# Patient Record
Sex: Female | Born: 1935 | Race: White | Hispanic: No | Smoking: Never smoker
Health system: Southern US, Community
[De-identification: ages and names within clinical notes are randomized; demographics above are authoritative.]

## PROBLEM LIST (undated history)

## (undated) DIAGNOSIS — H409 Unspecified glaucoma: Secondary | ICD-10-CM

## (undated) DIAGNOSIS — E119 Type 2 diabetes mellitus without complications: Secondary | ICD-10-CM

## (undated) DIAGNOSIS — E785 Hyperlipidemia, unspecified: Secondary | ICD-10-CM

## (undated) DIAGNOSIS — I509 Heart failure, unspecified: Secondary | ICD-10-CM

## (undated) DIAGNOSIS — I219 Acute myocardial infarction, unspecified: Secondary | ICD-10-CM

## (undated) DIAGNOSIS — M1990 Unspecified osteoarthritis, unspecified site: Secondary | ICD-10-CM

## (undated) DIAGNOSIS — C801 Malignant (primary) neoplasm, unspecified: Secondary | ICD-10-CM

## (undated) DIAGNOSIS — N189 Chronic kidney disease, unspecified: Secondary | ICD-10-CM

## (undated) DIAGNOSIS — J189 Pneumonia, unspecified organism: Secondary | ICD-10-CM

## (undated) DIAGNOSIS — I10 Essential (primary) hypertension: Secondary | ICD-10-CM

## (undated) DIAGNOSIS — R51 Headache: Secondary | ICD-10-CM

## (undated) DIAGNOSIS — R0602 Shortness of breath: Secondary | ICD-10-CM

## (undated) DIAGNOSIS — Z1231 Encounter for screening mammogram for malignant neoplasm of breast: Secondary | ICD-10-CM

## (undated) DIAGNOSIS — Z1239 Encounter for other screening for malignant neoplasm of breast: Secondary | ICD-10-CM

## (undated) DIAGNOSIS — Z780 Asymptomatic menopausal state: Secondary | ICD-10-CM

## (undated) DIAGNOSIS — D0510 Intraductal carcinoma in situ of unspecified breast: Secondary | ICD-10-CM

## (undated) DIAGNOSIS — C50919 Malignant neoplasm of unspecified site of unspecified female breast: Secondary | ICD-10-CM

## (undated) DIAGNOSIS — M79604 Pain in right leg: Secondary | ICD-10-CM

## (undated) DIAGNOSIS — N289 Disorder of kidney and ureter, unspecified: Secondary | ICD-10-CM

## (undated) DIAGNOSIS — R100 Acute abdomen: Secondary | ICD-10-CM

## (undated) DIAGNOSIS — M25362 Other instability, left knee: Secondary | ICD-10-CM

## (undated) DIAGNOSIS — R600 Localized edema: Secondary | ICD-10-CM

## (undated) HISTORY — PX: JOINT REPLACEMENT: SHX530

## (undated) HISTORY — PX: CORONARY ARTERY BYPASS GRAFT: SHX141

## (undated) HISTORY — PX: TOTAL HIP ARTHROPLASTY: SHX124

## (undated) HISTORY — PX: CHOLECYSTECTOMY: SHX55

## (undated) HISTORY — PX: OTHER SURGICAL HISTORY: SHX169

## (undated) HISTORY — PX: ABDOMINAL HYSTERECTOMY: SHX81

---

## 1994-09-23 DIAGNOSIS — I219 Acute myocardial infarction, unspecified: Secondary | ICD-10-CM

## 1994-09-23 HISTORY — DX: Acute myocardial infarction, unspecified: I21.9

## 2009-06-20 LAB — BASIC METABOLIC PANEL
BUN: 30 mg/dL — ABNORMAL HIGH (ref 6–23)
CO2: 33 mMol/L — ABNORMAL HIGH (ref 20–32)
Calcium: 9.7 mg/dL (ref 8.3–10.6)
Chloride: 103 mMol/L (ref 99–109)
Creatinine: 2 mg/dL — ABNORMAL HIGH (ref 0.6–1.1)
GFR African American: 29 mL/min/{1.73_m2}
GFR Non-African American: 24 mL/min/{1.73_m2}
Glucose: 223 mg/dL — ABNORMAL HIGH (ref 74–106)
Potassium: 4.3 mmol/L (ref 3.5–5.1)
Sodium: 141 mmol/L (ref 136–145)

## 2009-07-03 LAB — SEDIMENTATION RATE, MANUAL: Sed Rate, Automated: 11 mm/h (ref 0–30)

## 2009-07-05 LAB — CYCLIC CITRUL PEPTIDE ANTIBODY, IGG: CC Peptide,IgG Ab: 1

## 2009-07-05 LAB — RHEUMATOID FACTOR: Rheumatoid Factor: NEGATIVE

## 2009-09-25 LAB — BASIC METABOLIC PANEL
BUN: 29 mg/dL — ABNORMAL HIGH (ref 6–23)
CO2: 32 mMol/L (ref 20–32)
Calcium: 9.3 mg/dL (ref 8.3–10.6)
Chloride: 104 mMol/L (ref 99–109)
Creatinine: 1.4 mg/dL — ABNORMAL HIGH (ref 0.6–1.1)
GFR African American: 44 mL/min/{1.73_m2}
GFR Non-African American: 36 mL/min/{1.73_m2}
Glucose: 167 mg/dL — ABNORMAL HIGH (ref 74–106)
Potassium: 4 mmol/L (ref 3.5–5.1)
Sodium: 142 mmol/L (ref 136–145)

## 2009-10-23 ENCOUNTER — Inpatient Hospital Stay
Admit: 2009-10-23 | Disposition: A | Discharge status: Home / Self Care | Source: Other Acute Inpatient Hospital | Attending: Internal Medicine | Admitting: Internal Medicine

## 2009-10-23 LAB — CBC WITH AUTO DIFFERENTIAL
Basophils %: 3.5 % — ABNORMAL HIGH (ref 0–1)
Basophils Absolute: 0.2 10*3/uL — ABNORMAL HIGH (ref 0.0–0.1)
Eosinophils %: 2.8 % (ref 0–7)
Eosinophils Absolute: 0.2 10*3/uL (ref 0.0–0.6)
Hematocrit: 36 % — ABNORMAL LOW (ref 37.0–47.0)
Hemoglobin: 11.9 g/dL — ABNORMAL LOW (ref 12.0–16.0)
Lymphocytes %: 16.5 % (ref 16–42)
Lymphocytes Absolute: 1 10*3/uL (ref 0.6–4.3)
MCH: 31.9 pg — ABNORMAL HIGH (ref 27–31)
MCHC: 32.9 % (ref 32.0–36.0)
MCV: 97 FL (ref 80–100)
MPV: 9.9 FL (ref 7.5–11.1)
Monocytes %: 8 % (ref 4–12)
Monocytes Absolute: 0.5 10*3/uL (ref 0.2–1.1)
Neutrophils Absolute: 4.4 10*3/uL (ref 1.5–7.0)
Platelets: 155 10*3/uL (ref 150–400)
RBC: 3.71 10*6/uL — ABNORMAL LOW (ref 4.20–5.40)
RDW: 13.1 % (ref 11.7–14.9)
Segs Relative: 69.2 % (ref 44–76)
WBC: 6.3 10*3/uL (ref 4.8–10.8)

## 2009-10-23 LAB — COMPREHENSIVE METABOLIC PANEL
ALT: 14 U/L (ref 7–35)
AST: 50 U/L — ABNORMAL HIGH (ref 8–33)
Albumin,Serum: 4.2 g/dL (ref 3.4–4.8)
Alkaline Phosphatase: 50 U/L (ref 25–100)
BUN: 33 mg/dL — ABNORMAL HIGH (ref 6–23)
CO2: 29 mMol/L (ref 20–32)
Calcium: 9 mg/dL (ref 8.3–10.6)
Chloride: 103 mMol/L (ref 99–109)
Creatinine: 1.5 mg/dL — ABNORMAL HIGH (ref 0.6–1.1)
GFR African American: 41 mL/min/{1.73_m2}
GFR Non-African American: 34 mL/min/{1.73_m2}
Glucose, GTT - Fasting: 113 mg/dL — ABNORMAL HIGH (ref 50–99)
Potassium: 5 mmol/L (ref 3.5–5.1)
Sodium: 140 mmol/L (ref 136–145)
Total Bilirubin: 0.5 mg/dL (ref 0.3–1.2)
Total Protein: 6.5 g/dL (ref 6.4–8.3)

## 2009-10-23 LAB — TROPONIN
Troponin I: 0.009 ng/mL (ref 0.000–0.040)
Troponin I: 0.006 ng/mL (ref 0.000–0.040)

## 2009-10-24 LAB — POCT GLUCOSE
POC Glucose: 214 mg/dL — ABNORMAL HIGH (ref 70–105)
POC Glucose: 416 mg/dL — ABNORMAL HIGH (ref 70–105)
POC Glucose: 271 mg/dL — ABNORMAL HIGH (ref 70–105)
POC Glucose: 408 mg/dL — ABNORMAL HIGH (ref 70–105)

## 2009-10-24 LAB — CBC
Hematocrit: 32.4 % — ABNORMAL LOW (ref 37.0–47.0)
Hemoglobin: 10.8 g/dL — ABNORMAL LOW (ref 12.0–16.0)
MCH: 32.3 pg — ABNORMAL HIGH (ref 27–31)
MCHC: 33.2 % (ref 32.0–36.0)
MCV: 97.2 FL (ref 80–100)
MPV: 10.5 FL (ref 7.5–11.1)
Platelets: 125 10*3/uL — ABNORMAL LOW (ref 150–400)
RBC: 3.33 10*6/uL — ABNORMAL LOW (ref 4.20–5.40)
RDW: 14.4 % (ref 11.7–14.9)
WBC: 4.8 10*3/uL (ref 4.8–10.8)

## 2009-10-24 LAB — BASIC METABOLIC PANEL
BUN: 33 mg/dL — ABNORMAL HIGH (ref 6–23)
CO2: 29 mMol/L (ref 20–32)
Calcium: 9.2 mg/dL (ref 8.3–10.6)
Chloride: 106 mMol/L (ref 99–109)
Creatinine: 1.8 mg/dL — ABNORMAL HIGH (ref 0.6–1.1)
GFR African American: 33 mL/min/{1.73_m2}
GFR Non-African American: 27 mL/min/{1.73_m2}
Glucose: 140 mg/dL — ABNORMAL HIGH (ref 74–106)
Potassium: 4.2 mmol/L (ref 3.5–5.1)
Sodium: 140 mmol/L (ref 136–145)

## 2009-10-24 LAB — TROPONIN: Troponin I: 0.009 ng/mL (ref 0.000–0.040)

## 2009-10-24 LAB — PROTIME/INR & PTT
INR: 1.08 INDEX
Prothrombin Time: 11.8 s (ref 10–14.3)
aPTT: 29.4 s (ref 24–40)

## 2009-10-25 LAB — CBC
Hematocrit: 28.8 % — ABNORMAL LOW (ref 37.0–47.0)
Hemoglobin: 9.8 g/dL — ABNORMAL LOW (ref 12.0–16.0)
MCH: 32.9 pg — ABNORMAL HIGH (ref 27–31)
MCHC: 34.2 % (ref 32.0–36.0)
MCV: 96.3 FL (ref 80–100)
MPV: 10.8 FL (ref 7.5–11.1)
Platelets: 118 10*3/uL — ABNORMAL LOW (ref 150–400)
RBC: 2.99 10*6/uL — ABNORMAL LOW (ref 4.20–5.40)
RDW: 14.4 % (ref 11.7–14.9)
WBC: 12.2 10*3/uL — ABNORMAL HIGH (ref 4.8–10.8)

## 2009-10-25 LAB — POCT GLUCOSE
POC Glucose: 459 mg/dL — ABNORMAL HIGH (ref 70–105)
POC Glucose: 273 mg/dL — ABNORMAL HIGH (ref 70–105)
POC Glucose: 127 mg/dL — ABNORMAL HIGH (ref 70–105)
POC Glucose: 313 mg/dL — ABNORMAL HIGH (ref 70–105)
POC Glucose: 178 mg/dL — ABNORMAL HIGH (ref 70–105)

## 2009-10-25 LAB — BASIC METABOLIC PANEL
BUN: 37 mg/dL — ABNORMAL HIGH (ref 6–23)
CO2: 30 mMol/L (ref 20–32)
Calcium: 8.8 mg/dL (ref 8.3–10.6)
Chloride: 106 mMol/L (ref 99–109)
Creatinine: 1.9 mg/dL — ABNORMAL HIGH (ref 0.6–1.1)
GFR African American: 31 mL/min/{1.73_m2}
GFR Non-African American: 26 mL/min/{1.73_m2}
Glucose: 101 mg/dL (ref 74–106)
Potassium: 4.5 mmol/L (ref 3.5–5.1)
Sodium: 139 mmol/L (ref 136–145)

## 2009-10-26 LAB — POCT GLUCOSE
POC Glucose: 143 mg/dL — ABNORMAL HIGH (ref 70–105)
POC Glucose: 222 mg/dL — ABNORMAL HIGH (ref 70–105)
POC Glucose: 77 mg/dL (ref 70–105)
POC Glucose: 125 mg/dL — ABNORMAL HIGH (ref 70–105)
POC Glucose: 222 mg/dL — ABNORMAL HIGH (ref 70–105)

## 2009-10-26 LAB — BASIC METABOLIC PANEL
BUN: 35 mg/dL — ABNORMAL HIGH (ref 6–23)
CO2: 28 mMol/L (ref 20–32)
Calcium: 8.6 mg/dL (ref 8.3–10.6)
Chloride: 109 mMol/L (ref 99–109)
Creatinine: 1.7 mg/dL — ABNORMAL HIGH (ref 0.6–1.1)
GFR African American: 35 mL/min/{1.73_m2}
GFR Non-African American: 29 mL/min/{1.73_m2}
Glucose: 61 mg/dL — ABNORMAL LOW (ref 74–106)
Potassium: 3.6 mmol/L (ref 3.5–5.1)
Sodium: 141 mmol/L (ref 136–145)

## 2009-10-27 LAB — POCT GLUCOSE
POC Glucose: 84 mg/dL (ref 70–105)
POC Glucose: 165 mg/dL — ABNORMAL HIGH (ref 70–105)
POC Glucose: 73 mg/dL (ref 70–105)

## 2009-10-28 LAB — POCT GLUCOSE: POC Glucose: 80 mg/dL (ref 70–105)

## 2009-11-07 LAB — BASIC METABOLIC PANEL
BUN: 28 mg/dL — ABNORMAL HIGH (ref 6–23)
CO2: 31 mMol/L (ref 20–32)
Calcium: 9.1 mg/dL (ref 8.3–10.6)
Chloride: 101 mMol/L (ref 99–109)
Creatinine: 1.6 mg/dL — ABNORMAL HIGH (ref 0.6–1.1)
GFR African American: 38 mL/min/{1.73_m2}
GFR Non-African American: 31 mL/min/{1.73_m2}
Glucose: 254 mg/dL — ABNORMAL HIGH (ref 74–106)
Potassium: 4.3 mmol/L (ref 3.5–5.1)
Sodium: 141 mmol/L (ref 136–145)

## 2010-04-20 LAB — CBC WITH AUTO DIFFERENTIAL
Basophils %: 1.7 % — ABNORMAL HIGH (ref 0–1)
Basophils Absolute: 0.2 10*3/uL — ABNORMAL HIGH (ref 0.0–0.1)
Eosinophils %: 2 % (ref 0–7)
Eosinophils Absolute: 0.2 10*3/uL (ref 0.0–0.6)
Hematocrit: 35.2 % — ABNORMAL LOW (ref 37.0–47.0)
Hemoglobin: 12 GM/DL (ref 12.0–16.0)
Lymphocytes %: 8.7 % — ABNORMAL LOW (ref 16–42)
Lymphocytes Absolute: 0.8 10*3/uL (ref 0.6–4.3)
MCH: 32.5 PG — ABNORMAL HIGH (ref 27–31)
MCHC: 34.2 % (ref 32.0–36.0)
MCV: 95.2 FL (ref 80–100)
MPV: 9 FL (ref 7.5–11.1)
Monocytes %: 7.2 % (ref 4–12)
Monocytes Absolute: 0.6 10*3/uL (ref 0.2–1.1)
Platelets: 186 10*3/uL (ref 150–400)
RBC: 3.7 10*6/uL — ABNORMAL LOW (ref 4.20–5.40)
RDW: 12.1 % (ref 11.7–14.9)
Segs Absolute: 7.1 10*3/uL — ABNORMAL HIGH (ref 1.5–7.0)
Segs Relative: 80.4 % — ABNORMAL HIGH (ref 44–76)
WBC: 8.9 10*3/uL (ref 4.8–10.8)

## 2010-04-20 LAB — COMPREHENSIVE METABOLIC PANEL
ALT: 14 U/L (ref 7–35)
AST: 24 U/L (ref 8–33)
Albumin,Serum: 4.2 G/DL (ref 3.4–4.8)
Alkaline Phosphatase: 49 U/L (ref 25–100)
BUN: 30 mg/dL — ABNORMAL HIGH (ref 6–23)
Bilirubin, Total: 0.6 mg/dL (ref 0.3–1.2)
CO2: 27 mMol/L (ref 20–32)
Calcium: 9 mg/dL (ref 8.3–10.6)
Chloride: 101 mMol/L (ref 99–109)
Creatinine: 1.7 mg/dL — ABNORMAL HIGH (ref 0.6–1.1)
GFR African American: 35 mL/min/{1.73_m2}
GFR Non-African American: 29 mL/min/{1.73_m2}
Glucose, GTT - Fasting: 174 MG/DL — ABNORMAL HIGH (ref 50–99)
Potassium: 4.1 MMOL/L (ref 3.5–5.1)
Sodium: 137 mmol/L (ref 136–145)
Total Protein: 6.5 g/dL (ref 6.4–8.3)

## 2010-04-20 LAB — URINALYSIS
Bilirubin Urine: NEGATIVE MG/DL
Blood, Urine: NEGATIVE
Cast Type: NONE SEEN /LPF
Crystal Type: NONE SEEN /HPF
Epithelial Cells, UA: 3 /HPF
Glucose, Urine: NEGATIVE MG/DL
Ketones, Urine: NEGATIVE MG/DL
Leukocyte Esterase, Urine: NEGATIVE
Nitrite Urine, Quantitative: NEGATIVE
Protein, UA: NEGATIVE MG/DL
RBC, UA: NONE SEEN /HPF (ref 0–6)
Specific Gravity, UA: 1.025 (ref 1.001–1.035)
Urobilinogen, Urine: 0.2 EU/DL (ref 0.2–1.0)
Volume, (UVOL): 12 ML (ref 10–12)
WBC, UA: 2 /HPF (ref 0–5)
pH, Urine: 5.5 (ref 5.0–8.0)

## 2010-04-20 LAB — TROPONIN: Troponin I: 0.016 NG/ML (ref 0.000–0.040)

## 2010-04-20 LAB — CREATINE KINASE: Total CK: 45 U/L (ref 33–211)

## 2010-08-24 ENCOUNTER — Ambulatory Visit: Payer: Self-pay | Admitting: Family Medicine

## 2010-08-24 DIAGNOSIS — H409 Unspecified glaucoma: Secondary | ICD-10-CM | POA: Insufficient documentation

## 2010-08-24 DIAGNOSIS — I2510 Atherosclerotic heart disease of native coronary artery without angina pectoris: Secondary | ICD-10-CM | POA: Insufficient documentation

## 2010-08-24 DIAGNOSIS — I498 Other specified cardiac arrhythmias: Secondary | ICD-10-CM | POA: Insufficient documentation

## 2010-08-24 DIAGNOSIS — I10 Essential (primary) hypertension: Secondary | ICD-10-CM | POA: Insufficient documentation

## 2010-08-24 DIAGNOSIS — E785 Hyperlipidemia, unspecified: Secondary | ICD-10-CM | POA: Insufficient documentation

## 2010-08-24 DIAGNOSIS — E119 Type 2 diabetes mellitus without complications: Secondary | ICD-10-CM | POA: Insufficient documentation

## 2010-08-24 LAB — CONVERTED CEMR LAB: Blood Glucose, Fingerstick: 86

## 2010-08-25 ENCOUNTER — Encounter: Payer: Self-pay | Admitting: Family Medicine

## 2010-09-23 DIAGNOSIS — C801 Malignant (primary) neoplasm, unspecified: Secondary | ICD-10-CM

## 2010-09-23 HISTORY — DX: Malignant (primary) neoplasm, unspecified: C80.1

## 2010-10-23 NOTE — Letter (Signed)
Summary: recommendations from referral-dr. covert  recommendations from referral-dr. covert   Imported By: Rosine Beat 08/25/2010 13:43:15  _____________________________________________________________________  External Attachment:    Type:   Image     Comment:   External Document

## 2010-10-23 NOTE — Assessment & Plan Note (Signed)
Summary: POSSIBLE PINK EYE/JBB   Vital Signs:  Patient Profile:   77 Years Old Female CC:      Possible Pink Eye Height:     58 inches Weight:      124 pounds BMI:     26.01 O2 Sat:      97 % O2 treatment:    Room Air Temp:     97.0 degrees F oral Pulse rate:   52 / minute Pulse rhythm:   regular Resp:     20 per minute BP sitting:   159 / 60  (left arm)  Pt. in pain?   no  Vitals Entered By: Levonne Spiller EMT-P (August 24, 2010 1:33 PM)              Is Patient Diabetic? Yes  CBG Result 86  Does patient need assistance? Functional Status Self care Ambulation Normal     Serial Vital Signs/Assessments:  Time      Position  BP       Pulse  Resp  Temp     By 2:18pm              160/60   50     18             Clanford Johnson MD 2:28pm              170/56   52     18             Clanford Johnson MD 3:02pm              116/62   54     18             Clanford Johnson MD  Comments: We monitored the patient in the office continuously over the course of 2+ hours, we also monitored her blood glucose and pulse.  By: Standley Dakins MD    Current Allergies: ! * CELABREXHistory of Present Illness History from: patient Reason for visit: see chief complaint Chief Complaint: Possible Pink Eye History of Present Illness: Yesterday pt noticed that her left eye was red and it had some drainage.   the patient says that her daughter noticed initially that her eye was red. Mrs. the left eye that she has been experiencing these symptoms in. She reports that she has poor vision in the left eye from when she was a child. He reports that it was considered a lazy eye when she was a child and she was not able to get it corrected when she was a child. She reports that she thought that she was having a pink eye. She had a little bit of irritation in that eye but otherwise she reports that she felt no pain. She reports that she was diagnosed with glaucoma recently and has been taking eyedrops  regularly. She also has hypertension. She reports that her blood pressures have been regularly controlled but she did notice that for the past several days she has had higher systolic blood pressure readings. She reports that this morning her blood pressure was 151 systolic. She says that normally her systolic blood pressures are between 1:30 and 135. And she has diastolic pressure readings that are less than 70 on most occasions. She reports that she is taking several blood pressure medications as she reports that she does take them regularly. She did take her blood pressure medications this morning. She reports that she is visiting her daughter who lives  here in [facility] but she is from [place] and that's where her doctors are.the patient says that she has a cardiologist in [place]. She reports that in September of this year she had a full cardiac workup in [place]. She reports that she had a catheterization that was done. She says that her heart was okay. She reports that she has had intermittent chest pain but nothing recently. She has been in [place] since October of this year. She reports that she has not had any problem since then. The patient reports that she has not had any allergies or runny nose or coughing recently. She says that she is taking her meds for glaucoma. no eye pain.  REVIEW OF SYSTEMS Constitutional Symptoms       Complains of fatigue.     Denies fever, chills, night sweats, weight loss, and weight gain.  Eyes       Complains of eye drainage and glasses.      Denies change in vision, eye pain, contact lenses, and eye surgery. Ear/Nose/Throat/Mouth       Complains of hearing loss/aids, dizziness, and sinus problems.      Denies change in hearing, ear pain, ear discharge, frequent runny nose, frequent nose bleeds, sore throat, hoarseness, and tooth pain or bleeding.  Respiratory       Denies dry cough, productive cough, wheezing, shortness of breath, asthma,  bronchitis, and emphysema/COPD.  Cardiovascular       Complains of tires easily with exhertion.      Denies murmurs and chest pain.    Gastrointestinal       Complains of constipation.      Denies stomach pain, nausea/vomiting, diarrhea, blood in bowel movements, and indigestion. Genitourniary       Denies painful urination, kidney stones, and loss of urinary control. Neurological       Denies paralysis, seizures, and fainting/blackouts. Musculoskeletal       Complains of joint stiffness.      Denies muscle pain, joint pain, decreased range of motion, redness, swelling, muscle weakness, and gout.  Skin       Complains of bruising.      Denies unusual mles/lumps or sores and hair/skin or nail changes.  Psych       Denies mood changes, temper/anger issues, anxiety/stress, speech problems, depression, and sleep problems. Blood-Lymph       Complains of easily bruises or bleeds.  Past History:  Family History: Last updated: 08/24/2010 the patient reports that she had one sister that died of cancer. She reports that her parents died of old age.  Social History: Last updated: 08/24/2010 this patient lives in [place] but is currently in [place] visiting her daughter and will be here for approximately 6 months. She reports that she uses no alcohol, tobacco or recreational drugs.  Past Medical History: hypertension Hyperlipidemia Coronary artery disease status post three-vessel CABG Hyperlipidemia Glaucoma Osteoarthritis Diabetes mellitus type 2  Past Surgical History: three-vessel coronary artery bypass, 3 cesarean sections, 3 hip replacements, 1 new replacement and hysterectomy.  Family History: the patient reports that she had one sister that died of cancer. She reports that her parents died of old age.  Social History: this patient lives in [place] but is currently in [place] visiting her daughter and will be here for approximately 6 months. She reports that she uses no  alcohol, tobacco or recreational drugs. Physical Exam General appearance: well developed, well nourished, no acute distress Head: normocephalic, atraumatic Eyes:  left scleral irritation and subconjunctival hemorrhage, right eye OK Pupils: equal, round, reactive to light Ears: normal, no lesions or deformities Nasal: mucosa pink, nonedematous, no septal deviation, turbinates normal Oral/Pharynx: tongue normal, posterior pharynx without erythema or exudate Neck: neck supple,  trachea midline, no masses Thyroid: no nodules, masses, tenderness, or enlargement Chest/Lungs: no rales, wheezes, or rhonchi bilateral, breath sounds equal without effort Heart: regular rate and  rhythm, no murmur Abdomen: soft, non-tender without obvious organomegaly Extremities: normal extremities Neurological: grossly intact and non-focal Skin: no obvious rashes or lesions MSE: oriented to time, place, and person Assessment New Problems: SUBCONJUNCTIVAL HEMORRHAGE, LEFT (ICD-372.72) GLAUCOMA STAGE UNSPECIFIED (ICD-365.70) HYPERLIPIDEMIA (ICD-272.4) DIABETES MELLITUS, TYPE II, CONTROLLED (ICD-250.00) BRADYCARDIA (ICD-427.89) CORONARY ATHEROSCLEROSIS NATIVE CORONARY ARTERY (ICD-414.01) HYPERTENSION, ESSENTIAL, UNCONTROLLED (ICD-401.9)   Patient Education: Patient and/or caregiver instructed in the following: rest. The risks, benefits and possible side effects were clearly explained and discussed with the patient.  The patient verbalized clear understanding.  The patient was given instructions to return if symptoms don't improve, worsen or new changes develop.  If it is not during clinic hours and the patient cannot get back to this clinic then the patient was told to seek medical care at an available urgent care or emergency department.  The patient verbalized understanding.   Demonstrates willingness to comply.  Plan Planning Comments:   Recommended frequent blood pressure monitoring.  Also, recommended that  the patient go directly to the eye care specialist to be sure the glaucoma is controlled.  In addition, recommended patient avoid sodium and eat a meal.  I recommended that the patient increase her hydralazine 10 mg tabs to 3 tabs by mouth two times a day and monitor BP closely and follow up with her PCP and cardiologist to let them know about the rise in her blood pressure.  Also, I asked her to check her pulse and if her pulse drops below 50 I told her that she needed to seek medical care urgently because that could cause her to pass out.  The patient verbalized clear understanding.   Follow Up: Follow up in 2-3 days if no improvement, Follow up on an as needed basis, Follow up with Primary Physician Follow Up: see instructions as noted  The patient and/or caregiver has been counseled thoroughly with regard to medications prescribed including dosage, schedule, interactions, rationale for use, and possible side effects and they verbalize understanding.  Diagnoses and expected course of recovery discussed and will return if not improved as expected or if the condition worsens. Patient and/or caregiver verbalized understanding.   Patient Instructions: 1)  Check your blood sugars regularly. If your readings are usually above 220 or below 70 you should contact our office. 2)  See your eye doctor yearly to check for diabetic eye damage. 3)  Check your Blood Pressure regularly. If it is above:140/90 you should make an appointment. 4)  Increase your hydralazine to 3 (10mg ) tabs by mouth two times a day instead of 2 tabs by mouth two times a day. 5)  Please come have your blood pressure rechecked in 3 days. 6)  If your BP goes up again over 140/90, please go to the ER.  7)  The patient was informed that there is no on-call provider or services available at this clinic during off-hours (when the clinic is closed).  If the patient developed a problem or concern that required immediate attention, the patient was  advised to go the the nearest available urgent care or  emergency department for medical care.  The patient verbalized understanding.    8)  It was clearly explained to the patient that this [facility] is not intended to be a primary care clinic if you have your own provider.  The patient is always better served by the continuity of care and the provider/patient relationships developed with their dedicated primary care provider.  The patient was told to be sure to follow up as soon as possible with their primary care provider to discuss treatments received and to receive further examination and testing.  The patient verbalized understanding. The will f/u with PCP ASAP.    The patient was monitored in the office for 2 hours and had serial blood pressure assessments and had clonidine 0.1 mg given in 2 separate doses and also, I contacted the optometrist in the store and asked if he would also see the patient and evaluate her subconjunctival hemorrage.  He said that he would do see her as a work-in and to have her to come down to the office to be seen.  I want for the patient's ocular pressures to be tested and to be sure that her glaucoma is controlled. I explained to that patient that she should call 911 if she developed chest pain or SOB or if her vision suddenly worsens or if she were to have neurological signs of a possible stroke.  She verbalized understanding.  I gave her instructions to increase her hydralazine medication to 3 tabs by mouth two times a day and to continue to monitor her BP at home and if it is not coming down she needs to seek medical care immediately. The patient verbalized clear understanding.     Medication Administration  Medication # 1:    Medication: Clonidine 0.1mg  tab    Diagnosis: HYPERTENSION, ESSENTIAL, UNCONTROLLED (ICD-401.9)    Dose: 1 tablet    Route: po    Exp Date: 09/23/2011    Lot #: [id number]    Mfr: Mylan Rx    Patient tolerated medication without  complications    Given by: Levonne Spiller EMT-P (August 24, 2010 2:13 PM)  Medication # 2:    Medication: Clonidine 0.1mg  tab    Diagnosis: HYPERTENSION, ESSENTIAL, UNCONTROLLED (ICD-401.9)    Dose: 1 tablet    Route: po    Exp Date: 09/23/2011    Lot #: [id number]    Mfr: Mylan Rx    Patient tolerated medication without complications

## 2010-11-26 ENCOUNTER — Ambulatory Visit: Payer: Medicare HMO | Admitting: Family Medicine

## 2011-03-21 LAB — BASIC METABOLIC PANEL
BUN: 38 mg/dL — ABNORMAL HIGH (ref 6–23)
CO2: 29 mMol/L (ref 20–32)
Calcium: 8.9 mg/dL (ref 8.3–10.6)
Chloride: 106 mMol/L (ref 99–109)
Creatinine: 1.5 mg/dL — ABNORMAL HIGH (ref 0.6–1.1)
GFR African American: 41 mL/min/{1.73_m2}
GFR Non-African American: 33 mL/min/{1.73_m2}
Glucose: 176 MG/DL — ABNORMAL HIGH (ref 70–140)
Potassium: 4 MMOL/L (ref 3.5–5.1)
Sodium: 140 mmol/L (ref 136–145)

## 2011-03-21 LAB — PHOSPHORUS: Phosphorus: 3.8 MG/DL (ref 2.7–4.5)

## 2011-04-30 MED ADMIN — regadenoson (LEXISCAN) injection SOLN 0.4 mg: INTRAVENOUS | @ 14:00:00 | NDC 00469650189

## 2011-04-30 MED ADMIN — technetium sestamibi (CARDIOLITE) injection 10 milli Curie: INTRAVENOUS | @ 12:00:00

## 2011-04-30 MED ADMIN — aminophylline injection 75 mg: INTRAVENOUS | @ 14:00:00 | NDC 00409592201

## 2011-04-30 MED FILL — LEXISCAN 0.4 MG/5ML IV SOLN: 0.4 MG/5ML | INTRAVENOUS | Qty: 5

## 2011-04-30 MED FILL — AMINOPHYLLINE 25 MG/ML IV SOLN: 25 MG/ML | INTRAVENOUS | Qty: 20

## 2011-04-30 MED FILL — NORMAL SALINE FLUSH 0.9 % IJ SOLN: 0.9 % | INTRAMUSCULAR | Qty: 10

## 2011-04-30 MED FILL — TECHNETIUM TC 99M SESTAMIBI - CARDIOLITE: Qty: 10

## 2011-05-13 ENCOUNTER — Encounter

## 2011-05-15 LAB — CBC WITH AUTO DIFFERENTIAL
Basophils %: 0.2 % (ref 0–1)
Basophils Absolute: 0 10*3/uL (ref 0.0–0.1)
Eosinophils %: 1.8 % (ref 0–7)
Eosinophils Absolute: 0.1 10*3/uL (ref 0.0–0.6)
Hematocrit: 38.2 % (ref 37.0–47.0)
Hemoglobin: 13.2 GM/DL (ref 12.0–16.0)
Lymphocytes %: 18.9 % (ref 16–42)
Lymphocytes Absolute: 1.1 10*3/uL (ref 0.6–4.3)
MCH: 32.9 PG — ABNORMAL HIGH (ref 27–31)
MCHC: 34.5 % (ref 32.0–36.0)
MCV: 95.2 FL (ref 80–100)
MPV: 9.5 FL (ref 7.5–11.1)
Monocytes %: 11.5 % (ref 4–12)
Monocytes Absolute: 0.7 10*3/uL (ref 0.2–1.1)
Platelets: 172 10*3/uL (ref 150–400)
RBC: 4.01 10*6/uL — ABNORMAL LOW (ref 4.20–5.40)
RDW: 13.3 % (ref 11.7–14.9)
Segs Absolute: 3.9 10*3/uL (ref 1.5–7.0)
Segs Relative: 67.6 % (ref 44–76)
WBC: 5.8 10*3/uL (ref 4.8–10.8)

## 2011-05-15 LAB — BASIC METABOLIC PANEL
BUN: 30 mg/dL — ABNORMAL HIGH (ref 6–23)
CO2: 31 mMol/L (ref 20–32)
Calcium: 9.4 mg/dL (ref 8.3–10.6)
Chloride: 102 mMol/L (ref 99–109)
Creatinine: 1.5 mg/dL — ABNORMAL HIGH (ref 0.6–1.1)
GFR African American: 41 mL/min/{1.73_m2}
GFR Non-African American: 33 mL/min/{1.73_m2}
Glucose: 95 MG/DL (ref 70–140)
Potassium: 4 MMOL/L (ref 3.5–5.1)
Sodium: 139 mmol/L (ref 136–145)

## 2011-05-15 NOTE — Patient Instructions (Signed)
See scanned instruction form , instructed to take Hydralazine 10 mg, Coreg 6.25 mg with just a sip of water only the morning of surgery per anesthesia

## 2011-05-20 ENCOUNTER — Inpatient Hospital Stay: Attending: Surgery

## 2011-05-20 LAB — POCT GLUCOSE
POC Glucose: 136 MG/DL — ABNORMAL HIGH (ref 70–99)
POC Glucose: 127 MG/DL — ABNORMAL HIGH (ref 70–99)

## 2011-05-20 MED ORDER — LIDOCAINE HCL (PF) 1 % IJ SOLN
1 | Freq: Once | INTRAMUSCULAR | Status: DC | PRN
Start: 2011-05-20 — End: 2011-05-20

## 2011-05-20 MED ORDER — DIPHENHYDRAMINE HCL 50 MG/ML IJ SOLN
50 | Freq: Once | INTRAMUSCULAR | Status: DC | PRN
Start: 2011-05-20 — End: 2011-05-20

## 2011-05-20 MED ORDER — LABETALOL HCL 5 MG/ML IV SOLN
5 | INTRAVENOUS | Status: DC | PRN
Start: 2011-05-20 — End: 2011-05-20

## 2011-05-20 MED ORDER — ONDANSETRON HCL 4 MG/2ML IJ SOLN
4 | Freq: Once | INTRAMUSCULAR | Status: AC | PRN
Start: 2011-05-20 — End: 2011-05-20

## 2011-05-20 MED ORDER — HYDROCODONE-ACETAMINOPHEN 5-500 MG PO TABS
5-500 MG | ORAL_TABLET | ORAL | Status: AC
Start: 2011-05-20 — End: 2011-05-27

## 2011-05-20 MED ADMIN — morphine injection 2 mg: 2 mg | INTRAVENOUS | @ 15:00:00 | NDC 00641607001

## 2011-05-20 MED ADMIN — ondansetron (ZOFRAN) injection 4 mg: INTRAVENOUS | @ 16:00:00 | NDC 00781301072

## 2011-05-20 MED ADMIN — promethazine (PHENERGAN) injection 6.25 mg: INTRAVENOUS | @ 17:00:00 | NDC 00641092821

## 2011-05-20 MED ADMIN — lactated ringers infusion: INTRAVENOUS | @ 14:00:00 | NDC 00338011704

## 2011-05-20 MED ADMIN — ceFAZolin 1 gram (ANCEF) IVPB: INTRAVENOUS | @ 14:00:00 | NDC 00338350341

## 2011-05-20 MED FILL — MORPHINE SULFATE 10 MG/ML IJ SOLN: 10 mg/mL | INTRAMUSCULAR | Qty: 1

## 2011-05-20 MED FILL — CEFAZOLIN SODIUM 1 G IJ SOLR: 1 g | INTRAMUSCULAR | Qty: 1000

## 2011-05-20 MED FILL — LIDOCAINE HCL 1 % IJ SOLN: 1 % | INTRAMUSCULAR | Qty: 20

## 2011-05-20 MED FILL — LACTATED RINGERS IV SOLN: INTRAVENOUS | Qty: 1000

## 2011-05-20 MED FILL — ONDANSETRON HCL 4 MG/2ML IJ SOLN: 4 MG/2ML | INTRAMUSCULAR | Qty: 2

## 2011-05-20 MED FILL — PROMETHAZINE HCL 25 MG/ML IJ SOLN: 25 MG/ML | INTRAMUSCULAR | Qty: 1

## 2011-05-20 NOTE — Progress Notes (Signed)
Sleeping.

## 2011-05-20 NOTE — Anesthesia Post-Procedure Evaluation (Signed)
Anesthesia type: General     Patient location: PACU     Post-op pain: Adequate analgesia     Post-op assessment: no apparent anesthetic complications, tolerated procedure well and no evidence of recall       Post-op vital signs: stable     Level of consciousness: awake, alert and oriented     Complications: none

## 2011-05-20 NOTE — Plan of Care (Signed)
Diagnosis- Knowledge deficit related to procedure as evidenced by patient asking questions.  Knowledge deficit related to pre-procedure routine.  Expected Outcome - Verbalizes understanding of procedure and necessary preparations.  Nursing Actions - Pre-operative teaching, including NPO and printed pre-procedure instruction sheet.  Verbalizes understanding of pre-procedure teaching.    Nursing Diagnosis - Potential for anxiety related to lack of knowledge of procedure.  Expected Outcome - Demonstrates decreased anxiety.  Nursing Actions - Give clear, concise explanations.  Keep other caregivers informed.  Convey caring , supportive attitude.    Nursing Diagnosis - Potential for injury.  Expected Outcome - Patient will remain injury free.  Nursing Actions - Complete patient assessment. Provide monitoring as indicated. Place nursing call light within reach.  Secure safety straps/siderails during procedure.  Provide printed post-procedure instructions to out-patients.    I have reviewed each nursing diagnosis, expected outcome and nursing actions.  All apply to this patient.  Each goal has been met on this date.

## 2011-05-20 NOTE — Progress Notes (Signed)
States discomfort the same right breast. Medicated. Ice chips given.

## 2011-05-20 NOTE — Progress Notes (Signed)
Accu check 127.

## 2011-05-20 NOTE — Progress Notes (Signed)
Received from or. Report obtained from Roslynn Amble CRNA. Alarms on. LR infusing.

## 2011-05-20 NOTE — H&P (Signed)
Vernie Murders, MD H&P    PATIENT NAME: Judy Velasquez   DATE OF BIRTH: 10/18/1928    ADMISSION DATE: 05/20/2011.   TODAY'S DATE: 05/20/2011    CHIEF COMPLAINT:  DCIS right breast      HISTORY OF PRESENT ILLNESS:  The patient is a 77 y.o. 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 She is here for a lumpectomy.    Past Medical History:        Diagnosis Date   . Osteoarthritis    . Hyperlipidemia    . Hypertension    . Anemia    . Type II or unspecified type diabetes mellitus without mention of complication, not stated as uncontrolled Dx 1980's   . Right Breast Cancer Dx 7-12   . Back problem      "Occ Back Hurts"   . CAD (coronary artery disease)      Sees Dr. Marcelino Freestone   . Allergic rhinitis    . Glaucoma      Bilateral Eyes   . Osteoporosis    . Heart attack 2006   . TB (tuberculosis)      "Probably had it as a child, my dad had it"   . Shortness of breath on exertion    . Blood transfusion    . Nausea & vomiting      Nausea/Vomiting Post Op In Past   . Anesthesia      Nausea/Vomiting Post Op In Past   . IBS (irritable bowel syndrome)    . Kidney problem      "Sees  Dr. Wallis Bamberg For Blood Pressure And Kidneys"   . Anxiety    . Staph infection 1980's     "They sent me to a tropical infection doctor"   . HOH (hard of hearing)      Bilateral Ears       Past Surgical History:        Procedure Date   . Cesarean section "Late 1950's or Early 1960's"     X 3   . Carpal tunnel release 2000's     Right   . Breast surgery 7-12     Right Breast Biopsy   . Joint replacement 1980's     Total Right Hip   . Joint replacement 2000's     Total Right Hip   . Joint replacement 1990's     Total Left Hip   . Joint replacement 1980's     Total Right Knee   . Other surgical history      Family Physician Is Dr. Berneice Heinrich In [place], [place]   . Hysterectomy, total abdominal 1960's   . Cardiac surgery 2006     CABG (3 Bypasses)   . Other surgical history 2000's     "Took section of my intestines out, it was a  exploratory operation, they found scar tissue from the c-sections"   . Colonoscopy 2000's   . Endoscopy, colon, diagnostic 1990's   . Tonsillectomy and adenoidectomy 1939   . Cholecystectomy, laparoscopic 1990's       Medications Prior to Admission:   Prescriptions prior to admission:Acetaminophen (TYLENOL PO), Take  by mouth as needed. Over The Counter   diltiazem (TIAZAC) 240 MG SR capsule, Take 240 mg by mouth every evening.    estradiol (ESTRACE) 0.5 MG tablet, Take 0.5 mg by mouth every morning.    Folic Acid-Vit B6-Vit B12 (FOLBEE PO), Take  by mouth nightly.  Polysaccharide Iron Complex (FERREX 150 PO), Take  by mouth nightly.    alendronate (FOSAMAX) 70 MG tablet, Take 70 mg by mouth every 7 days. Sunday Morning   Naphazoline-Pheniramine (NAPHCON-A OP), Apply  to eye as needed. Over The Counter   Polyethylene Glycol 3350 GRAN, by Does not apply route.    latanoprost (XALATAN) 0.005 % ophthalmic solution, Place 1 drop into both eyes nightly.  glipiZIDE (GLUCOTROL) 10 MG tablet, Take 10 mg by mouth every morning.  fexofenadine (ALLEGRA) 180 MG tablet, Take 180 mg by mouth every morning.  pravastatin (PRAVACHOL) 10 MG tablet, Take 10 mg by mouth nightly.  furosemide (LASIX) 40 MG tablet, Take 40 mg by mouth 2 times daily.  carvedilol (COREG) 6.25 MG tablet, Take 6.25 mg by mouth 2 times daily.  hydrALAZINE (APRESOLINE) 10 MG tablet, Take 10 mg by mouth 2 times daily. Two Tablets Twice Daily    Allergies:  Celebrex; Lisinopril; Neurontin; Norvasc; Other; Ciprofloxacin; and Lipitor    Social History:   TOBACCO:   reports that she has never smoked. She has never used smokeless tobacco.  ETOH:   reports that she does not drink alcohol.  DRUGS:   reports that she does not use illicit drugs.  MARITAL STATUS:    OCCUPATION:    Patient currently lives alone    Family History:       Problem Relation Age of Onset   . Heart Disease Mother    . Cancer Mother      "Cancer In Her Eye"   . Depression Mother    . Mental  Illness Mother    . Diabetes Father    . Heart Disease Father      "Heart Attack"   . Hearing Loss Father    . Cancer Sister      "Breast Cancer"   . Early Death Brother      "At [employer]   . Diabetes Sister    . Vision Loss Son      "Eye Problems"   . Other Son      "Alot of health problems due to motorcycle wreck"   . Arthritis Daughter    . Early Death Son      "Early 66's"       REVIEW OF SYSTEMS:    CONSTITUTIONAL:  negative  HEENT:  negative  CARDIOVASCULAR:  negative  GASTROINTESTINAL:  negative  GENITOURINARY:  negative  HEMATOLOGIC/LYMPHATIC:  negative  ENDOCRINE:  negative    PHYSICAL EXAM:    VITALS:  BP 139/57  Pulse 45  Temp(Src) 97.9 F (36.6 C) (Temporal)  Resp 16  Ht 4\' 9"  (1.448 m)  Wt 128 lb 2 oz (58.117 kg)  BMI 27.73 kg/m2  SpO2 98%  Breastfeeding? No  CONSTITUTIONAL:  awake, alert, no apparent distress and normal weight  ENT:  normocepalic, without obvious abnormality  NECK:  supple, symmetrical, trachea midline   LUNGS:  clear to auscultation  CARDIOVASCULAR:  regular rate and rhythm   BREASTS: No palpable mass in either breast. Has some areas of thickening throughout both breasts. No drainage from either nipple. Nipple-areolar complex normal.  ABDOMEN:  , normal bowel sounds, soft, non-distended, non-tender,   MUSCULOSKELETAL:  0+ pitting edema lower extremities  NEUROLOGIC:  Mental Status Exam:  Level of Alertness:   awake  Orientation:   person, place, time      DATA:  CBC:   Lab Results   Component Value Date    WBC 5.8 05/15/2011  RBC 4.01 05/15/2011    HGB 13.2 05/15/2011    HCT 38.2 05/15/2011    MCV 95.2 05/15/2011    MCH 32.9 05/15/2011    MCHC 34.5 05/15/2011    RDW 13.3 05/15/2011    PLT 172 05/15/2011    MPV 9.5 05/15/2011       ASSESSMENT AND PLAN:DCIS right breast. Will do lumpectomy with needle localization. Risks and benefits explained.    Lonia Mad BucklewMD

## 2011-05-20 NOTE — Op Note (Signed)
GENERAL SURGERY OPERATIVE REPORT  Mariella Saa, MD, FACS    Judy Velasquez  05/20/2011.       Indications: The patient was admitted to the hospital with a brief history of biopsy proven DCIS right breast. The patient now presents for lumpectomy with needle localization after discussing therapeutic alternatives.          Pre op [id number] right breast    Postop [id number]    Procedure:Lumpectomy right breast    Procedure Details:Pt was brought to the OR and placed supine on the OR table. After induction of general anesthesia, the right breast was prepped and draped in the usual fashion. An incision was made and taken down through the skin and subqu tissue. The area around the wire was excised and sent for mammographic confirmation.; A second area was removed to insure clear inferior margin. Bleeding was controlled with cautery and the skin was closed with 3-0 and 5-0 vicryl. Dermabond was applied.    Estimated Blood Loss:  Minimal           Drains: none           Total IV Fluids: 500 ml           Specimens: 2 right breast mass           Implants: none           Complications:  none           Disposition: PACU - hemodynamically stable.           Condition: stable    Postop the pt did well and is allowed to go home. She is to return to the office per her appointment. She can shower. She has no restrictions.    Lonia Mad Bucklew.

## 2011-05-20 NOTE — Progress Notes (Signed)
Cont to be nauseated  New order received  Pt medicated

## 2011-05-20 NOTE — Progress Notes (Signed)
Up to bathroom to void  States no nausea    Tolerated well   Up in chair  Discharge instructions given   Discharged home with daughter   Incision well approximated  No drng

## 2011-05-20 NOTE — Discharge Instructions (Signed)
Hydrocodone and Acetaminophen   En Espaol (Spanish Version)    Last modified: 12/05/2009    The following information is an educational aid only. It is not intended as medical advice for individual conditions or treatments. Talk to your doctor, nurse or pharmacist before following any medical regimen to see if it is safe and effective for you.    Pronunciation    (hye droe KOE done & a seet a MIN oh fen)    Pharmacologic Category    Analgesic Combination (Opioid)    U[place] Brand Names    hycet, Lorcet 10/650, Lorcet Plus, Lortab, Margesic H, Maxidone, Norco, Stagesic, Vicodin, Vicodin ES, Vicodin HP, Xodol 10/300, Xodol 5/300, Xodol 7.5/300, Zamicet, Zolvit, Zydone    Reasons not to take this medicine     If you have an allergy to hydrocodone, acetaminophen, or any other part of this medicine.       Tell healthcare provider if you are allergic to any medicine. Make sure to tell about the allergy and how it affected you. This includes telling about rash; hives; itching; shortness of breath; wheezing; cough; swelling of face, lips, tongue, or throat; or any other symptoms involved.       If you have any of the following conditions: Increased pressure in your brain, intestinal blockage, or severe lung disease.    What is this medicine used for?    This medicine is used to relieve pain.    How does it work?     Hydrocodone binds to brain receptors, relieving pain. It decreases the feeling of pain and a person's response to pain.       Acetaminophen blocks production and release of chemicals that cause pain.    How is it best taken?     Take this medicine with or without food. Take with food if it causes an upset stomach.       Drink plenty of noncaffeine-containing liquid unless told to drink less liquid by healthcare provider.       A liquid (elixir, solution) is available if you cannot swallow pills.       Those who have feeding tubes can also use the liquid. Flush the feeding tube before and after medicine  is given.       Do not take more than prescribed amount. Liver damage can occur.    What do I do if I miss a dose? (does not apply to patients in the hospital)     Take a missed dose as soon as possible.       If it is almost time for the next dose, skip the missed dose and return to your regular schedule.       Do not take a double dose or extra doses.       Many times this medicine is taken on an as needed basis.    What are the precautions when taking this medicine?     This medicine may be habit-forming with long-term use.       Avoid other sources of acetaminophen. An overdose may cause dangerous problems.       If you are 65 or older, use this medicine with caution. You could have more side effects.       If you have lung disease, talk with healthcare provider. You may be more sensitive to this medicine.       Check medicines with healthcare provider. This medicine may not mix well with other medicines.         Avoid medicines and natural products that slow your actions and reactions. These include sedatives, tranquilizers, mood stabilizers, antihistamines, and other pain medicine.       Avoid or limit alcohol intake (includes wine, beer, and liquor) to less than 3 drinks a day. Drinking too much alcohol may increase the risk of liver disease.       You may not be alert. Avoid driving, doing other tasks or activities until you see how this medicine affects you.       Be careful if you have G6PD deficiency. Anemia may occur.       Tell healthcare provider if you are pregnant or plan on getting pregnant.       Tell healthcare provider if you are breast-feeding.    What are some possible side effects of this medicine?     Feeling lightheaded, sleepy, having blurred vision, or a change in thinking clearly. Avoid driving, doing other tasks or activities that require you to be alert or have clear vision until you see how this medicine affects you.       Nausea or vomiting. Small frequent meals,  frequent mouth care, sucking hard, sugar-free candy, or chewing sugar-free gum may help.       Constipation. More liquids, regular exercise, or a fiber-containing diet may help. Talk with healthcare provider about a stool softener or laxative.       Liver damage can rarely occur.    What should I monitor?     Change in condition being treated. Is it better, worse, or about the same?       Keep a diary of pain control.       Bowel movements.    Reasons to call healthcare provider immediately     If you suspect an overdose, call your local poison control center or emergency department immediately.       Signs of a life-threatening reaction. These include wheezing; chest tightness; fever; itching; bad cough; blue skin color; fits; or swelling of face, lips, tongue, or throat.       Severe dizziness or passing out.       Difficulty breathing.       Significant change in thinking clearly and logically.       Poor pain control.       Severe nausea or vomiting.       Severe constipation.       Feeling extremely tired or weak.       Yellow skin or eyes.       Not able to eat.       Any rash.       No improvement in condition or feeling worse.    How should I store this medicine?     Store at room temperature.       Protect from light.       Protect capsules and tablets from moisture. Do not store in a bathroom or kitchen.    General statements     If you have a life-threatening allergy, wear allergy identification at all times.       Do not share your medicine with others and do not take anyone else's medicine.       Keep all medicine out of the reach of children and pets.       Most medicines can be thrown away in household trash after mixing with coffee grounds or kitty litter and sealing in a plastic bag.         In [place] return any unused drugs back to the pharmacy. Also, visit http://www.hc-sc.gc.ca/hl-vs/iyh-vsv/med/disposal-defaire-eng.php#th for more facts about the right way to get rid of  unused drugs.       Keep a list of all your medicines (prescription, natural products, supplements, vitamins, over-the-counter) with you. Give this list to healthcare provider (doctor, nurse, nurse practitioner, pharmacist, physician assistant).       Call your doctor for medical advice about side effects. You may report side effects to FDA at 1-800-FDA-1088 or in [place] to [facility] [place]'s [place] Vigilance Program at 1-866-234-2345.       Talk with healthcare provider before starting any new medicine, including over-the-counter, natural products, or vitamins.    Please be aware that this information is provided to supplement the care provided by your physician. It is neither intended nor implied to be a substitute for professional medical advice. CALL YOUR HEALTHCARE PROVIDER IMMEDIATELY IF YOU THINK YOU MAY HAVE A MEDICAL EMERGENCY. Always seek the advice of your physician or other qualified health provider prior to starting any new treatment or with any questions you may have regarding a medical condition.    Last modified: 12/05/2009

## 2011-05-20 NOTE — Progress Notes (Signed)
Medicated for c/o right breast pain.

## 2011-05-20 NOTE — Progress Notes (Signed)
Turned and repositioned in bed. Remedicated for c/o right breast discomfort. Reassurance given. Warm blankets on.

## 2011-05-20 NOTE — Progress Notes (Signed)
Report given at bedside to S. Hutzel RN.

## 2011-05-20 NOTE — Progress Notes (Signed)
Arrived no pain  Nauseated  Medicated after talking with Dr Domenick Bookbinder  Incision well approximated   No drng will observe

## 2011-05-20 NOTE — Anesthesia Pre-Procedure Evaluation (Signed)
Judy Velasquez     Anesthesia Evaluation     Patient summary reviewed and Nursing notes reviewed    History of anesthetic complications (PONV)   Airway   Mallampati: II  TM distance: >3 FB  Neck ROM: full  Dental - normal exam     Pulmonary    Cardiovascular   Exercise tolerance: good  (+) hypertension, past MI, CAD, DOE,   ROS comment: Stress test last month ok per pt   Beta Blocker:  Dose within 24 Hrs    Neuro/Psych    (+) psychiatric history  Comments: HOH  Anxiety    GI/Hepatic/Renal    (+) chronic renal disease,     Comments: IBS      Endo/Other    (+) type II diabetes well controlled, arthritis    Comments: Anemia  OA    Abdominal                   Allergies: Celebrex; Lisinopril; Neurontin; Norvasc; Other; Ciprofloxacin; and Lipitor    NPO Status: Time of last liquid consumption: 2300                       Time of last solid food consumption: 2100  Past Medical History   Diagnosis Date   . Osteoarthritis    . Hyperlipidemia    . Hypertension    . Anemia    . Type II or unspecified type diabetes mellitus without mention of complication, not stated as uncontrolled Dx 1980's   . Right Breast Cancer Dx 7-12   . Back problem      "Occ Back Hurts"   . CAD (coronary artery disease)      Sees Dr. Marcelino Freestone   . Allergic rhinitis    . Glaucoma      Bilateral Eyes   . Osteoporosis    . Heart attack 2006   . TB (tuberculosis)      "Probably had it as a child, my dad had it"   . Shortness of breath on exertion    . Blood transfusion    . Nausea & vomiting      Nausea/Vomiting Post Op In Past   . Anesthesia      Nausea/Vomiting Post Op In Past   . IBS (irritable bowel syndrome)    . Kidney problem      "Sees  Dr. Wallis Bamberg For Blood Pressure And Kidneys"   . Anxiety    . Staph infection 1980's     "They sent me to a tropical infection doctor"   . HOH (hard of hearing)      Bilateral Ears       Past Surgical History   Procedure Date   . Cesarean section "Late 1950's or Early 1960's"     X 3   . Carpal tunnel release 2000's      Right   . Breast surgery 7-12     Right Breast Biopsy   . Joint replacement 1980's     Total Right Hip   . Joint replacement 2000's     Total Right Hip   . Joint replacement 1990's     Total Left Hip   . Joint replacement 1980's     Total Right Knee   . Other surgical history      Family Physician Is Dr. Berneice Heinrich In [place], [place]   . Hysterectomy, total abdominal 1960's   . Cardiac surgery 2006  CABG (3 Bypasses)   . Other surgical history 2000's     "Took section of my intestines out, it was a exploratory operation, they found scar tissue from the c-sections"   . Colonoscopy 2000's   . Endoscopy, colon, diagnostic 1990's   . Tonsillectomy and adenoidectomy 1939   . Cholecystectomy, laparoscopic 1990's        History     Social History   . Marital Status: Widowed     Spouse Name: N/A     Number of Children: N/A   . Years of Education: N/A     Occupational History   . Not on file.     Social History Main Topics   . Smoking status: Never Smoker    . Smokeless tobacco: Never Used   . Alcohol Use: No   . Drug Use: No   . Sexually Active: No     Other Topics Concern   . Not on file     Social History Narrative    Blood Transfusion is acceptable in emergency        Present on Admission:   **None**     Allergies   Allergen Reactions   . Celebrex (Celecoxib) Shortness Of Breath and Swelling     "Swelling Of The Esophagus"   . Lisinopril Other (See Comments)     Dry Mouth   . Neurontin (Gabapentin) Other (See Comments)     Dizziness   . Norvasc (Amlodipine Besylate) Other (See Comments)     Edema Of Legs   . Other      "Allergic To Certain Seafoods Causing Headaches, Difficulty Breathing"   . Ciprofloxacin Rash   . Lipitor Other (See Comments)     Ache       Prescriptions prior to admission:Acetaminophen (TYLENOL PO), Take  by mouth as needed. Over The Counter   diltiazem (TIAZAC) 240 MG SR capsule, Take 240 mg by mouth every evening.    estradiol (ESTRACE) 0.5 MG tablet, Take 0.5 mg by mouth every morning.    Folic  Acid-Vit B6-Vit B12 (FOLBEE PO), Take  by mouth nightly.    Polysaccharide Iron Complex (FERREX 150 PO), Take  by mouth nightly.    alendronate (FOSAMAX) 70 MG tablet, Take 70 mg by mouth every 7 days. Sunday Morning   Naphazoline-Pheniramine (NAPHCON-A OP), Apply  to eye as needed. Over The Counter   Polyethylene Glycol 3350 GRAN, by Does not apply route.    latanoprost (XALATAN) 0.005 % ophthalmic solution, Place 1 drop into both eyes nightly.  glipiZIDE (GLUCOTROL) 10 MG tablet, Take 10 mg by mouth every morning.  fexofenadine (ALLEGRA) 180 MG tablet, Take 180 mg by mouth every morning.  pravastatin (PRAVACHOL) 10 MG tablet, Take 10 mg by mouth nightly.  furosemide (LASIX) 40 MG tablet, Take 40 mg by mouth 2 times daily.  carvedilol (COREG) 6.25 MG tablet, Take 6.25 mg by mouth 2 times daily.  hydrALAZINE (APRESOLINE) 10 MG tablet, Take 10 mg by mouth 2 times daily. Two Tablets Twice Daily    Hospital Medications:    lactated ringers infusion Continuous   lidocaine 1 % injection 1 mL Once PRN       Recent Vitals:  Filed Vitals:    05/20/11 0913   BP: 139/57   Pulse: 45   Temp: 97.9 F (36.6 C)   Resp: 16     Wt Readings from Last 3 Encounters:   05/20/11 128 lb 2 oz (58.117 kg)   05/15/11 128  lb 2 oz (58.117 kg)      Ht Readings from Last 3 Encounters:   05/20/11 4\' 9"  (1.448 m)   05/15/11 4\' 9"  (1.448 m)     Body mass index is 27.73 kg/(m^2).    LABS:    CBC  Lab Results   Component Value Date/Time    WBC 5.8 05/15/2011  3:20 PM    HGB 13.2 05/15/2011  3:20 PM    HCT 38.2 05/15/2011  3:20 PM    PLT 172 05/15/2011  3:20 PM     RENAL  Lab Results   Component Value Date/Time    NA 139 05/15/2011  3:20 PM    K 4.0 05/15/2011  3:20 PM    CL 102 05/15/2011  3:20 PM    CO2 31 05/15/2011  3:20 PM    BUN 30* 05/15/2011  3:20 PM    CREATININE 1.5* 05/15/2011  3:20 PM    GLUCOSE 95 05/15/2011  3:20 PM     COAGS  Lab Results   Component Value Date/Time    PROTIME 11.8 10/23/2009  6:40 PM    INR 1.08 10/23/2009  6:40 PM    APTT 29.4  10/23/2009  6:40 PM             Anesthesia Plan    ASA 3     general   (Discussed the possibility of sore throat, eye irritations, pain, nausea and vomiting, and shivering. Also mentioned the rare possibilities of heart rate and blood pressure as well as respiratory problems as part of many rare problems that could come up.   Also explained that we do our best to avoid any complication and to treat them if needed.    )  intravenous induction   Anesthetic plan and risks discussed with patient (Daughter in law ).    Plan discussed with CRNA.          Ree Alcalde  05/20/2011

## 2011-05-23 MED FILL — MIDAZOLAM HCL 2 MG/2ML IJ SOLN: 2 MG/ML | INTRAMUSCULAR | Qty: 2

## 2011-05-23 MED FILL — FENTANYL CITRATE 0.05 MG/ML IJ SOLN: 0.05 MG/ML | INTRAMUSCULAR | Qty: 2

## 2011-05-30 MED ORDER — ANASTROZOLE 1 MG PO TABS
1 MG | ORAL_TABLET | Freq: Every day | ORAL | Status: DC
Start: 2011-05-30 — End: 2014-04-21

## 2011-05-30 NOTE — Patient Instructions (Signed)
Thank you for enrolling in MyChart. Please follow the instructions below to securely access your online medical record. MyChart allows you to send messages to your doctor, view your test results, renew your prescriptions, schedule appointments, and more.     How Do I Sign Up?  1. In your Internet browser, go to [email or web address]  2. Click on the Sign Up Now link in the Sign In box. You will see the New Member Sign Up page.  3. Enter your MyChart Access Code exactly as it appears below. You will not need to use this code after you've completed the sign-up process. If you do not sign up before the expiration date, you must request a new code.  MyChart Access Code: [email or web address]  Expires: 07/29/2011  1:39 PM    4. Enter your Social Security Number ([id number]) and Date of Birth (mm/dd/yyyy) as indicated and click Submit. You will be taken to the next sign-up page.  5. Create a MyChart ID. This will be your MyChart login ID and cannot be changed, so think of one that is secure and easy to remember.  6. Create a MyChart password. You can change your password at any time.  7. Enter your Password Reset Question and Answer. This can be used at a later time if you forget your password.   8. Enter your e-mail address. You will receive e-mail notification when new information is available in MyChart.  9. Click Sign Up. You can now view your medical record.     Additional Information  If you have questions, please contact your physician practice where you receive care. Remember, MyChart is NOT to be used for urgent needs. For medical emergencies, dial 911.

## 2011-05-30 NOTE — Progress Notes (Signed)
Clarita is 3 weeks post-op: lumpectomy and sentinel node..  Presenting for routine follow-up.      S:  Doing well.  Patient has noted no excessive redness and swelling.      O:  Wound healing well. There is no drainage.    A:  Satisfactory course.      P: Pt is to stop estradiol.  Patient to return in 3 months. Will start on arimidex.    .    Patient is to return as needed for redness, swelling, discomfort, or any concern about her  surgery.

## 2011-07-10 LAB — BASIC METABOLIC PANEL
BUN: 36 mg/dL — ABNORMAL HIGH (ref 6–23)
CO2: 32 mMol/L (ref 20–32)
Calcium: 9.6 mg/dL (ref 8.3–10.6)
Chloride: 101 mMol/L (ref 99–109)
Creatinine: 1.6 mg/dL — ABNORMAL HIGH (ref 0.6–1.1)
GFR African American: 38 mL/min/{1.73_m2}
GFR Non-African American: 31 mL/min/{1.73_m2}
Glucose: 291 MG/DL — ABNORMAL HIGH (ref 70–140)
Potassium: 3.9 MMOL/L (ref 3.5–5.1)
Sodium: 139 mmol/L (ref 136–145)

## 2011-08-01 NOTE — Progress Notes (Signed)
Judy Velasquez     Judy Velasquez is here for cancer of the right  breast.  Nodes resected 0. Numbers positive: 0. Stage: TIS N0 M0. (Stage: 0)   Procedure: Lumpectomy Date: 9/12   Hormonal Therapy:yes  Chemotherapy: no  Radiation Therapy: yes    Current Evaluation:   Today the patient is basically feeling well. She denies new breast mass, nipple discharge, or breast pain. No new subcutaneous mass, headache, bone pain, new cough, or abdominal pain.     Physical Exam:   Generally healthy appearing, Caucasian female   Skin: no new subcutaneous mass   Breast: non-tender, no new mass   Lungs clear   Heart RRR  Lymphadenopathy: no new axillary and supraclavicular   Abdomen: non-tender without liver, spleen, kidney, or mass palpable     Mammography:   Last mammogram: 9/12   Personally reviewed by me. Mammogram is unchanged from previous mammograms. Radiologist report is suspicious     Impression:   No evidence of systemic or recurrent breast cancer:   No evidence of second primary breast cancer     Plan: RTC 42mo  Will need a mammogram in8 mo

## 2011-08-01 NOTE — Progress Notes (Signed)
Addended byParks Neptune on: 08/01/2011 01:15 PM     Modules accepted: Orders

## 2011-08-13 NOTE — Patient Instructions (Signed)
Continue current medications.  Walk daily for 30 minutes for exercise.  Follow up in 12 months with ECG sooner if needed.  Questions answered and she verbalizes understanding.

## 2011-08-13 NOTE — Progress Notes (Signed)
Judy Velasquez  06/19/1929    Dear Dr. Berneice Heinrich, MD    Than you for asking me to evaluate Judy Velasquez.    HPI:  Patient is a 77 y.o.year old female seeing me to establish follow up cardiac care as her cardiologist Dr. Carley Hammed is moving to [place].  Pt prefers to stay close to home. Pt had CABG x 3 in 2008 d/t heart attack. Pt states she has not had any cardiac concerns and hasn't since CABG. Pt has trouble exercising d/t arthritis. Pt is going to [facility] after Dec. 14th and wanted to establish before leaving.  Pt is diabetic, hx of htn, hlp,cad.      Allergies   Allergen Reactions   . Celebrex (Celecoxib) Shortness Of Breath and Swelling     "Swelling Of The Esophagus"   . Lisinopril Other (See Comments)     Dry Mouth   . Neurontin (Gabapentin) Other (See Comments)     Dizziness   . Norvasc (Amlodipine Besylate) Other (See Comments)     Edema Of Legs   . Other      "Allergic To Certain Seafoods Causing Headaches, Difficulty Breathing"   . Ciprofloxacin Rash   . Lipitor Other (See Comments)     Ache     Current Outpatient Prescriptions   Medication Sig Dispense Refill   . anastrozole (ARIMIDEX) 1 MG tablet Take 1 tablet by mouth daily.  30 tablet  11   . diltiazem (TIAZAC) 240 MG SR capsule Take 240 mg by mouth every evening.         . Folic Acid-Vit B6-Vit B12 (FOLBEE PO) Take  by mouth nightly.         . Polysaccharide Iron Complex (FERREX 150 PO) Take  by mouth nightly.         Marland Kitchen alendronate (FOSAMAX) 70 MG tablet Take 70 mg by mouth every 7 days. Sunday Morning        . Naphazoline-Pheniramine (NAPHCON-A OP) Apply  to eye as needed. Over The Counter        . Polyethylene Glycol 3350 GRAN by Does not apply route.         . latanoprost (XALATAN) 0.005 % ophthalmic solution Place 1 drop into both eyes nightly.       Marland Kitchen glipiZIDE (GLUCOTROL) 10 MG tablet Take 10 mg by mouth 2 times daily (before meals). Take two tablets twice a day       . fexofenadine (ALLEGRA) 180 MG tablet Take 180 mg by mouth  every morning.       . pravastatin (PRAVACHOL) 10 MG tablet Take 10 mg by mouth nightly.       . furosemide (LASIX) 40 MG tablet Take 40 mg by mouth 2 times daily.       . carvedilol (COREG) 6.25 MG tablet Take 6.25 mg by mouth 2 times daily.       . hydrALAZINE (APRESOLINE) 10 MG tablet Take 10 mg by mouth 2 times daily. Two Tablets Twice Daily         Past Medical History   Diagnosis Date   . Osteoarthritis    . Hyperlipidemia    . Hypertension    . Anemia    . Type II or unspecified type diabetes mellitus without mention of complication, not stated as uncontrolled Dx 1980's   . Right Breast Cancer Dx 7-12   . Back problem      "Occ Back Hurts"   . Allergic rhinitis    .  Glaucoma      Bilateral Eyes   . Osteoporosis    . Heart attack 2006   . TB (tuberculosis)      "Probably had it as a child, my dad had it"   . Shortness of breath on exertion    . Blood transfusion    . Nausea & vomiting      Nausea/Vomiting Post Op In Past   . Anesthesia      Nausea/Vomiting Post Op In Past   . IBS (irritable bowel syndrome)    . Kidney problem      "Sees  Dr. Wallis Bamberg For Blood Pressure And Kidneys"   . Anxiety    . Staph infection 1980's     "They sent me to a tropical infection doctor"   . HOH (hard of hearing)      Bilateral Ears   . DCIS (ductal carcinoma in situ) 05/20/2011   . CAD (coronary artery disease) 2008     Sees Dr. Marcelino Freestone CABG x 3   . H/O cardiovascular stress test 05/20/11   . Arthritis      s/p bilateral hip and right knee replacement sees Dr. Salomon Mast     Past Surgical History   Procedure Date   . Cesarean section "Late 1950's or Early 1960's"     X 3   . Carpal tunnel release 2000's     Right   . Breast surgery 7-12     Right Breast Biopsy   . Joint replacement 1980's     Total Right Hip   . Joint replacement 2000's     Total Right Hip   . Joint replacement 1990's     Total Left Hip   . Joint replacement 1980's     Total Right Knee   . Other surgical history      Family Physician Is Dr. Berneice Heinrich In [place],  [place]   . Hysterectomy, total abdominal 1960's   . Cardiac surgery 2008     CABG (3 Bypasses)   . Other surgical history 2000's     "Took section of my intestines out, it was a exploratory operation, they found scar tissue from the c-sections"   . Colonoscopy 2000's   . Endoscopy, colon, diagnostic 1990's   . Tonsillectomy and adenoidectomy 1939   . Cholecystectomy, laparoscopic 1990's   . Breast lumpectomy 05/20/2011     Right   . Coronary artery bypass graft 2008     cabg x 3     Family History   Problem Relation Age of Onset   . Heart Disease Mother    . Cancer Mother      "Cancer In Her Eye"   . Depression Mother    . Mental Illness Mother    . Diabetes Father    . Heart Disease Father      "Heart Attack"   . Hearing Loss Father    . Cancer Sister      "Breast Cancer"   . Early Death Brother      "At [employer]   . Diabetes Sister    . Vision Loss Son      "Eye Problems"   . Other Son      "Alot of health problems due to motorcycle wreck"   . Arthritis Daughter    . Early Death Son      "Early 55's"     History     Social History   . Marital Status: Widowed  Spouse Name: N/A     Number of Children: N/A   . Years of Education: N/A     Occupational History   . Not on file.     Social History Main Topics   . Smoking status: Never Smoker    . Smokeless tobacco: Never Used   . Alcohol Use: No   . Drug Use: No   . Sexually Active: No     Other Topics Concern   . Not on file     Social History Narrative    Do you donate blood or plasma? NoCaffeine intake? ModerateAdvance directive? NoIs blood transfusion acceptable in an emergency? YesLive alone or with others? With othersSunscreen used routinely? No able to care for self? Yes     Review of Systems: Pertinent items are noted in HPI.     Physical Exam:  BP 120/70  Pulse 64  Resp 16  Ht 5' (1.524 m)  Wt 123 lb (55.792 kg)  BMI 24.02 kg/m2  Constitutional: She is oriented to person, place, and time. She appears well-developed and well-nourished. No distress.   Head:  Normocephalic and atraumatic.   Chest:Sternum is well healed and stable  Neck: Neck supple. No JVD present. Carotid bruit is not present. No mass and no thyromegaly present.   Cardiovascular: Normal rate, regular rhythm, normal heart sounds and intact distal pulses.  Exam reveals no gallop and no friction rub.  No murmur heard.  Pulmonary: Effort normal and breath sounds normal. No respiratory distress. She has no wheezes, rhonchi or rales.   Abdominal: Soft, non-tender. Bowel sounds and aorta are normal. She exhibits no organomegaly, mass or bruit.   Extremities: No edema, cyanosis, or clubbing.  Neurological: She is alert and oriented to person, place, and time. She has normal reflexes. No cranial nerve deficit. Coordination normal.   Skin: Skin is warm and dry. There is no rash or diaphoresis.   Psychiatric: She has a normal mood and affect. Her speech is normal and behavior is normal. Judgment, cognition and memory are normal.     EKG : NSR 56 bpm, old ASWMI    Lab Review:   CBC:   Lab Results   Component Value Date    WBC 5.8 05/15/2011    HGB 13.2 05/15/2011    HCT 38.2 05/15/2011    PLT 172 05/15/2011   Lipids: followed by Dr. Ulyses Amor and said to be fine  Lab Results   Component Value Date    ALT 14 04/20/2010    AST 24 04/20/2010   Renal:   Lab Results   Component Value Date    BUN 36 07/10/2011    CREATININE 1.6 07/10/2011    NA 139 07/10/2011    K 3.9 07/10/2011   PT/INR:   Lab Results   Component Value Date    INR 1.08 10/23/2009     Assessment:    Patient Active Problem List   Diagnoses Code   . Type II or unspecified type diabetes mellitus without mention of complication, not stated as uncontrolled 250.00   . Shortness of breath on exertion 786.05   . Anemia 285.9   . Kidney problem 593.9   . CAD (coronary artery disease) 414.00   . Arthritis 716.90     Recommend  Continue current medications.  Walk daily for 30 minutes for exercise.  Follow up in 12 months with ECG sooner if needed.  Questions answered and  she verbalizes understanding.    Please call me if  you have any questions.        Deforest Hoyles, MD, 08/13/2011 2:19 PM

## 2011-12-23 ENCOUNTER — Inpatient Hospital Stay: Payer: Self-pay | Admitting: *Deleted

## 2011-12-23 LAB — CBC
HCT: 36.9 % (ref 35.0–47.0)
HGB: 12.6 g/dL (ref 12.0–16.0)
MCV: 94 fL (ref 80–100)
Platelet: 147 10*3/uL — ABNORMAL LOW (ref 150–440)
RBC: 3.92 10*6/uL (ref 3.80–5.20)
RDW: 12.3 % (ref 11.5–14.5)
WBC: 14 10*3/uL — ABNORMAL HIGH (ref 3.6–11.0)

## 2011-12-23 LAB — URINALYSIS, COMPLETE
Bacteria: NONE SEEN
Bilirubin,UR: NEGATIVE
Blood: NEGATIVE
Glucose,UR: NEGATIVE mg/dL (ref 0–75)
Hyaline Cast: 39
Nitrite: NEGATIVE
Ph: 5 (ref 4.5–8.0)
Protein: NEGATIVE
Specific Gravity: 1.018 (ref 1.003–1.030)
WBC UR: 6 /HPF (ref 0–5)

## 2011-12-23 LAB — COMPREHENSIVE METABOLIC PANEL
Alkaline Phosphatase: 74 U/L (ref 50–136)
Anion Gap: 12 (ref 7–16)
BUN: 44 mg/dL — ABNORMAL HIGH (ref 7–18)
Bilirubin,Total: 0.9 mg/dL (ref 0.2–1.0)
Co2: 28 mmol/L (ref 21–32)
Creatinine: 2.35 mg/dL — ABNORMAL HIGH (ref 0.60–1.30)
EGFR (African American): 26 — ABNORMAL LOW
EGFR (Non-African Amer.): 21 — ABNORMAL LOW
Glucose: 142 mg/dL — ABNORMAL HIGH (ref 65–99)
Potassium: 4.3 mmol/L (ref 3.5–5.1)
SGOT(AST): 29 U/L (ref 15–37)
SGPT (ALT): 21 U/L
Sodium: 136 mmol/L (ref 136–145)

## 2011-12-24 LAB — CBC WITH DIFFERENTIAL/PLATELET
Basophil %: 0 %
Eosinophil #: 0.1 10*3/uL (ref 0.0–0.7)
Eosinophil %: 1.4 %
HGB: 10.3 g/dL — ABNORMAL LOW (ref 12.0–16.0)
Lymphocyte #: 1.1 10*3/uL (ref 1.0–3.6)
MCH: 31.9 pg (ref 26.0–34.0)
Monocyte #: 0.6 10*3/uL (ref 0.0–0.7)
Neutrophil #: 6.1 10*3/uL (ref 1.4–6.5)
Neutrophil %: 76.2 %
Platelet: 127 10*3/uL — ABNORMAL LOW (ref 150–440)
RBC: 3.22 10*6/uL — ABNORMAL LOW (ref 3.80–5.20)
WBC: 8 10*3/uL (ref 3.6–11.0)

## 2011-12-24 LAB — BASIC METABOLIC PANEL
Calcium, Total: 8 mg/dL — ABNORMAL LOW (ref 8.5–10.1)
Chloride: 107 mmol/L (ref 98–107)
Creatinine: 1.77 mg/dL — ABNORMAL HIGH (ref 0.60–1.30)
EGFR (African American): 36 — ABNORMAL LOW
EGFR (Non-African Amer.): 29 — ABNORMAL LOW
Glucose: 96 mg/dL (ref 65–99)
Osmolality: 293 (ref 275–301)

## 2011-12-24 LAB — URINE CULTURE

## 2011-12-25 LAB — BASIC METABOLIC PANEL
Anion Gap: 10 (ref 7–16)
BUN: 24 mg/dL — ABNORMAL HIGH (ref 7–18)
Calcium, Total: 7.8 mg/dL — ABNORMAL LOW (ref 8.5–10.1)
Chloride: 111 mmol/L — ABNORMAL HIGH (ref 98–107)
Co2: 23 mmol/L (ref 21–32)
EGFR (African American): 52 — ABNORMAL LOW
EGFR (Non-African Amer.): 43 — ABNORMAL LOW
Osmolality: 290 (ref 275–301)
Potassium: 4 mmol/L (ref 3.5–5.1)
Sodium: 144 mmol/L (ref 136–145)

## 2011-12-25 LAB — CBC WITH DIFFERENTIAL/PLATELET
Basophil #: 0 10*3/uL (ref 0.0–0.1)
Basophil %: 0.2 %
Eosinophil %: 1.1 %
HCT: 28.9 % — ABNORMAL LOW (ref 35.0–47.0)
Lymphocyte #: 1 10*3/uL (ref 1.0–3.6)
MCH: 31.9 pg (ref 26.0–34.0)
MCV: 94 fL (ref 80–100)
Monocyte %: 8.9 %
Neutrophil #: 6.2 10*3/uL (ref 1.4–6.5)
Neutrophil %: 76.8 %
RBC: 3.06 10*6/uL — ABNORMAL LOW (ref 3.80–5.20)
RDW: 12.3 % (ref 11.5–14.5)
WBC: 8 10*3/uL (ref 3.6–11.0)

## 2011-12-29 LAB — CULTURE, BLOOD (SINGLE)

## 2012-03-13 LAB — BASIC METABOLIC PANEL
BUN: 41 mg/dL — ABNORMAL HIGH (ref 6–23)
CO2: 30 mMol/L (ref 20–32)
Calcium: 9.4 mg/dL (ref 8.3–10.6)
Chloride: 101 mMol/L (ref 99–109)
Creatinine: 1.6 mg/dL — ABNORMAL HIGH (ref 0.6–1.1)
GFR African American: 38 mL/min/{1.73_m2}
GFR Non-African American: 31 mL/min/{1.73_m2}
Glucose: 300 MG/DL — ABNORMAL HIGH (ref 70–140)
Potassium: 4.1 MMOL/L (ref 3.5–5.1)
Sodium: 141 mmol/L (ref 136–145)

## 2012-04-09 NOTE — Progress Notes (Signed)
Judy Velasquez   Judy Velasquez is here for cancer of the right breast. Nodes resected 0. Numbers positive: 0. Stage: TIS N0 M0. (Stage: 0)   Procedure: Lumpectomy Date: 9/12   Hormonal Therapy:yes   Chemotherapy: no   Radiation Therapy: yes      Current Evaluation:   Today the patient is basically feeling well. She denies new breast mass, nipple discharge, or breast pain. No new subcutaneous mass, headache, bone pain, new cough, or abdominal pain.     Physical Exam:   Generally healthy appearing, Caucasian female   Skin: no new subcutaneous mass   Breast: non-tender, no new mass   Lungs clear   Heart RRR  Lymphadenopathy: no new axillary and supraclavicular   Abdomen: non-tender without liver, spleen, kidney, or mass palpable     Mammography:   Last mammogram: 03/2012   Personally reviewed by me. Mammogram is unchanged from previous mammograms. Radiologist report is negative     Impression:   No evidence of systemic or recurrent breast cancer:   No evidence of second primary breast cancer     Plan: RTC 3 mo  Will need a mammogram in 1year

## 2012-04-24 NOTE — Procedures (Signed)
PATIENT NAME:                   PA #:             MR #Judy Velasquez, Judy Velasquez                   [id number]        [id number]         ATTENDING PHYSICIAN:                     VISIT DATE:  DIS DATE:       Berneice Heinrich, MD                       04/23/2012   04/23/2012      DATE OF BIRTH:     AGE:            PATIENT TYPE:      RM #:           21-Nov-1928         82              OPT                                         DATE OF STUDY:  04/23/2012        STUDIES REQUESTED:  Electromyogram.        REFERRING PHYSICIAN:  Dr. Ulyses Amor.        CLINICAL PROBLEM:  Pain and dysesthesia, left arm and shoulder.        IMPRESSION:        1.  Mild increase in insertional irritability is seen in the cervical  paraspinal muscles in C6-C7 region, left more than right.  This is consistent  with mild C6-C7 radiculopathy.        2.  No definite EMG changes are appreciated at this time in the left arm  muscles.  Nerve conduction velocities and distal latencies are normal.                                                        Sherryl Barters, MD              [id number]  DD: 04/24/2012 16:51   DT: 04/25/2012 09:37   Job #: [id number]  CC: Berneice Heinrich, MD  CC: Shineka Auble Victorio Palm, MD

## 2012-07-16 NOTE — Patient Instructions (Signed)
Please contact office with any questions or concerns

## 2012-07-16 NOTE — Progress Notes (Signed)
Judy Velasquez     Judy Velasquez is here for cancer of the right breast. Nodes resected 0. Numbers positive: 0. Stage: TIS N0 M0. (Stage: 0)   Procedure: Lumpectomy Date: 9/12 DCIS  Hormonal Therapy:yes   Chemotherapy: no   Radiation Therapy: yes           Current Evaluation:   Today the patient is basically feeling well. She denies new breast mass, nipple discharge, or breast pain. No new subcutaneous mass, headache, bone pain, new cough, or abdominal pain.     Physical Exam:   Generally healthy appearing, Caucasian female   Skin: no new subcutaneous mass   Breast: non-tender, no new mass   Lungs clear   Heart RRR  Lymphadenopathy: no new axillary and supraclavicular   Abdomen: non-tender without liver, spleen, kidney, or mass palpable     Mammography:   Last mammogram: 03/2013   Personally reviewed by me. Mammogram is unchanged from previous mammograms. Radiologist report is negative     Impression:   No evidence of systemic or recurrent breast cancer:   No evidence of second primary breast cancer     Plan: RTC 6 mo  Will need a mammogram in July, 2014

## 2012-11-18 ENCOUNTER — Ambulatory Visit: Payer: Self-pay | Admitting: Family Medicine

## 2012-11-23 ENCOUNTER — Ambulatory Visit: Payer: Self-pay | Admitting: Family Medicine

## 2012-12-03 ENCOUNTER — Other Ambulatory Visit: Payer: Self-pay

## 2012-12-03 ENCOUNTER — Encounter: Payer: Self-pay | Admitting: Gastroenterology

## 2012-12-03 ENCOUNTER — Telehealth: Payer: Self-pay

## 2012-12-03 DIAGNOSIS — K862 Cyst of pancreas: Secondary | ICD-10-CM

## 2012-12-03 NOTE — Telephone Encounter (Signed)
Pt has been scheduled for an EUS on 12/17/12 215 pm pt has been instructed and meds reviewed

## 2012-12-08 ENCOUNTER — Telehealth: Payer: Self-pay | Admitting: Gastroenterology

## 2012-12-08 NOTE — Telephone Encounter (Signed)
Pt called to say that she has to be put on antibiotics before dental procedures and wants to know if she needs to be on them for the EUS.  She had knee and hip replacement?

## 2012-12-09 ENCOUNTER — Encounter (HOSPITAL_COMMUNITY): Payer: Self-pay | Admitting: *Deleted

## 2012-12-09 NOTE — Progress Notes (Signed)
Called medical Dr. Jovita Kussmaul requested an EKG and any cardiac studies from [place] if available.

## 2012-12-09 NOTE — Telephone Encounter (Signed)
Pt is aware and will call with further questions or concerns

## 2012-12-09 NOTE — Telephone Encounter (Signed)
She does not need to be on antibiotics for the eus

## 2012-12-11 ENCOUNTER — Encounter (HOSPITAL_COMMUNITY): Payer: Self-pay | Admitting: Pharmacy Technician

## 2012-12-17 ENCOUNTER — Ambulatory Visit (HOSPITAL_COMMUNITY): Payer: Medicare HMO | Admitting: Anesthesiology

## 2012-12-17 ENCOUNTER — Encounter (HOSPITAL_COMMUNITY)
Admission: RE | Disposition: A | Discharge status: Home / Self Care | Payer: Self-pay | Source: Ambulatory Visit | Attending: Gastroenterology

## 2012-12-17 ENCOUNTER — Ambulatory Visit (HOSPITAL_COMMUNITY)
Admission: RE | Admit: 2012-12-17 | Discharge: 2012-12-17 | Disposition: A | Discharge status: Home / Self Care | Payer: Medicare HMO | Source: Ambulatory Visit | Attending: Gastroenterology | Admitting: Gastroenterology

## 2012-12-17 ENCOUNTER — Encounter (HOSPITAL_COMMUNITY): Payer: Self-pay | Admitting: *Deleted

## 2012-12-17 ENCOUNTER — Encounter (HOSPITAL_COMMUNITY): Payer: Self-pay | Admitting: Anesthesiology

## 2012-12-17 DIAGNOSIS — Z951 Presence of aortocoronary bypass graft: Secondary | ICD-10-CM | POA: Insufficient documentation

## 2012-12-17 DIAGNOSIS — K863 Pseudocyst of pancreas: Secondary | ICD-10-CM | POA: Insufficient documentation

## 2012-12-17 DIAGNOSIS — Z96649 Presence of unspecified artificial hip joint: Secondary | ICD-10-CM | POA: Insufficient documentation

## 2012-12-17 DIAGNOSIS — Z96659 Presence of unspecified artificial knee joint: Secondary | ICD-10-CM | POA: Insufficient documentation

## 2012-12-17 DIAGNOSIS — E119 Type 2 diabetes mellitus without complications: Secondary | ICD-10-CM | POA: Insufficient documentation

## 2012-12-17 DIAGNOSIS — Z800 Family history of malignant neoplasm of digestive organs: Secondary | ICD-10-CM | POA: Insufficient documentation

## 2012-12-17 DIAGNOSIS — H409 Unspecified glaucoma: Secondary | ICD-10-CM | POA: Insufficient documentation

## 2012-12-17 DIAGNOSIS — K862 Cyst of pancreas: Secondary | ICD-10-CM | POA: Insufficient documentation

## 2012-12-17 DIAGNOSIS — R933 Abnormal findings on diagnostic imaging of other parts of digestive tract: Secondary | ICD-10-CM

## 2012-12-17 DIAGNOSIS — E785 Hyperlipidemia, unspecified: Secondary | ICD-10-CM | POA: Insufficient documentation

## 2012-12-17 DIAGNOSIS — N189 Chronic kidney disease, unspecified: Secondary | ICD-10-CM | POA: Insufficient documentation

## 2012-12-17 DIAGNOSIS — I252 Old myocardial infarction: Secondary | ICD-10-CM | POA: Insufficient documentation

## 2012-12-17 DIAGNOSIS — I129 Hypertensive chronic kidney disease with stage 1 through stage 4 chronic kidney disease, or unspecified chronic kidney disease: Secondary | ICD-10-CM | POA: Insufficient documentation

## 2012-12-17 DIAGNOSIS — R634 Abnormal weight loss: Secondary | ICD-10-CM | POA: Insufficient documentation

## 2012-12-17 HISTORY — DX: Pneumonia, unspecified organism: J18.9

## 2012-12-17 HISTORY — DX: Heart failure, unspecified: I50.9

## 2012-12-17 HISTORY — DX: Unspecified osteoarthritis, unspecified site: M19.90

## 2012-12-17 HISTORY — DX: Chronic kidney disease, unspecified: N18.9

## 2012-12-17 HISTORY — DX: Headache: R51

## 2012-12-17 HISTORY — DX: Type 2 diabetes mellitus without complications: E11.9

## 2012-12-17 HISTORY — DX: Essential (primary) hypertension: I10

## 2012-12-17 HISTORY — DX: Hyperlipidemia, unspecified: E78.5

## 2012-12-17 HISTORY — DX: Malignant (primary) neoplasm, unspecified: C80.1

## 2012-12-17 HISTORY — PX: EUS: SHX5427

## 2012-12-17 HISTORY — DX: Unspecified glaucoma: H40.9

## 2012-12-17 HISTORY — DX: Shortness of breath: R06.02

## 2012-12-17 HISTORY — DX: Acute myocardial infarction, unspecified: I21.9

## 2012-12-17 SURGERY — UPPER ENDOSCOPIC ULTRASOUND (EUS) LINEAR
Anesthesia: Monitor Anesthesia Care

## 2012-12-17 MED ORDER — SODIUM CHLORIDE 0.9 % IV SOLN: INTRAVENOUS | Status: DC

## 2012-12-17 MED ORDER — CIPROFLOXACIN IN D5W 400 MG/200ML IV SOLN
INTRAVENOUS | Status: DC | PRN
  Administered 2012-12-17 (×2): 400 mg via INTRAVENOUS

## 2012-12-17 MED ORDER — PROPOFOL INFUSION 10 MG/ML OPTIME
INTRAVENOUS | Status: DC | PRN
  Administered 2012-12-17: 140 ug/kg/min via INTRAVENOUS

## 2012-12-17 MED ORDER — BUTAMBEN-TETRACAINE-BENZOCAINE 2-2-14 % EX AERO
INHALATION_SPRAY | CUTANEOUS | Status: DC | PRN
  Administered 2012-12-17: 2 via TOPICAL

## 2012-12-17 MED ORDER — CIPROFLOXACIN IN D5W 400 MG/200ML IV SOLN
400.0000 mg | Freq: Two times a day (BID) | INTRAVENOUS | Status: DC
  Administered 2012-12-17: 400 mg via INTRAVENOUS

## 2012-12-17 MED ORDER — CIPROFLOXACIN IN D5W 400 MG/200ML IV SOLN
INTRAVENOUS | Status: AC
  Filled 2012-12-17: qty 200

## 2012-12-17 MED ORDER — ONDANSETRON HCL 4 MG/2ML IJ SOLN
INTRAMUSCULAR | Status: DC | PRN
  Administered 2012-12-17: 4 mg via INTRAVENOUS

## 2012-12-17 MED ORDER — LIDOCAINE HCL (PF) 2 % IJ SOLN
INTRAMUSCULAR | Status: DC | PRN
  Administered 2012-12-17: 20 mg

## 2012-12-17 MED ORDER — LACTATED RINGERS IV SOLN
INTRAVENOUS | Status: DC | PRN
  Administered 2012-12-17: 13:00:00 via INTRAVENOUS

## 2012-12-17 MED ORDER — MIDAZOLAM HCL 5 MG/5ML IJ SOLN
INTRAMUSCULAR | Status: DC | PRN
  Administered 2012-12-17 (×4): 0.5 mg via INTRAVENOUS

## 2012-12-17 NOTE — Op Note (Signed)
[facility] [address] [place], [address]   ENDOSCOPIC ULTRASOUND PROCEDURE REPORT  PATIENT: Anna Simmons, Anna Simmons  MR#: [id number] BIRTHDATE: 19-Dec-1928  GENDER: Female ENDOSCOPIST: Rachael Fee, MD REFERRED BY:  Dr. Lutricia Feil at [facility] GI PROCEDURE DATE:  12/17/2012 PROCEDURE:   Upper EUS w/FNA ASA CLASS:      Class III INDICATIONS:   recent abdominal pain, 10 pound weight loss; outside CT scan shows several cystic lesions in pancreas; normal LFTs; sister had pancreatic cancer. MEDICATIONS: MAC sedation, administered by CRNA and Cipro 400 mg IV  DESCRIPTION OF PROCEDURE:   After the risks benefits and alternatives of the procedure were  explained, informed consent was obtained. The patient was then placed in the left, lateral, decubitus postion and IV sedation was administered. Throughout the procedure, the patients blood pressure, pulse and oxygen saturations were monitored continuously.  Under direct visualization, the Pentax Radial EUS [id number]  endoscope was introduced through the mouth  and advanced to the second portion of the duodenum .  Water was used as necessary to provide an acoustic interface.  Upon completion of the imaging, water was removed and the patient was sent to the recovery room in satisfactory condition.  Endoscopic findings: 1. Normal UGI tract  EUS findings: 1. There were multiple cysts in pancreas, from uncinate to tail. These ranged in size from 3-71mm to 3cm. Many appeared to communicate with the main pancreatic duct. There was no clear associated soft tissue masses or mural nodules.  The main pancreatic duct was not dilated.  I sampled the largest of the cysts (3cm cyst in tail) with a single pass with a 22 gauge BS EUS FNA needle. This removed 3cc of clear, thin fluid which was sent for CEA, amylase and cytology. 2. Pancreatic parenchyma, except as noted above, was normal. 3. CBD was normal, non-dilated 4. No  peripancreatic adenopathy 5. Limited views of liver, spleen, portal and splenic vessels were all normal  Impression: Multiple cysts throughout pancreas (from uncinate to tail). There were no associated concerning morphologic features however she has lost about 10 pounds and her sister died of pancreatic cancer. Fluid was aspirated from largest cyst and sent for cytology, CEA, amylase. She will complete a 3 day course of cipro twice daily.   _______________________________ eSignedRachael Fee, MD 12/17/2012 2:35 PM

## 2012-12-17 NOTE — Anesthesia Postprocedure Evaluation (Signed)
Anesthesia Post Note  Patient: Anna Simmons  Procedure(s) Performed: Procedure(s) (LRB): UPPER ENDOSCOPIC ULTRASOUND (EUS) LINEAR (N/A)  Anesthesia type: MAC  Patient location: PACU  Post pain: Pain level controlled  Post assessment: Post-op Vital signs reviewed  Last Vitals: BP 166/67  Temp(Src) 36.4 C (Oral)  Resp 14  Ht 5' (1.524 m)  Wt 115 lb (52.164 kg)  BMI 22.46 kg/m2  SpO2 100%  Post vital signs: Reviewed  Level of consciousness: awake  Complications: No apparent anesthesia complications

## 2012-12-17 NOTE — H&P (Signed)
  HPI: This is an 79 yo woman with cystic lesions in pancreas    Past Medical History  Diagnosis Date  . Hypertension   . Shortness of breath   . Pneumonia   . Diabetes mellitus without complication   . CHF (congestive heart failure)   . Myocardial infarction 1996  . Chronic kidney disease   . Headache     sinus  . Cancer 2012    Rt. Breast non invasive  . Arthritis     osteoarthritis  . Glaucoma   . Hyperlipidemia     Past Surgical History  Procedure Laterality Date  . Coronary artery bypass graft      triple   . Cesarean section    . C section    . Abdominal hysterectomy    . Joint replacement      hip bilateral  . Cesarean section    . Total hip arthroplasty    . Right total knee    . Cholecystectomy      Current Facility-Administered Medications  Medication Dose Route Frequency Provider Last Rate Last Dose  . 0.9 %  sodium chloride infusion   Intravenous Continuous Rachael Fee, MD        Allergies as of 12/03/2012  . (Not on File)    History reviewed. No pertinent family history.  History   Social History  . Marital Status: Widowed    Spouse Name: N/A    Number of Children: N/A  . Years of Education: N/A   Occupational History  . Not on file.   Social History Main Topics  . Smoking status: Never Smoker   . Smokeless tobacco: Never Used  . Alcohol Use: No  . Drug Use: No  . Sexually Active: Not on file   Other Topics Concern  . Not on file   Social History Narrative  . No narrative on file      Physical Exam: BP 159/57  Temp(Src) 97.5 F (36.4 C) (Oral)  Resp 18  Ht 5' (1.524 m)  Wt 115 lb (52.164 kg)  BMI 22.46 kg/m2  SpO2 100% Constitutional: generally well-appearing Psychiatric: alert and oriented x3 Abdomen: soft, nontender, nondistended, no obvious ascites, no peritoneal signs, normal bowel sounds     Assessment and plan: 78 y.o. female with cystic lesions in pancreas   For upper EUS today

## 2012-12-17 NOTE — Transfer of Care (Signed)
Immediate Anesthesia Transfer of Care Note  Patient: Anna Simmons  Procedure(s) Performed: Procedure(s): UPPER ENDOSCOPIC ULTRASOUND (EUS) LINEAR (N/A)  Patient Location: PACU  Anesthesia Type:MAC  Level of Consciousness: awake, alert , oriented and patient cooperative  Airway & Oxygen Therapy: Patient Spontanous Breathing and Patient connected to nasal cannula oxygen  Post-op Assessment: Report given to PACU RN and Post -op Vital signs reviewed and stable  Post vital signs: Reviewed and stable  Complications: No apparent anesthesia complications

## 2012-12-17 NOTE — Anesthesia Preprocedure Evaluation (Addendum)
Anesthesia Evaluation  Patient identified by MRN, date of birth, ID band Patient awake    Reviewed: Allergy & Precautions, H&P , NPO status , Patient's Chart, lab work & pertinent test results, reviewed documented beta blocker date and time   Airway Mallampati: II TM Distance: >3 FB Neck ROM: Full    Dental  (+) Dental Advisory Given, Missing, Chipped and Teeth Intact   Pulmonary shortness of breath and with exertion, pneumonia -, resolved,  breath sounds clear to auscultation        Cardiovascular hypertension, Pt. on home beta blockers and Pt. on medications + CAD, + Past MI, + CABG and +CHF + dysrhythmias Rhythm:Regular Rate:Normal     Neuro/Psych  Headaches, negative psych ROS   GI/Hepatic negative GI ROS, Neg liver ROS,   Endo/Other  negative endocrine ROSdiabetes, Type 2, Oral Hypoglycemic Agents  Renal/GU CRF and Renal InsufficiencyRenal disease     Musculoskeletal negative musculoskeletal ROS (+)   Abdominal   Peds  Hematology negative hematology ROS (+)   Anesthesia Other Findings   Reproductive/Obstetrics                          Anesthesia Physical Anesthesia Plan  ASA: III  Anesthesia Plan: MAC   Post-op Pain Management:    Induction: Intravenous  Airway Management Planned:   Additional Equipment:   Intra-op Plan:   Post-operative Plan:   Informed Consent: I have reviewed the patients History and Physical, chart, labs and discussed the procedure including the risks, benefits and alternatives for the proposed anesthesia with the patient or authorized representative who has indicated his/her understanding and acceptance.   Dental advisory given  Plan Discussed with: CRNA  Anesthesia Plan Comments:         Anesthesia Quick Evaluation

## 2012-12-18 ENCOUNTER — Encounter (HOSPITAL_COMMUNITY): Payer: Self-pay | Admitting: Gastroenterology

## 2012-12-18 ENCOUNTER — Telehealth: Payer: Self-pay | Admitting: Gastroenterology

## 2012-12-18 NOTE — Telephone Encounter (Signed)
Pt was advised that she does not need to be on antibiotics for her procedure

## 2012-12-24 ENCOUNTER — Telehealth: Payer: Self-pay | Admitting: Gastroenterology

## 2012-12-24 NOTE — Telephone Encounter (Signed)
Pt notified that results will be available next week and I will call her as soon as reviewed

## 2013-01-05 ENCOUNTER — Encounter: Payer: Self-pay | Admitting: Gastroenterology

## 2013-01-27 ENCOUNTER — Ambulatory Visit: Payer: Self-pay | Admitting: Gastroenterology

## 2013-03-29 LAB — COMPREHENSIVE METABOLIC PANEL
ALT: 15 U/L (ref 7–35)
AST: 19 U/L (ref 8–33)
Albumin: 4 G/DL (ref 3.4–4.8)
Alkaline Phosphatase: 56 U/L (ref 25–100)
BUN: 49 mg/dL — ABNORMAL HIGH (ref 6–23)
CO2: 30 mMol/L (ref 21–32)
Calcium: 9.5 mg/dL (ref 8.3–10.6)
Chloride: 102 mMol/L (ref 99–110)
Creatinine: 2 mg/dL — ABNORMAL HIGH (ref 0.6–1.1)
GFR African American: 29 mL/min/{1.73_m2}
GFR Non-African American: 24 mL/min/{1.73_m2}
Glucose, Fasting: 141 MG/DL — ABNORMAL HIGH (ref 70–99)
Potassium: 4.1 MMOL/L (ref 3.5–5.1)
Sodium: 142 mmol/L (ref 136–145)
Total Bilirubin: 0.3 mg/dL (ref 0.3–1.2)
Total Protein: 6.1 g/dL — ABNORMAL LOW (ref 6.4–8.3)

## 2013-03-30 IMAGING — US ABDOMEN ULTRASOUND LIMITED
1 series · 13 of 25 positions shown · non-contrast
Comparison: none

REASON FOR EXAM: RUQ Pain Nausea Vomiting
COMMENTS:

[Series 1: abdomen ultrasound limited · 0.30mm/px · 13 of 98 slices shown]
[im 1/98]
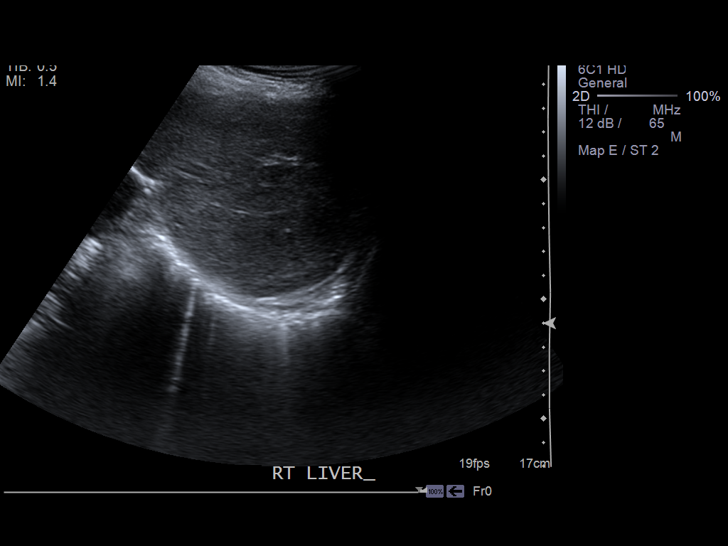
[im 9/98]
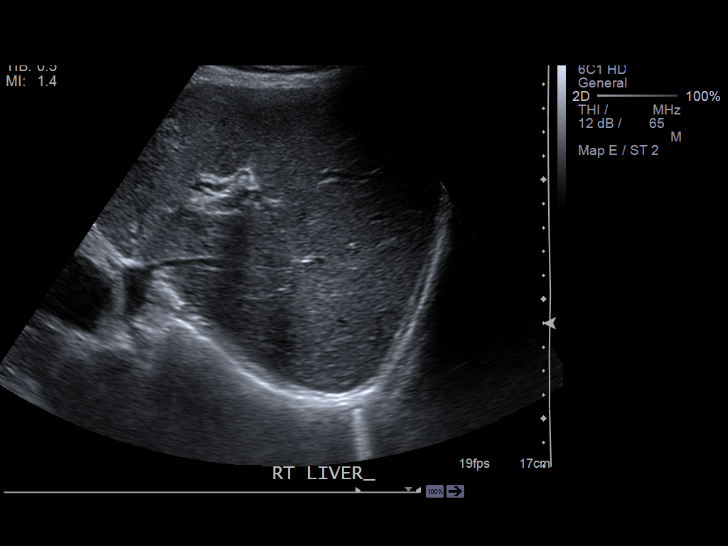
[im 17/98]
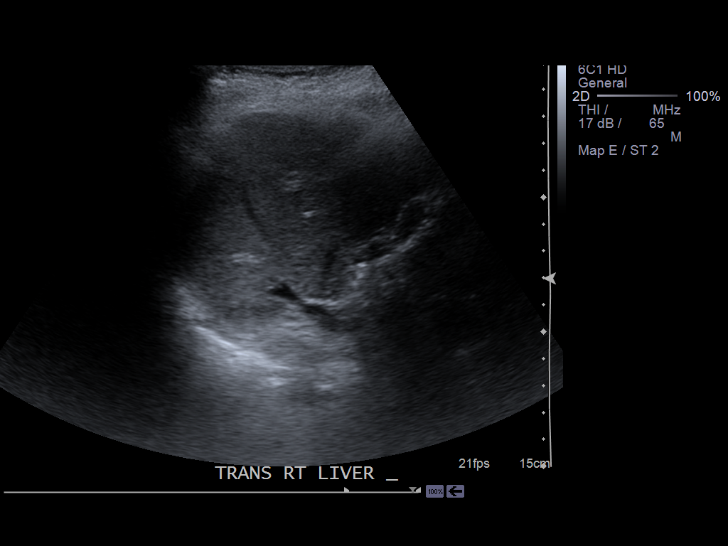
[im 25/98]
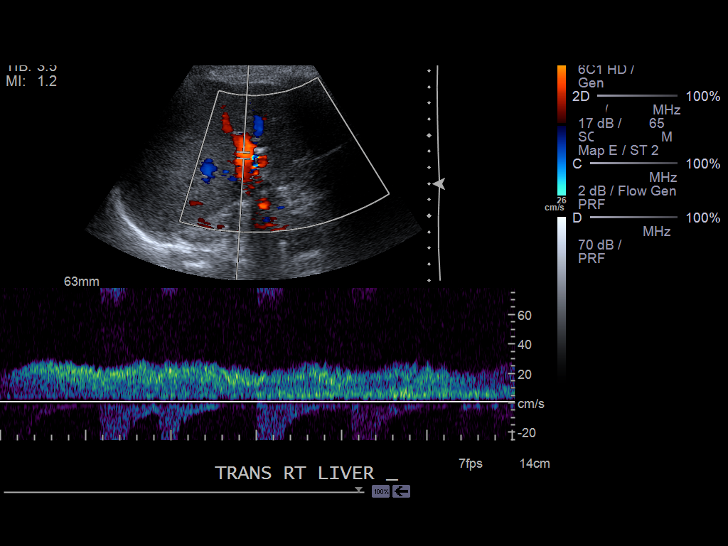
[im 33/98]
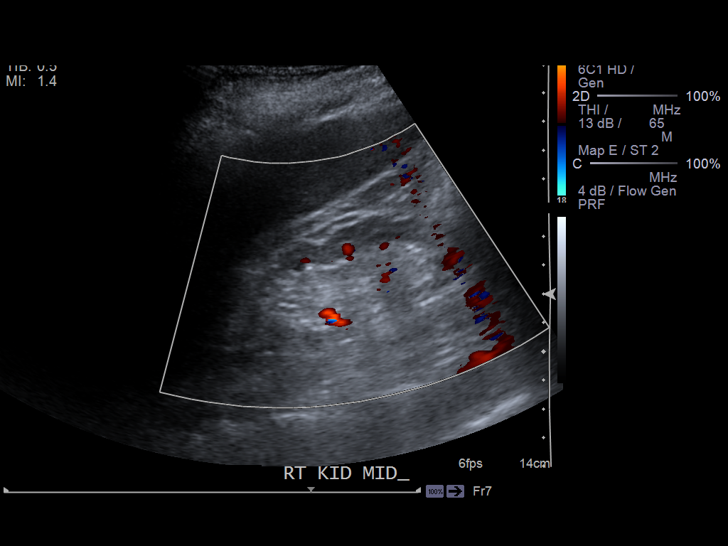
[im 41/98]
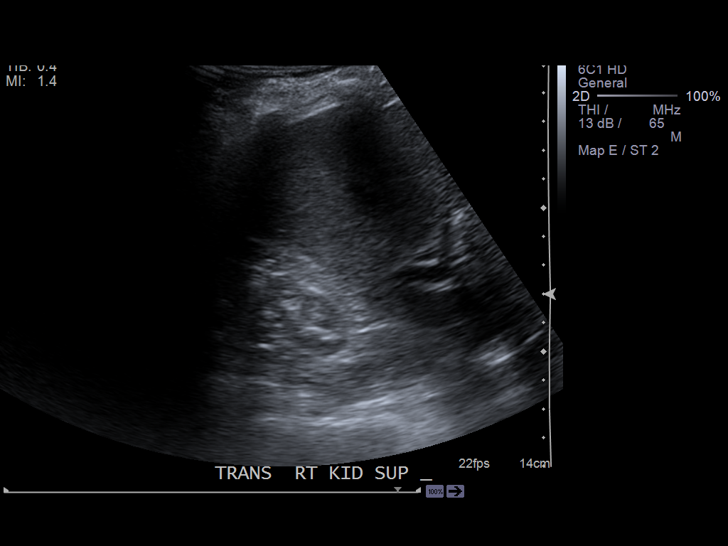
[im 49/98]
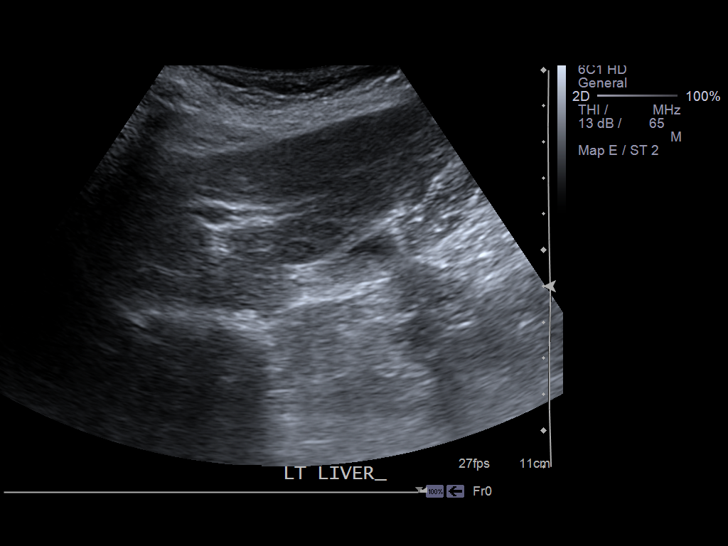
[im 57/98]
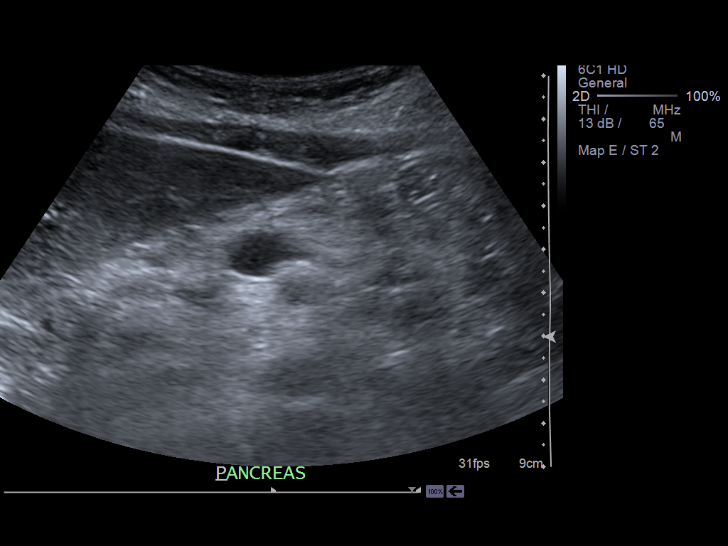
[im 65/98]
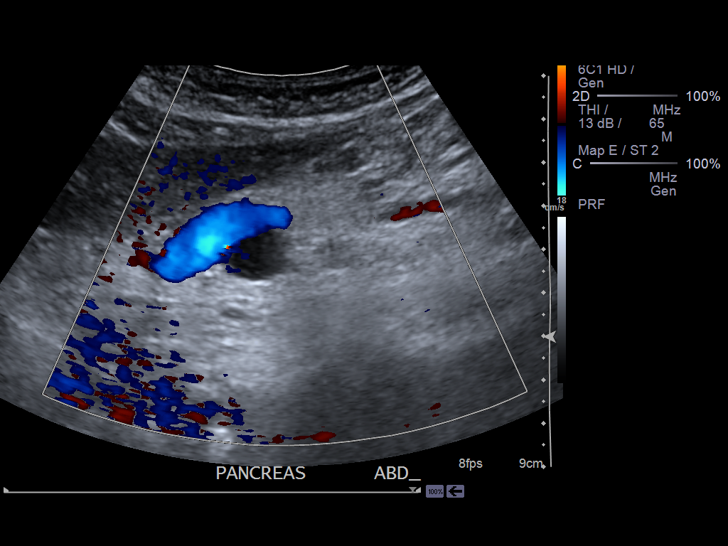
[im 73/98]
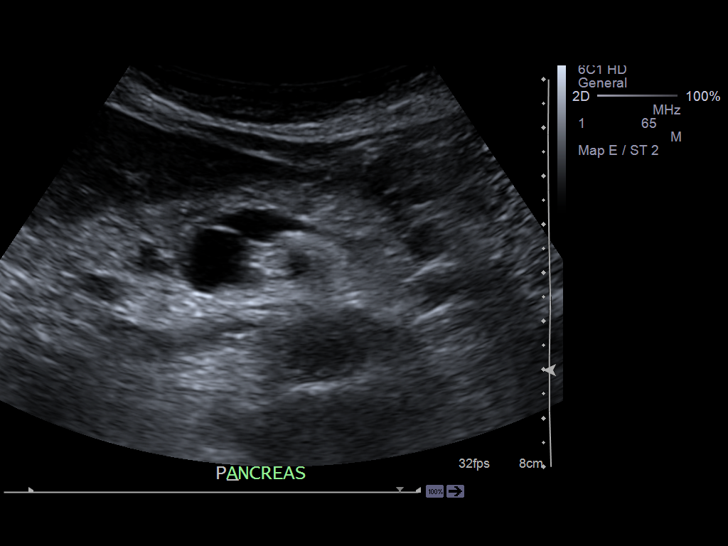
[im 81/98]
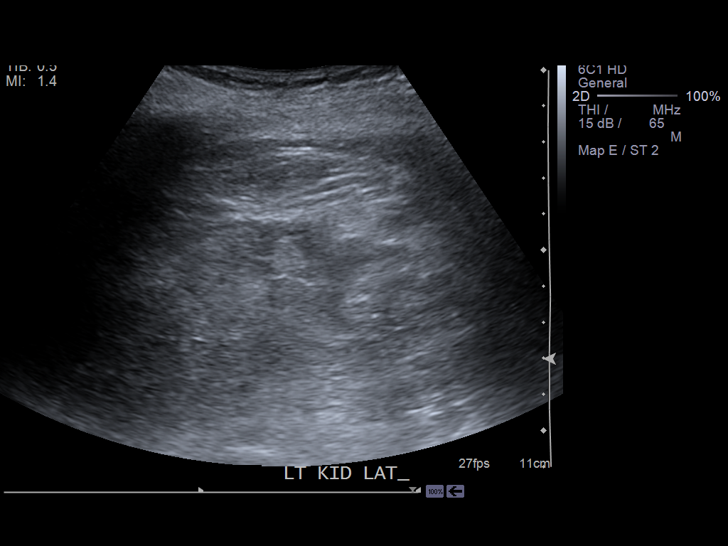
[im 89/98]
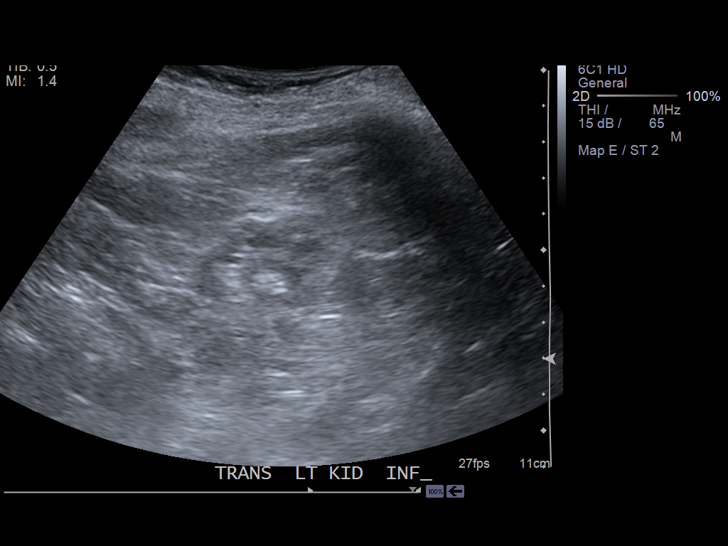
[im 98/98]
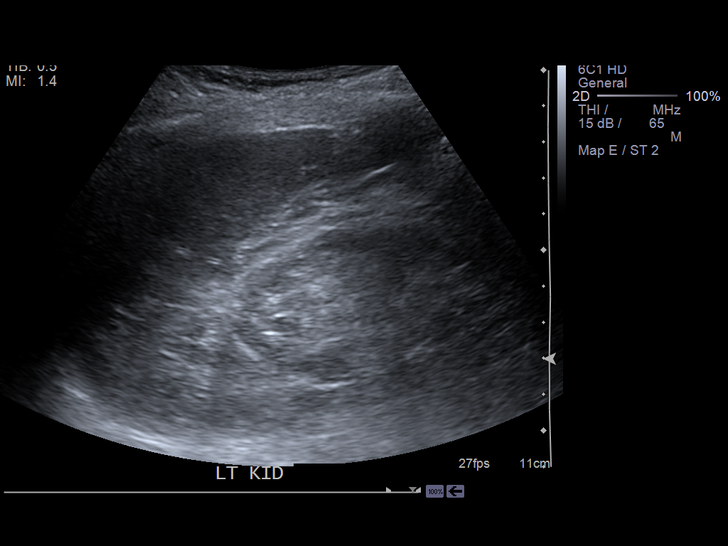

[13 of 25 positions shown; findings below may reference images not displayed]

PROCEDURE:     RTOYOTA - RTOYOTA ABDOMEN UPPER GENERAL  - November 18, 2012  [DATE]

RESULT:     History of cholecystectomy. And the echotexture is normal. The
liver length is 14.86 cm. The right kidney is or abnormally hyperechoic and
somewhat atrophic with a length of 8.25 cm in transverse dimensions of
x 4.69 cm. The cortical thickness is 7.0 mm. The common bile duct diameter
is 8.9 mm. There is no sonographic Murphy's sign. The area of the head of
the pancreas or uncinate process shows a cystic structure measuring 1.28 x
1.42 x 1.26 cm. The aorta is normal in caliber. The spleen is 9.22 cm in
length with a normal echotexture. There is cortical thinning of the left
kidney with an overall size of 9.00 x 3.97 x 3.90 cm.
IMPRESSION: 1. Significant atrophy in both kidneys. No obstruction.
2. Status post cholecystectomy.
3. Cystic mass in the area of the pancreatic head. This could represent a
benign cyst, pseudocyst or cystic neoplasm. The study is otherwise
unremarkable. Further evaluation with CT utilizing a pancreatic protocol
would be helpful. MRI followup could be considered if the patient can
suspend respiration appropriately and has normal renal function.

[REDACTED]

## 2013-04-22 NOTE — Progress Notes (Signed)
Judy Velasquez     Judy Velasquez is here for cancer of the right  Breast. DCIS  Procedure: lumpectomy Date: 03/2011   Hormonal Therapy:y  Chemotherapy: n  Radiation Therapy: y    Current Evaluation:   Today the patient is basically feeling well. She denies new breast mass, nipple discharge, or breast pain. No new subcutaneous mass, headache, bone pain, new cough, or abdominal pain.     Physical Exam:   Generally healthy appearing, Caucasian female   Skin: no new subcutaneous mass   Breast: non-tender, no new mass   Lungs clear   Heart RRR  Lymphadenopathy: no new axillary and supraclavicular   Abdomen: non-tender without liver, spleen, kidney, or mass palpable     Mammography:   Last mammogram: 03/2013   Personally reviewed by me. Mammogram is unchanged from previous mammograms. Radiologist report is negative     Impression:   No evidence of systemic or recurrent breast cancer:   No evidence of second primary breast cancer     Plan: RTC 1 year as she is moving to [place] for the winter.  Will need a mammogram in 1 year

## 2013-06-30 LAB — BASIC METABOLIC PANEL
BUN: 37 MG/DL — ABNORMAL HIGH (ref 6–23)
CO2: 24 MMOL/L (ref 21–32)
Calcium: 9.2 MG/DL (ref 8.3–10.6)
Chloride: 101 mMol/L (ref 99–110)
Creatinine: 1.9 MG/DL — ABNORMAL HIGH (ref 0.6–1.1)
GFR African American: 31 mL/min/{1.73_m2}
GFR Non-African American: 25 mL/min/{1.73_m2}
Glucose: 160 MG/DL — ABNORMAL HIGH (ref 70–140)
Potassium: 3.7 MMOL/L (ref 3.5–5.1)
Sodium: 139 MMOL/L (ref 136–145)

## 2013-09-09 ENCOUNTER — Ambulatory Visit: Payer: Self-pay | Admitting: Gastroenterology

## 2013-11-16 ENCOUNTER — Inpatient Hospital Stay: Payer: Self-pay | Admitting: Internal Medicine

## 2013-11-16 LAB — DRUG SCREEN, URINE
Amphetamines, Ur Screen: NEGATIVE (ref ?–1000)
BARBITURATES, UR SCREEN: NEGATIVE (ref ?–200)
Benzodiazepine, Ur Scrn: NEGATIVE (ref ?–200)
COCAINE METABOLITE, UR ~~LOC~~: NEGATIVE (ref ?–300)
Cannabinoid 50 Ng, Ur ~~LOC~~: NEGATIVE (ref ?–50)
MDMA (ECSTASY) UR SCREEN: NEGATIVE (ref ?–500)
Methadone, Ur Screen: NEGATIVE (ref ?–300)
Opiate, Ur Screen: NEGATIVE (ref ?–300)
PHENCYCLIDINE (PCP) UR S: NEGATIVE (ref ?–25)
Tricyclic, Ur Screen: NEGATIVE (ref ?–1000)

## 2013-11-16 LAB — URINALYSIS, COMPLETE
Bilirubin,UR: NEGATIVE
Blood: NEGATIVE
KETONE: NEGATIVE
LEUKOCYTE ESTERASE: NEGATIVE
Nitrite: NEGATIVE
PROTEIN: NEGATIVE
Ph: 5 (ref 4.5–8.0)
RBC,UR: <1 /HPF (ref 0–5)
Specific Gravity: 1.005 (ref 1.003–1.030)
WBC UR: 9 /HPF (ref 0–5)

## 2013-11-16 LAB — CBC WITH DIFFERENTIAL/PLATELET
BASOS ABS: 0 10*3/uL (ref 0.0–0.1)
BASOS PCT: 0.2 %
EOS PCT: 0.8 %
Eosinophil #: 0.1 10*3/uL (ref 0.0–0.7)
HCT: 30.6 % — ABNORMAL LOW (ref 35.0–47.0)
HGB: 10.4 g/dL — ABNORMAL LOW (ref 12.0–16.0)
LYMPHS ABS: 1.1 10*3/uL (ref 1.0–3.6)
LYMPHS PCT: 10.1 %
MCH: 31.5 pg (ref 26.0–34.0)
MCHC: 33.9 g/dL (ref 32.0–36.0)
MCV: 93 fL (ref 80–100)
Monocyte #: 1.5 x10 3/mm — ABNORMAL HIGH (ref 0.2–0.9)
Monocyte %: 14 %
NEUTROS PCT: 74.9 %
Neutrophil #: 8.3 10*3/uL — ABNORMAL HIGH (ref 1.4–6.5)
PLATELETS: 230 10*3/uL (ref 150–440)
RBC: 3.29 10*6/uL — ABNORMAL LOW (ref 3.80–5.20)
RDW: 13 % (ref 11.5–14.5)
WBC: 11 10*3/uL (ref 3.6–11.0)

## 2013-11-16 LAB — MAGNESIUM: MAGNESIUM: 2 mg/dL

## 2013-11-16 LAB — ETHANOL
Ethanol %: <0.003 % (ref 0.000–0.080)
Ethanol: <3 mg/dL

## 2013-11-16 LAB — COMPREHENSIVE METABOLIC PANEL
AST: 15 U/L (ref 15–37)
Albumin: 3.5 g/dL (ref 3.4–5.0)
Alkaline Phosphatase: 58 U/L
Anion Gap: 7 (ref 7–16)
BUN: 26 mg/dL — ABNORMAL HIGH (ref 7–18)
Bilirubin,Total: 0.7 mg/dL (ref 0.2–1.0)
CHLORIDE: 101 mmol/L (ref 98–107)
Calcium, Total: 8.9 mg/dL (ref 8.5–10.1)
Co2: 29 mmol/L (ref 21–32)
Creatinine: 1.99 mg/dL — ABNORMAL HIGH (ref 0.60–1.30)
EGFR (African American): 26 — ABNORMAL LOW
GFR CALC NON AF AMER: 23 — AB
Glucose: 35 mg/dL — CL (ref 65–99)
Osmolality: 275 (ref 275–301)
Potassium: 2.7 mmol/L — ABNORMAL LOW (ref 3.5–5.1)
SGPT (ALT): 9 U/L — ABNORMAL LOW (ref 12–78)
Sodium: 137 mmol/L (ref 136–145)
Total Protein: 6.9 g/dL (ref 6.4–8.2)

## 2013-11-16 LAB — PROTIME-INR
INR: 1
Prothrombin Time: 13.2 secs (ref 11.5–14.7)

## 2013-11-16 LAB — TROPONIN I

## 2013-11-16 LAB — CK TOTAL AND CKMB (NOT AT ARMC)
CK, TOTAL: 45 U/L
CK-MB: 0.5 ng/mL (ref 0.5–3.6)

## 2013-11-16 LAB — APTT: Activated PTT: 45.1 secs — ABNORMAL HIGH (ref 23.6–35.9)

## 2013-11-16 LAB — LIPASE, BLOOD: Lipase: 70 U/L — ABNORMAL LOW (ref 73–393)

## 2013-11-17 LAB — CBC WITH DIFFERENTIAL/PLATELET
Basophil #: 0 10*3/uL (ref 0.0–0.1)
Basophil %: 0.3 %
Eosinophil #: 0 10*3/uL (ref 0.0–0.7)
Eosinophil %: 0.4 %
HCT: 25.5 % — ABNORMAL LOW (ref 35.0–47.0)
HGB: 9 g/dL — AB (ref 12.0–16.0)
Lymphocyte #: 0.7 10*3/uL — ABNORMAL LOW (ref 1.0–3.6)
Lymphocyte %: 10.5 %
MCH: 32.6 pg (ref 26.0–34.0)
MCHC: 35.2 g/dL (ref 32.0–36.0)
MCV: 93 fL (ref 80–100)
Monocyte #: 0.9 x10 3/mm (ref 0.2–0.9)
Monocyte %: 12.4 %
NEUTROS ABS: 5.3 10*3/uL (ref 1.4–6.5)
NEUTROS PCT: 76.4 %
PLATELETS: 162 10*3/uL (ref 150–440)
RBC: 2.75 10*6/uL — ABNORMAL LOW (ref 3.80–5.20)
RDW: 13.2 % (ref 11.5–14.5)
WBC: 6.9 10*3/uL (ref 3.6–11.0)

## 2013-11-17 LAB — BASIC METABOLIC PANEL
Anion Gap: 5 — ABNORMAL LOW (ref 7–16)
BUN: 23 mg/dL — AB (ref 7–18)
CO2: 29 mmol/L (ref 21–32)
Calcium, Total: 8.2 mg/dL — ABNORMAL LOW (ref 8.5–10.1)
Chloride: 107 mmol/L (ref 98–107)
Creatinine: 1.72 mg/dL — ABNORMAL HIGH (ref 0.60–1.30)
EGFR (African American): 32 — ABNORMAL LOW
EGFR (Non-African Amer.): 27 — ABNORMAL LOW
Glucose: 89 mg/dL (ref 65–99)
Osmolality: 284 (ref 275–301)
POTASSIUM: 3.2 mmol/L — AB (ref 3.5–5.1)
SODIUM: 141 mmol/L (ref 136–145)

## 2013-11-17 LAB — CK-MB
CK-MB: 0.8 ng/mL (ref 0.5–3.6)
CK-MB: 1 ng/mL (ref 0.5–3.6)

## 2013-11-17 LAB — TSH: Thyroid Stimulating Horm: 0.591 u[IU]/mL

## 2013-11-17 LAB — TROPONIN I

## 2013-11-18 LAB — BASIC METABOLIC PANEL
ANION GAP: 6 — AB (ref 7–16)
BUN: 28 mg/dL — ABNORMAL HIGH (ref 7–18)
CHLORIDE: 106 mmol/L (ref 98–107)
CREATININE: 1.9 mg/dL — AB (ref 0.60–1.30)
Calcium, Total: 8.3 mg/dL — ABNORMAL LOW (ref 8.5–10.1)
Co2: 26 mmol/L (ref 21–32)
EGFR (African American): 28 — ABNORMAL LOW
EGFR (Non-African Amer.): 24 — ABNORMAL LOW
Glucose: 70 mg/dL (ref 65–99)
OSMOLALITY: 280 (ref 275–301)
POTASSIUM: 3.7 mmol/L (ref 3.5–5.1)
SODIUM: 138 mmol/L (ref 136–145)

## 2013-11-19 LAB — BASIC METABOLIC PANEL
Anion Gap: 7 (ref 7–16)
BUN: 28 mg/dL — ABNORMAL HIGH (ref 7–18)
CHLORIDE: 106 mmol/L (ref 98–107)
CO2: 26 mmol/L (ref 21–32)
Calcium, Total: 8.4 mg/dL — ABNORMAL LOW (ref 8.5–10.1)
Creatinine: 1.69 mg/dL — ABNORMAL HIGH (ref 0.60–1.30)
GFR CALC AF AMER: 32 — AB
GFR CALC NON AF AMER: 28 — AB
Glucose: 77 mg/dL (ref 65–99)
Osmolality: 282 (ref 275–301)
Potassium: 3.8 mmol/L (ref 3.5–5.1)
Sodium: 139 mmol/L (ref 136–145)

## 2013-11-22 ENCOUNTER — Telehealth: Payer: Self-pay

## 2013-11-22 NOTE — Telephone Encounter (Signed)
Message copied by Barron Alvine on Mon Nov 22, 2013  8:14 AM ------      Message from: Barron Alvine      Created: Fri Oct 08, 2013  1:45 PM       Pt needs EUS mac for panc cyst referred by Dr Candace Cruise, Dr Ardis Hughs reviewed and ok'd procedure see folder for notes ------

## 2013-11-23 NOTE — Telephone Encounter (Signed)
Pt procedure has been done

## 2013-12-13 ENCOUNTER — Telehealth: Payer: Self-pay

## 2013-12-13 ENCOUNTER — Other Ambulatory Visit: Payer: Self-pay

## 2013-12-13 DIAGNOSIS — K862 Cyst of pancreas: Secondary | ICD-10-CM

## 2013-12-13 NOTE — Telephone Encounter (Signed)
Pt scheduled for EUS 1 year f/u per Dr Candace Cruise EUS scheduled, pt instructed and medications reviewed.  Patient instructions mailed to home.  Patient to call with any questions or concerns.

## 2013-12-15 ENCOUNTER — Encounter (HOSPITAL_COMMUNITY): Payer: Self-pay | Admitting: *Deleted

## 2013-12-15 ENCOUNTER — Encounter (HOSPITAL_COMMUNITY): Payer: Self-pay | Admitting: Pharmacy Technician

## 2013-12-16 ENCOUNTER — Encounter: Payer: Self-pay | Admitting: Gastroenterology

## 2013-12-16 NOTE — Progress Notes (Signed)
11/18/2013-Echo from [facility] on chart. 10/26/2012-EKG from [facility] on chart.

## 2014-01-06 ENCOUNTER — Encounter (HOSPITAL_COMMUNITY): Payer: Medicare HMO | Admitting: Anesthesiology

## 2014-01-06 ENCOUNTER — Ambulatory Visit (HOSPITAL_COMMUNITY): Payer: Medicare HMO | Admitting: Anesthesiology

## 2014-01-06 ENCOUNTER — Encounter (HOSPITAL_COMMUNITY)
Admission: RE | Disposition: A | Discharge status: Home / Self Care | Payer: Self-pay | Source: Ambulatory Visit | Attending: Gastroenterology

## 2014-01-06 ENCOUNTER — Encounter (HOSPITAL_COMMUNITY): Payer: Self-pay

## 2014-01-06 ENCOUNTER — Ambulatory Visit (HOSPITAL_COMMUNITY)
Admission: RE | Admit: 2014-01-06 | Discharge: 2014-01-06 | Disposition: A | Discharge status: Home / Self Care | Payer: Medicare HMO | Source: Ambulatory Visit | Attending: Gastroenterology | Admitting: Gastroenterology

## 2014-01-06 DIAGNOSIS — I129 Hypertensive chronic kidney disease with stage 1 through stage 4 chronic kidney disease, or unspecified chronic kidney disease: Secondary | ICD-10-CM | POA: Insufficient documentation

## 2014-01-06 DIAGNOSIS — M1990 Unspecified osteoarthritis, unspecified site: Secondary | ICD-10-CM | POA: Insufficient documentation

## 2014-01-06 DIAGNOSIS — R970 Elevated carcinoembryonic antigen [CEA]: Secondary | ICD-10-CM | POA: Insufficient documentation

## 2014-01-06 DIAGNOSIS — E119 Type 2 diabetes mellitus without complications: Secondary | ICD-10-CM | POA: Insufficient documentation

## 2014-01-06 DIAGNOSIS — Z853 Personal history of malignant neoplasm of breast: Secondary | ICD-10-CM | POA: Insufficient documentation

## 2014-01-06 DIAGNOSIS — Z91013 Allergy to seafood: Secondary | ICD-10-CM | POA: Insufficient documentation

## 2014-01-06 DIAGNOSIS — E785 Hyperlipidemia, unspecified: Secondary | ICD-10-CM | POA: Insufficient documentation

## 2014-01-06 DIAGNOSIS — K863 Pseudocyst of pancreas: Principal | ICD-10-CM

## 2014-01-06 DIAGNOSIS — Z800 Family history of malignant neoplasm of digestive organs: Secondary | ICD-10-CM | POA: Insufficient documentation

## 2014-01-06 DIAGNOSIS — I252 Old myocardial infarction: Secondary | ICD-10-CM | POA: Insufficient documentation

## 2014-01-06 DIAGNOSIS — K862 Cyst of pancreas: Secondary | ICD-10-CM | POA: Insufficient documentation

## 2014-01-06 DIAGNOSIS — Z889 Allergy status to unspecified drugs, medicaments and biological substances status: Secondary | ICD-10-CM | POA: Insufficient documentation

## 2014-01-06 DIAGNOSIS — I509 Heart failure, unspecified: Secondary | ICD-10-CM | POA: Insufficient documentation

## 2014-01-06 DIAGNOSIS — N189 Chronic kidney disease, unspecified: Secondary | ICD-10-CM | POA: Insufficient documentation

## 2014-01-06 HISTORY — PX: EUS: SHX5427

## 2014-01-06 LAB — GLUCOSE, CAPILLARY: GLUCOSE-CAPILLARY: 162 mg/dL — AB (ref 70–99)

## 2014-01-06 LAB — PANC CYST FLD ANLYS-PATHFNDR-TG

## 2014-01-06 SURGERY — UPPER ENDOSCOPIC ULTRASOUND (EUS) LINEAR
Anesthesia: Monitor Anesthesia Care

## 2014-01-06 MED ORDER — SODIUM CHLORIDE 0.9 % IV SOLN: INTRAVENOUS | Status: DC

## 2014-01-06 MED ORDER — PROPOFOL INFUSION 10 MG/ML OPTIME
INTRAVENOUS | Status: DC | PRN
  Administered 2014-01-06: 70 ug/kg/min via INTRAVENOUS

## 2014-01-06 MED ORDER — PROPOFOL 10 MG/ML IV BOLUS
INTRAVENOUS | Status: AC
  Filled 2014-01-06: qty 20

## 2014-01-06 MED ORDER — CIPROFLOXACIN IN D5W 400 MG/200ML IV SOLN
INTRAVENOUS | Status: DC | PRN
  Administered 2014-01-06: 400 mg via INTRAVENOUS

## 2014-01-06 MED ORDER — MIDAZOLAM HCL 2 MG/2ML IJ SOLN
INTRAMUSCULAR | Status: AC
  Filled 2014-01-06: qty 2

## 2014-01-06 MED ORDER — MIDAZOLAM HCL 5 MG/5ML IJ SOLN
INTRAMUSCULAR | Status: DC | PRN
  Administered 2014-01-06: 2 mg via INTRAVENOUS

## 2014-01-06 MED ORDER — LACTATED RINGERS IV SOLN
INTRAVENOUS | Status: DC | PRN
  Administered 2014-01-06: 09:00:00 via INTRAVENOUS

## 2014-01-06 MED ORDER — KETAMINE HCL 10 MG/ML IJ SOLN
INTRAMUSCULAR | Status: DC | PRN
  Administered 2014-01-06: 20 mg via INTRAVENOUS

## 2014-01-06 MED ORDER — CIPROFLOXACIN HCL 500 MG PO TABS: 500.0000 mg | ORAL_TABLET | Freq: Two times a day (BID) | ORAL | Status: DC

## 2014-01-06 MED ORDER — CIPROFLOXACIN IN D5W 400 MG/200ML IV SOLN
INTRAVENOUS | Status: AC
  Filled 2014-01-06: qty 200

## 2014-01-06 NOTE — Anesthesia Postprocedure Evaluation (Signed)
  Anesthesia Post-op Note  Patient: Anna Simmons  Procedure(s) Performed: Procedure(s) (LRB): UPPER ENDOSCOPIC ULTRASOUND (EUS) LINEAR (N/A)  Patient Location: PACU  Anesthesia Type: MAC  Level of Consciousness: awake and alert   Airway and Oxygen Therapy: Patient Spontanous Breathing  Post-op Pain: mild  Post-op Assessment: Post-op Vital signs reviewed, Patient's Cardiovascular Status Stable, Respiratory Function Stable, Patent Airway and No signs of Nausea or vomiting  Last Vitals:  Filed Vitals:   01/06/14 0912  BP: 134/54  Pulse: 60  Temp: 36.8 C  Resp: 16    Post-op Vital Signs: stable   Complications: No apparent anesthesia complications

## 2014-01-06 NOTE — Discharge Instructions (Signed)

## 2014-01-06 NOTE — Anesthesia Preprocedure Evaluation (Signed)
Anesthesia Evaluation  Patient identified by MRN, date of birth, ID band Patient awake    Reviewed: Allergy & Precautions, H&P , NPO status , Patient's Chart, lab work & pertinent test results, reviewed documented beta blocker date and time   Airway Mallampati: II TM Distance: >3 FB Neck ROM: Full    Dental  (+) Dental Advisory Given, Missing, Chipped, Teeth Intact   Pulmonary shortness of breath and with exertion, pneumonia -, resolved,  breath sounds clear to auscultation        Cardiovascular hypertension, Pt. on home beta blockers and Pt. on medications + CAD, + Past MI, + CABG and +CHF + dysrhythmias Rhythm:Regular Rate:Normal     Neuro/Psych  Headaches, negative psych ROS   GI/Hepatic negative GI ROS, Neg liver ROS,   Endo/Other  negative endocrine ROSdiabetes, Type 2, Oral Hypoglycemic Agents  Renal/GU CRF and Renal InsufficiencyRenal disease     Musculoskeletal negative musculoskeletal ROS (+)   Abdominal   Peds  Hematology negative hematology ROS (+)   Anesthesia Other Findings   Reproductive/Obstetrics                           Anesthesia Physical  Anesthesia Plan  ASA: III  Anesthesia Plan: MAC   Post-op Pain Management:    Induction: Intravenous  Airway Management Planned:   Additional Equipment:   Intra-op Plan:   Post-operative Plan:   Informed Consent: I have reviewed the patients History and Physical, chart, labs and discussed the procedure including the risks, benefits and alternatives for the proposed anesthesia with the patient or authorized representative who has indicated his/her understanding and acceptance.   Dental advisory given  Plan Discussed with: CRNA  Anesthesia Plan Comments:         Anesthesia Quick Evaluation

## 2014-01-06 NOTE — Op Note (Signed)
[facility] [place], [id number]   ENDOSCOPIC ULTRASOUND PROCEDURE REPORT  PATIENT: Anna, Simmons  MR#: [id number] BIRTHDATE: [birth year]  GENDER: Female ENDOSCOPIST: Milus Banister, MD REFERRED BY:  Verdie Shire, MD PROCEDURE DATE:  01/06/2014 PROCEDURE:   Upper EUS w/FNA ASA CLASS:      Class III INDICATIONS:   1.  multiple pancreatic cysts; previous EUS 1 year ago CEA 3,770 ng/mL; amylase 27 u/L; cytology negative; I recommended considering surgical opinion for possible pre-cancerous process; repeat CT scan 5 months ago showed no change morpholigically; continued weight loss and FH of pancreatic cancer.  MEDICATIONS: MAC sedation, administered by CRNA and Cipro 400 mg IV   DESCRIPTION OF PROCEDURE:   After the risks benefits and alternatives of the procedure were  explained, informed consent was obtained. The patient was then placed in the left, lateral, decubitus postion and IV sedation was administered. Throughout the procedure, the patients blood pressure, pulse and oxygen saturations were monitored continuously.  Under direct visualization, the EUS scope  endoscope was introduced through the mouth  and advanced to the second portion of the duodenum .  Water was used as necessary to provide an acoustic interface.  Upon completion of the imaging, water was removed and the patient was sent to the recovery room in satisfactory condition.  Endoscopic findings (limited views with radial and linear echoendoscopes): 1. Normal UGI tract  EUS findings: 1. There were 15-20 cysts throughout her pancreas (uncinate to tail). These ranged in size from 42mm to 3cm.  Some appeared continuous with main pancreatic duct that was slightly dilated. There were no clear associated solid masses, nodules. The largest cyst, in tail of pancreas measuring 2.8cm was completely aspirated using a single transgastric pass with a 19 guage EUS FNA needle. 9cc of clear, thick  fluid was aspirated and sent for testing. 2. Pancreatic parenchyma was otherwise normal. 3. CBD was normal, non-dilated and contained no stones. 4. No peripancreatic adenopathy. 5. Limited views of liver, spleen, portal and splenic vessels were all normal.  Impression: Multiple cysts throughout entire pancreas, morphologically unchanged from EUS 1 year ago.  FNA performed from largest cyst and fluid sent to CEA, amylase, cytology.  Await final results. She will complete 3 days of twice daily cipro for now.    _______________________________ eSigned:  Milus Banister, MD 01/06/2014 9:23 AM   PATIENT NAME:  Anna, Simmons MR#: [id number]

## 2014-01-06 NOTE — H&P (Signed)
  HPI: This is an 80 yo woman with FH of pancreatic cancer, known multiple cysts in her pancreas. EUS last year with elevated CEA, low amylase. I recommended to her primary gastroenterology that surgical consultation be considered.  Given her age, diffuse nature of her cysts she would have required a total pancreatectomy. 08/2013 CT scan showed no change in cyst size or morphology.      Past Medical History  Diagnosis Date  . Hypertension   . Shortness of breath   . Pneumonia   . Diabetes mellitus without complication   . CHF (congestive heart failure)   . Myocardial infarction 1996  . Chronic kidney disease   . Headache(784.0)     sinus  . Cancer 2012    Rt. Breast non invasive  . Arthritis     osteoarthritis  . Glaucoma   . Hyperlipidemia     Past Surgical History  Procedure Laterality Date  . Coronary artery bypass graft      triple   . Cesarean section    . C section    . Abdominal hysterectomy    . Joint replacement      hip bilateral  . Cesarean section    . Total hip arthroplasty    . Right total knee    . Cholecystectomy    . Eus N/A 12/17/2012    Procedure: UPPER ENDOSCOPIC ULTRASOUND (EUS) LINEAR;  Surgeon: Milus Banister, MD;  Location: [facility];  Service: Endoscopy;  Laterality: N/A;    No current facility-administered medications for this encounter.    Allergies as of 12/13/2013 - Review Complete 12/17/2012  Allergen Reaction Noted  . Celebrex [celecoxib] Anaphylaxis 12/11/2012  . Fish allergy Anaphylaxis 12/11/2012  . Shellfish allergy Anaphylaxis 12/11/2012    History reviewed. No pertinent family history.  History   Social History  . Marital Status: Widowed    Spouse Name: N/A    Number of Children: N/A  . Years of Education: N/A   Occupational History  . Not on file.   Social History Main Topics  . Smoking status: Never Smoker   . Smokeless tobacco: Never Used  . Alcohol Use: No  . Drug Use: No  . Sexual Activity: Not on file    Other Topics Concern  . Not on file   Social History Narrative  . No narrative on file      Physical Exam: BP 155/58  Pulse 58  Temp(Src) 98.2 F (36.8 C)  Resp 18  Ht 5' (1.524 m)  Wt 115 lb (52.164 kg)  BMI 22.46 kg/m2  SpO2 99% Constitutional: generally well-appearing Psychiatric: alert and oriented x3 Abdomen: soft, nontender, nondistended, no obvious ascites, no peritoneal signs, normal bowel sounds     Assessment and plan: 80 y.o. female with mulitple pancreatic cysts  For repeat EUS today.

## 2014-01-06 NOTE — Transfer of Care (Signed)
Immediate Anesthesia Transfer of Care Note  Patient: Anna Simmons  Procedure(s) Performed: Procedure(s): UPPER ENDOSCOPIC ULTRASOUND (EUS) LINEAR (N/A)  Patient Location: PACU  Anesthesia Type:MAC  Level of Consciousness: sedated  Airway & Oxygen Therapy: Patient Spontanous Breathing and Patient connected to nasal cannula oxygen  Post-op Assessment: Report given to PACU RN and Post -op Vital signs reviewed and stable  Post vital signs: Reviewed and stable  Complications: No apparent anesthesia complications

## 2014-01-07 ENCOUNTER — Encounter (HOSPITAL_COMMUNITY): Payer: Self-pay | Admitting: Gastroenterology

## 2014-01-26 ENCOUNTER — Encounter: Payer: Self-pay | Admitting: Gastroenterology

## 2014-02-11 DIAGNOSIS — D631 Anemia in chronic kidney disease: Secondary | ICD-10-CM | POA: Insufficient documentation

## 2014-02-11 DIAGNOSIS — I10 Essential (primary) hypertension: Secondary | ICD-10-CM | POA: Insufficient documentation

## 2014-02-11 DIAGNOSIS — N184 Chronic kidney disease, stage 4 (severe): Secondary | ICD-10-CM | POA: Insufficient documentation

## 2014-02-11 DIAGNOSIS — E7800 Pure hypercholesterolemia, unspecified: Secondary | ICD-10-CM | POA: Insufficient documentation

## 2014-02-11 DIAGNOSIS — M109 Gout, unspecified: Secondary | ICD-10-CM | POA: Insufficient documentation

## 2014-02-11 DIAGNOSIS — N189 Chronic kidney disease, unspecified: Secondary | ICD-10-CM

## 2014-03-21 LAB — BASIC METABOLIC PANEL
Anion Gap: 9 (ref 4–16)
BUN: 34 MG/DL — ABNORMAL HIGH (ref 6–23)
CO2: 28 MMOL/L (ref 21–32)
Calcium: 9.3 MG/DL (ref 8.3–10.6)
Chloride: 101 mMol/L (ref 99–110)
Creatinine: 2 MG/DL — ABNORMAL HIGH (ref 0.6–1.1)
GFR African American: 29 mL/min/{1.73_m2}
GFR Non-African American: 24 mL/min/{1.73_m2}
Glucose: 137 MG/DL (ref 70–140)
Potassium: 4.3 MMOL/L (ref 3.5–5.1)
Sodium: 138 MMOL/L (ref 136–145)

## 2014-03-28 IMAGING — CT CT HEAD WITHOUT CONTRAST
1 series · 16 of 30 positions shown, 20 images · non-contrast
Comparison: None.

CLINICAL DATA: Fall.  CVA.

EXAM:
CT HEAD WITHOUT CONTRAST
TECHNIQUE: Contiguous axial images were obtained from the base of the skull
through the vertex without intravenous contrast.

[Series 2: head wo · axial · 0.41mm/px · z∈[-182,-56]mm · 16 of 32 slices shown, 20 images]
[im 2/32  brain]
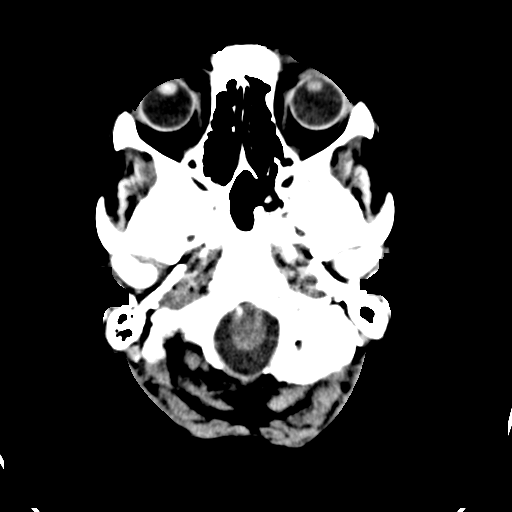
[im 2/32  bone]
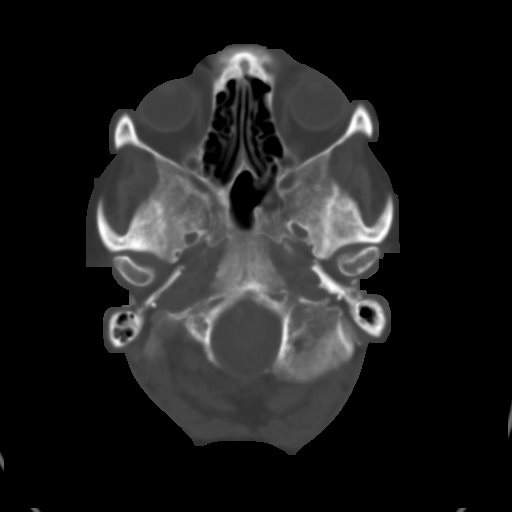
[im 4/32  brain]
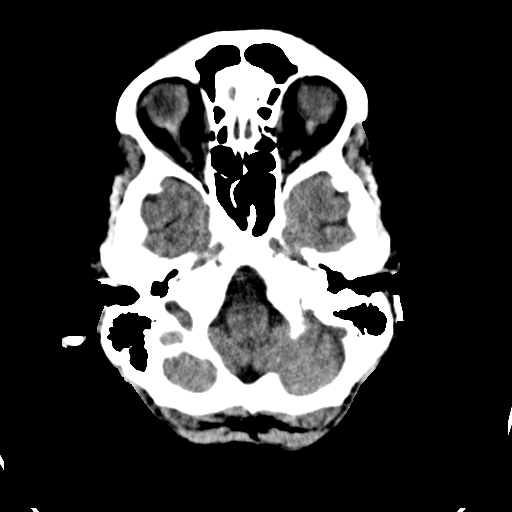
[im 6/32  brain]
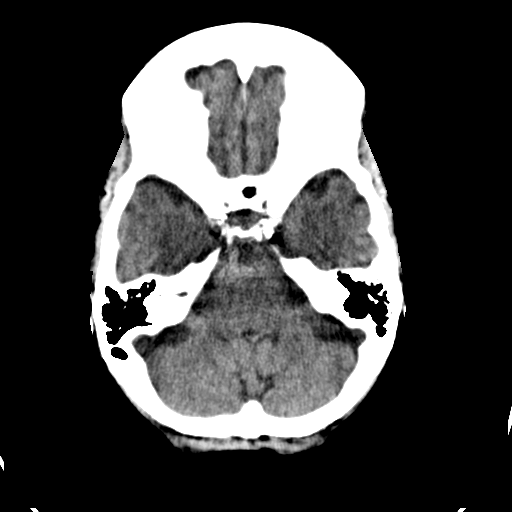
[im 8/32  brain]
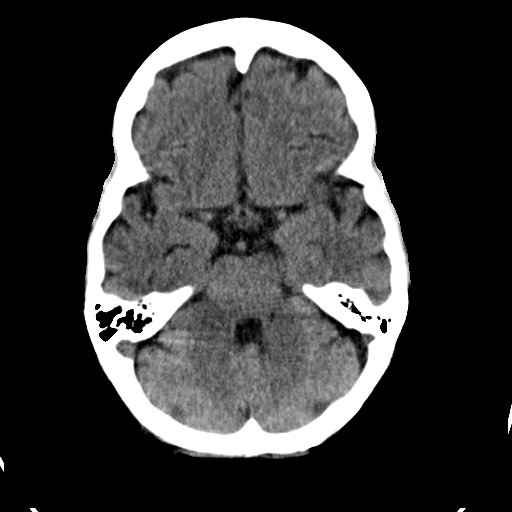
[im 9/32  brain]
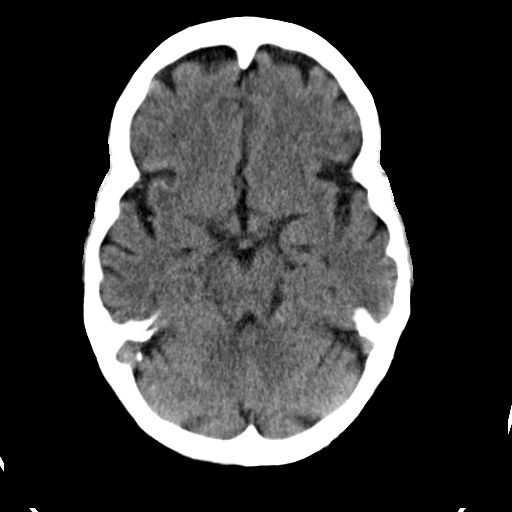
[im 9/32  bone]
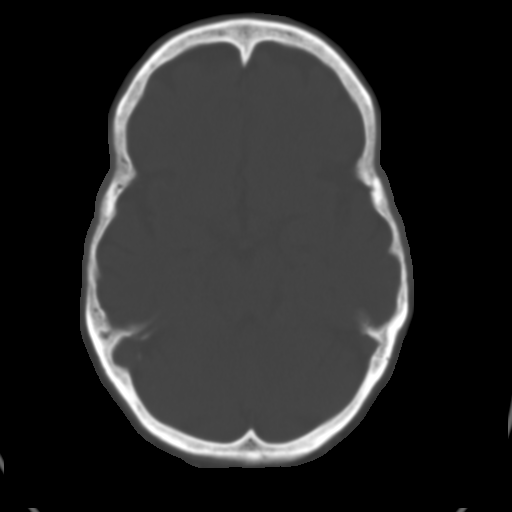
[im 11/32  brain]
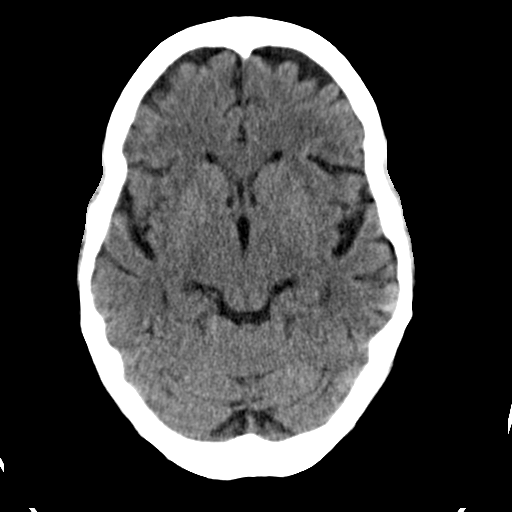
[im 13/32  brain]
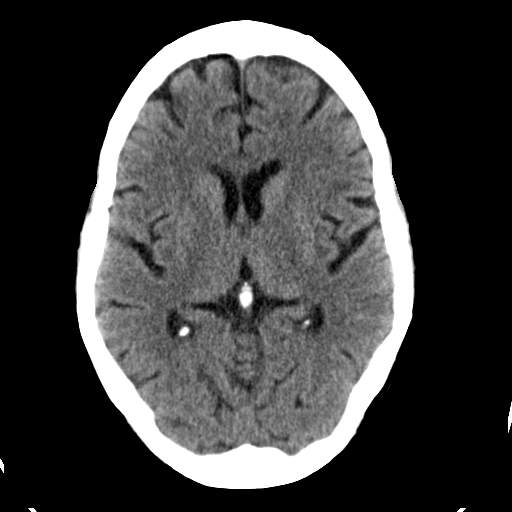
[im 15/32  brain]
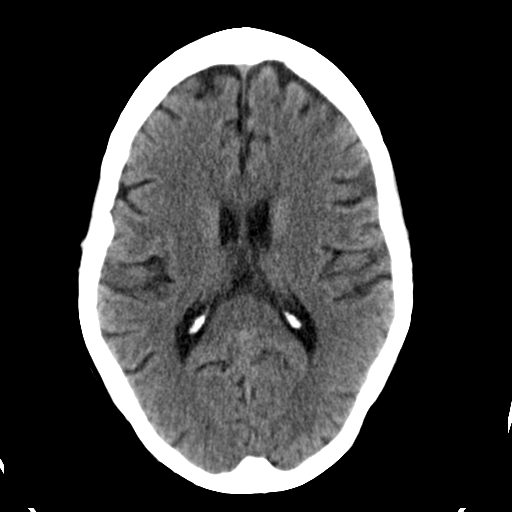
[im 17/32  brain]
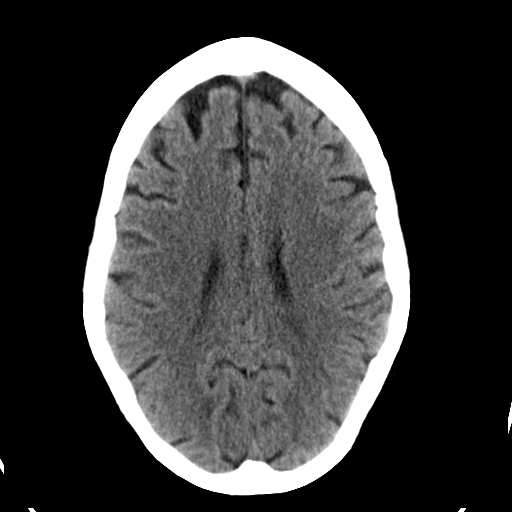
[im 17/32  bone]
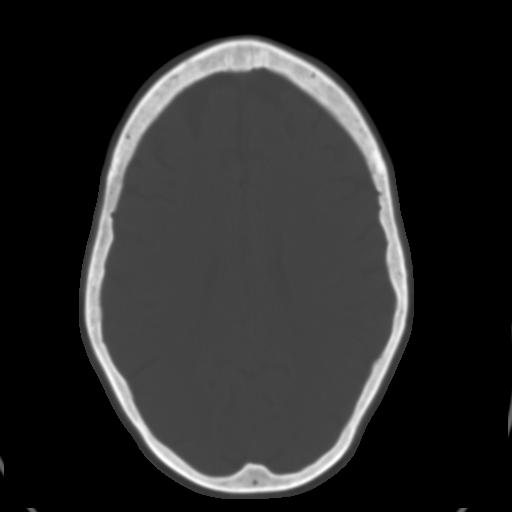
[im 19/32  brain]
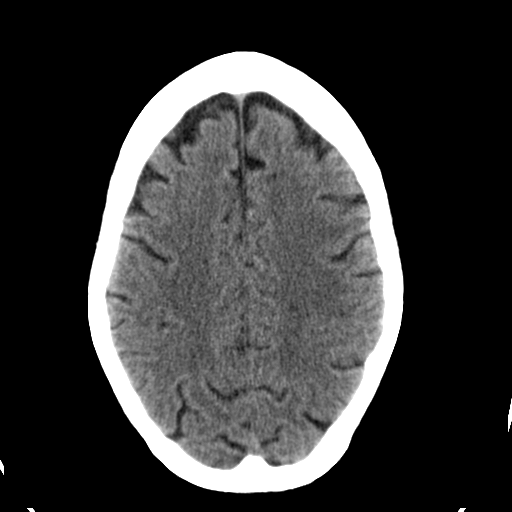
[im 21/32  brain]
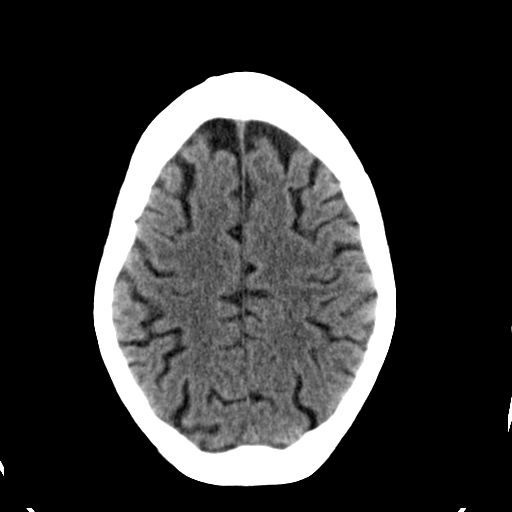
[im 23/32  brain]
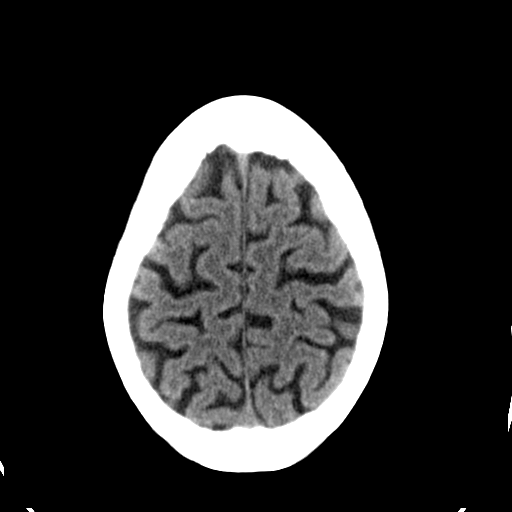
[im 24/32  brain]
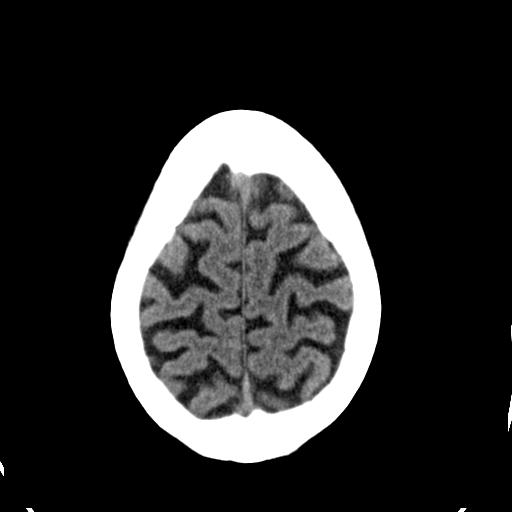
[im 24/32  bone]
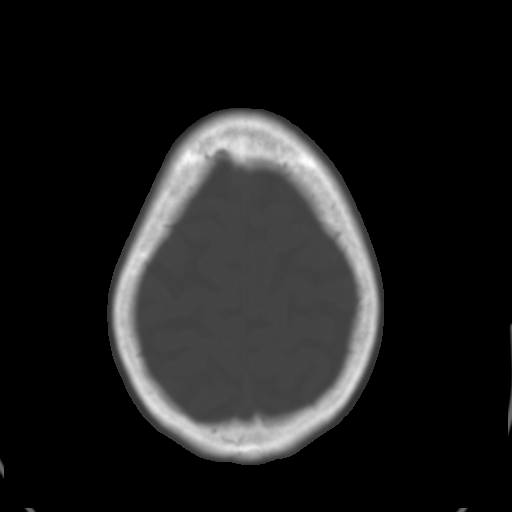
[im 26/32  brain]
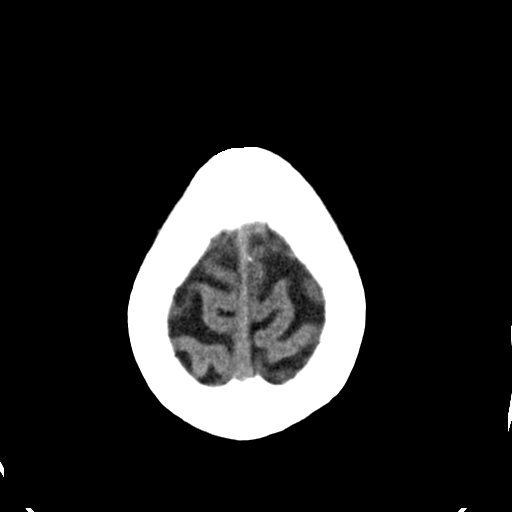
[im 28/32  brain]
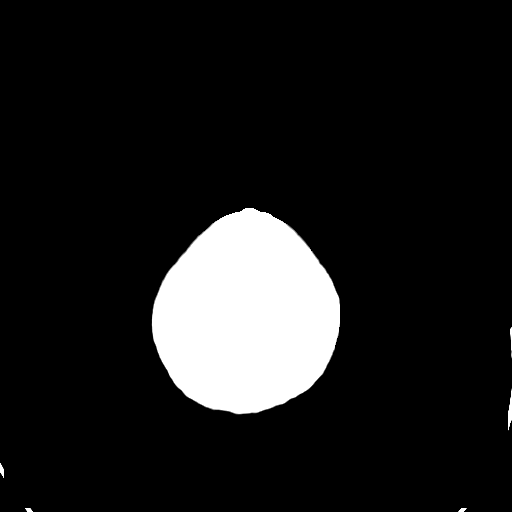
[im 30/32  brain]
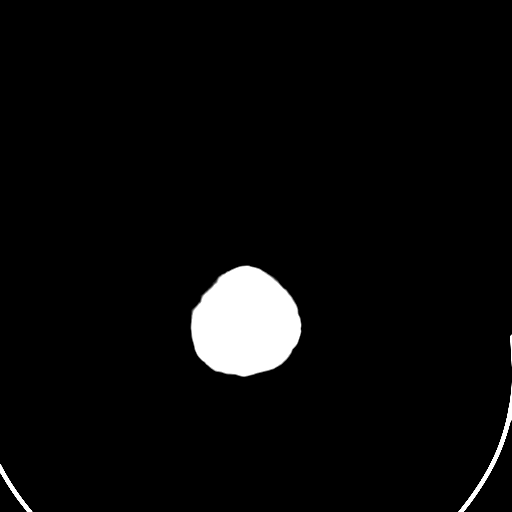

[16 of 30 positions shown; findings below may reference images not displayed]

FINDINGS: Sinuses/Soft tissues: No significant soft tissue swelling.
Hyperostosis frontalis interna. Clear paranasal sinuses and mastoid
air cells.

Intracranial: Expected cerebral atrophy. No mass lesion, hemorrhage,
hydrocephalus, acute infarct, intra-axial, or extra-axial fluid
collection.
IMPRESSION: No acute intracranial abnormality.

## 2014-04-21 MED ORDER — ANASTROZOLE 1 MG PO TABS
1 MG | ORAL_TABLET | Freq: Every day | ORAL | Status: DC
Start: 2014-04-21 — End: 2017-12-02

## 2014-04-21 NOTE — Progress Notes (Signed)
Judy Velasquez   Judy Velasquez is here for cancer of the right Breast. DCIS  Procedure: lumpectomy Date: 03/2011   Hormonal Therapy:y  Chemotherapy: n  Radiation Therapy: y    Current Evaluation:   Today the patient is basically feeling well. She denies new breast mass, nipple discharge, or breast pain. No new subcutaneous mass, headache, bone pain, new cough, or abdominal pain.     Physical Exam:   Generally healthy appearing, Caucasian female   Skin: no new subcutaneous mass   Breast: non-tender, no new mass   Lungs clear   Heart RRR  Lymphadenopathy: no new axillary and supraclavicular   Abdomen: non-tender without liver, spleen, kidney, or mass palpable     Mammography:   Last mammogram: 03/2014   Personally reviewed by me. Mammogram is unchanged from previous mammograms. Radiologist report is negative     Impression:   No evidence of systemic or recurrent breast cancer:   No evidence of second primary breast cancer     Plan: RTC 1year  Will need a mammogram in 1 year with a bone density

## 2014-04-21 NOTE — Addendum Note (Signed)
Addended by: Jolyne Loa on: 04/21/2014 02:24 PM     Modules accepted: Orders

## 2014-04-21 NOTE — Progress Notes (Signed)
Judy Velasquez has a pain level on 0/10 scale  0    Location Right Breast    Description none    Radiation   No    Duration  1 year(s)    Time  none

## 2014-07-08 LAB — RENAL FUNCTION PANEL
Albumin: 4 GM/DL (ref 3.4–5.0)
Anion Gap: 14 (ref 4–16)
BUN: 49 MG/DL — ABNORMAL HIGH (ref 6–23)
CO2: 23 MMOL/L (ref 21–32)
Calcium: 9.1 MG/DL (ref 8.3–10.6)
Chloride: 100 mMol/L (ref 99–110)
Creatinine: 2.5 MG/DL — ABNORMAL HIGH (ref 0.6–1.1)
GFR African American: 22 mL/min/{1.73_m2}
GFR Non-African American: 18 mL/min/{1.73_m2}
Glucose: 139 MG/DL (ref 70–140)
Phosphorus: 3.9 MG/DL (ref 2.5–4.9)
Potassium: 3.5 MMOL/L (ref 3.5–5.1)
Sodium: 137 MMOL/L (ref 136–145)

## 2014-10-05 DIAGNOSIS — M5416 Radiculopathy, lumbar region: Secondary | ICD-10-CM | POA: Insufficient documentation

## 2014-10-31 DIAGNOSIS — M5136 Other intervertebral disc degeneration, lumbar region: Secondary | ICD-10-CM | POA: Insufficient documentation

## 2014-10-31 DIAGNOSIS — M5116 Intervertebral disc disorders with radiculopathy, lumbar region: Secondary | ICD-10-CM | POA: Insufficient documentation

## 2015-01-14 NOTE — Discharge Summary (Signed)
PATIENT NAME:  Anna Simmons, Anna Simmons MR#:  [id number] DATE OF BIRTH:  [birth year]  DATE OF ADMISSION:  11/16/2013 DATE OF DISCHARGE:  11/19/2013  DISCHARGE DIAGNOSES: 1.  Encephalopathy secondary to hypoglycemia.  2.  Hypertension.  3.  Adult onset diabetes that is now diet controlled.  4.  History of anemia.  5.  History of coronary artery disease.  6.  Chronic renal insufficiency.  7.  Hypokalemia that is resolved.   DISCHARGE MEDICATIONS: 1.  Anastrozole 1 mg p.o. daily.  2.  Latanoprost 0.005% ophthalmic solution 1 drop in each effected eye at bedtime.  3.  Alendronate 70 mg p.o. weekly.  4.  Fexofenadine 180 mg p.o. daily as needed for allergies.  5.  Carvedilol 6.25 mg p.o. b.i.d.  6.  Diltiazem extended release 180 mg p.o. daily.  7.  Hydralazine 50 mg p.o. t.i.d.  8.  Ferrex 150 mg 1 capsule daily.  9.  Lasix 40 mg p.o. b.i.d.  10.  Losartan  25 mg p.o. daily.  11.  Pravastatin 10 mg p.o. at bedtime.  12.  Potassium chloride 20 mEq p.o. daily.   CONSULTANTS: None.   PROCEDURES: None.   PERTINENT LABORATORY AND STUDIES: The patient did have a carotid ultrasound done that showed a left-sided stenosis of 50% to 69%. CT of the head was negative.   On day of discharge, sodium 139, potassium 3.8, creatinine 1.69, and glucose is 77.   BRIEF HOSPITAL COURSE:  1.  Encephalopathy secondary to hypoglycemia. The patient initially came in with glucose in the 30s secondary to poor intake and continued use of her sulfonylurea. Her diabetic meds were held. Her blood sugars trended up, and she has been eating much better. Sugars have been ranging between the 70s and 200s. Will remain off of diabetic meds since her last A1c was around 5.9% in October 2014.  2.  Her other chronic medical issues remain stable at this time. We will continue with the statin therapy due to the stenosis seen of the left carotid. Will also continue with potassium until she is re-evaluated next week in the clinic.    DISPOSITION: She is in stable condition to be discharged to home without any home services. Follow up with Dr. Netty Starring within 10 days of discharge.   ____________________________ Dion Body, MD kl:sb D: 11/19/2013 10:02:29 ET T: 11/19/2013 12:29:08 ET JOB#: [id number]  cc: Dion Body, MD, <Dictator> Dion Body MD ELECTRONICALLY SIGNED 12/21/2013 15:20

## 2015-01-14 NOTE — H&P (Signed)
PATIENT NAME:  Anna Simmons, Anna Simmons MR#:  [id number] DATE OF BIRTH:  24-Jan-1929  DATE OF ADMISSION:  11/16/2013  PRIMARY CARE PHYSICIAN: Dr. Netty Starring.   REFERRING PHYSICIAN: Dr. Karma Greaser.    CHIEF COMPLAINT: Altered mental status.   HISTORY OF PRESENT ILLNESS: Anna Simmons is an 81 year old female with a history of coronary artery disease status post coronary artery bypass grafting, chronic kidney disease, diabetes mellitus on glipizide. Was found to be on the floor by her daughter. The patient states the last thing the patient remembers was around 5 in the evening was sitting at the dinner table. The patient's daughter found her around 6:30 in the evening on the floor, lying on her back. The patient was extremely confused at that time. Unable to obtain any history from the patient. Concerning this, EMS was called and was brought to the Emergency Department. Initial blood sugar was 32. The patient was given D50 with some improvement of the mental status; however, the patient continues to be confused. Per daughter, the patient at baseline is well conscious, oriented, communicates well. CT head showed no acute abnormality. No obvious signs of infection were found. The patient has baseline creatinine of 1.2, worsened to 1.99. The patient's CK is 45. The patient denies having any chest pain or palpitations.   PAST MEDICAL HISTORY:  1. Coronary artery disease, status post coronary artery bypass grafting.  2. Chronic kidney disease.  3. Diabetes mellitus, on oral medication.  4. Hypertension.  5. Anemia.  6. Glaucoma.  7. History of breast cancer, status post lumpectomy, radiation therapy, now on anastrozole.    PAST SURGICAL HISTORY:  1. Coronary artery bypass grafting.  2. C-section.  3. Hysterectomy.  4. Bilateral hip replacements.  5. Right knee replacement.  6. Right carotid endarterectomy.  7. Carpal tunnel release surgery in 2009.  8. Partial intestine removal.   ALLERGIES: CELEBREX.   HOME  MEDICATIONS:  1. Pravastatin 10 mg once a day.  2. Naphcon to each eye.   3. Lisinopril 10 mg once a day.  4. Levaquin 500 mg every 24 hours.  5. Latanoprost to the affected eye.  6. Hydralazine 10 mg 4 times a day.  7. Glipizide 10 mg once a day.  8. Folbee tablet once a day.  9. Fexofenadine 180 mg once a day.  10. Ferrex 150 mg once a day.  11. Diltiazem 240 mg once a day.  12. Coreg 6.25 mg 2 times a day.  13. Aspirin 325 mg once a day.  14. Anastrozole 1 mg once a day.  15. Alendronate 60 mg once a week.   SOCIAL HISTORY: No history of smoking, drinking alcohol or using illicit drugs. Lives with her disabled son.   FAMILY HISTORY: Father deceased at the age of 64, had diabetes mellitus. Mother with heart disease. Two sisters deceased from breast cancer.   REVIEW OF SYSTEMS: Unable to obtain from the patient with some baseline confusion.   PHYSICAL EXAMINATION:  GENERAL: This is a well-built, well-nourished, age-appropriate female lying down in the bed, not in distress.  VITAL SIGNS: Temperature 97.4, pulse 73, blood pressure , respiratory rate of 18, oxygen saturation 98% on room air.  HEENT: Head normocephalic, atraumatic. There is no scleral icterus. Conjunctivae normal. Pupils equal and reactive. Extraocular movements are intact. Mucous membranes: Mild dryness. No pharyngeal erythema.  NECK: Supple. No lymphadenopathy. No JVD. No carotid bruit.  CHEST: Has no focal tenderness.  LUNGS: Bilaterally clear to auscultation.  HEART: S1, S2 regular. No murmurs  are heard.  ABDOMEN: Bowel sounds present. Soft, nontender, nondistended. No hepatosplenomegaly.  EXTREMITIES: No pedal edema. Pulses 2+.  NEUROLOGIC: The patient is alert, oriented to self and person, not to place and time. Some sluggish response. Motor 5/5 in upper and lower extremities. No pronator drift.  SKIN: No rash or lesions.  MUSCULOSKELETAL: Good range of motion in all of the extremities.   LABORATORIES: BUN 26,  creatinine of 1.99. CBC: WBC of 11, hemoglobin of 10.4. The rest of all of the values are within normal limits. CK 45, CK-MB of 0.5. Troponins are negative. UA negative for nitrites and leukocyte esterase. Urine drug screen is negative. CT head without contrast: No acute intracranial abnormality.   ASSESSMENT AND PLAN: Anna Simmons is an 81 year old female who is brought to the Emergency Department with altered mental status, most likely from the hypoglycemia.  1. Hypoglycemia: This could be secondary to the patient's worsening renal function. Cause is uncertain. Continue with intravenous fluids with D5 and continue to monitor the patient's blood sugars. If the patient has subsequent blood sugars over 200, will discontinue the D5.   2. Diabetes mellitus: Will obtain hemoglobin A1c. For now hold the oral medications.  3. Altered mental status: Most likely, this is still from the hypoglycemia; however, considering the patient's extensive history of atherosclerotic disease including coronary artery disease status post bypass surgery and endarterectomy, will obtain MRI of the brain, carotid Dopplers, echocardiogram.  4. Debility: Will involve physical therapy, occupational therapy.  5. Hypertension, accelerated: Given 1 dose of hydralazine in the Emergency Department.  6. Atrial fibrillation: Rate is controlled. The patient is not on any anticoagulation.  7. Keep the patient on deep vein thrombosis prophylaxis with Lovenox.   TIME SPENT: 55 minutes.    ____________________________ Monica Becton, MD pv:gb D: 11/17/2013 01:35:06 ET T: 11/17/2013 02:54:52 ET JOB#: [id number]  cc: Monica Becton, MD, <Dictator> Dion Body, MD Monica Becton MD ELECTRONICALLY SIGNED 11/28/2013 22:39

## 2015-01-15 NOTE — H&P (Signed)
PATIENT NAME:  Anna Simmons, Anna Simmons MR#:  [id number] DATE OF BIRTH:  1929-02-19  DATE OF ADMISSION:  12/23/2011  PRIMARY CARE PHYSICIAN: Dion Body, MD  CHIEF COMPLAINT: Dry heaving and cough.   HISTORY OF PRESENT ILLNESS: This is an 81 year old female with multiple medical problems. She has coronary artery disease with previous coronary artery bypass graft, chronic kidney disease with baseline creatinine of around 1.7, diabetes, and hypertension. Today she presented to the emergency room complaining of nausea, dry heaving, and cough. She says this has been going on for a week. It started with cold-like symptoms. She is complaining of chest congestion, dry heaving. She lives in [place] for six months and she lives in [place] for six months. She recently came from [place] a week ago and started having these cold symptoms. Then she said they just worsened. She is having cough with deep whitish expectoration. She denies any pleuritic chest pain. She was also complaining of some fever and chills at home. She was continuing to dry heave and it has worsened in the last few days. She denies any vomiting thought. She is unable to tolerate p.o. because of the dry heaving. She says she has chest pain when she coughs and abdominal pain when she dry heaves. She denies any urinary complaints, but she says she is voiding less than usual despite taking Lasix. She denies any diarrhea. In the emergency room, she was noted to have a temperature of 100.4. She was also found to be dehydrated with her creatinine  and BUN worsening. Blood cultures were drawn in the emergency room and she has been started on ceftriaxone and Zithromax. The hospitalist service was asked to admit the patient because of pneumonia, acute on chronic renal failure, and poor p.o. intake.   REVIEW OF SYSTEMS: Positive for fever, chills, and weakness. No acute change in vision. No headache. She is also complaining of some dizziness and lightheadedness.  Complaining of cough and dyspnea. No hemoptysis. No chronic obstructive pulmonary disease. No chest pain. No palpitations or syncope. She is complaining of nausea and dry heaving but no vomiting, diarrhea, or abdominal pain. No dysuria or frequency, but mainly complaining of oliguria. She has history of anemia. No thyroid problems. No rash. No joint pains. No swelling. No focal numbness or weakness. No anxiety or depression.     PAST MEDICAL HISTORY:  1. Coronary artery disease with previous coronary artery bypass graft.  2. Chronic kidney disease. Her last BUN and creatinine at [facility] in February 2013 was a BUN of 34 and a creatinine of 1.7 with an eGFR greater than 60. 3. Diabetes. 4. Hypertension. 5. History of anemia. 6. Glaucoma.  7. History of breast cancer status post lumpectomy and radiation therapy, now on Anastrazole.   PAST SURGICAL HISTORY:  1. Coronary artery bypass graft in 2008. 2. Previous history of Cesarean section x3. 3. Hysterectomy in 1949. 4. Bilateral hip replacements.  5. Right knee replacement.  6. Right carotid endarterectomy in 2008. 7. Carpal tunnel release surgery in 2009. 8. Partial intestine removal.  DRUG ALLERGIES: Celebrex.   HOME MEDICATIONS:  1. Alendronate 70 mg once a week. 2. Anastrozole 1 mg daily.  3. Carvedilol 6.25 mg twice a day. 4. Cardizem CD 240 mg daily.  5. Ferrex 150 mg daily. 6. Fexofenadine 180 mg daily.  7. Titus Mould. 8. Glipizide 10 mg daily.  9. Hydralazine 10 mg four times daily. 10. Lasix 40 mg daily. 11. Latanoprost ophthalmic solution one drop to each affected eye  once a day at bedtime. 12. Naphcon ophthalmic solution.  13. MiraLax p.r.n.  14. Pravastatin 10 mg at bedtime.   SOCIAL HISTORY: She is widowed. She lives with her daughter in [place]. She also goes to [place] in the summertime. She denies any smoking or alcohol use.   FAMILY HISTORY: Father is deceased at the age of 63; her father was diabetic.  Mother is deceased at age of 3. She had history of heart disease. She has two sisters; one is deceased from breast cancer.   PHYSICAL EXAMINATION:   VITAL SIGNS: When she presented to the emergency room, her temperature was 100.4, heart rate 70, respiratory rate 18, blood pressure 130/61, and saturating 94% on room air. Currently her heart rate is 56, blood pressure 118/52, and saturating 97% on room air.   GENERAL: This is an elderly Caucasian female, thin built, comfortably lying in bed, in no acute distress.   HEENT: Bilateral pupils are equal. Extraocular muscles are intact. No scleral icterus. No conjunctivitis. Oral mucosa is slightly dry and pale.   NECK: No thyroid tenderness, enlargement, or nodule. Neck is supple. No masses and nontender. No adenopathy. No JVD. No carotid bruit.   CHEST: She has some coarse breath sounds and some rhonchi in the left base, but no wheezing. Normal respiratory effort.  No use of accessory muscles of respiration.   HEART: Heart sounds are regular, bradycardic. No murmur. Good peripheral pulses. No lower extremity edema.   ABDOMEN: Soft and nontender. Normal bowel sounds. No hepatomegaly. No bruit. No masses.   RECTAL: Deferred.   NEUROLOGIC: She is awake and alert. She is oriented to time, place, and person. Very pleasant. Good historian. Cranial nerves are intact. Moving all extremities against gravity.   EXTREMITIES: No cyanosis. No clubbing.   SKIN: No rash. No lesions.   LABS/STUDIES: White count 14,000, hemoglobin 12.6, and platelet count 147,000. BMP: Sodium 136, potassium 4.3, BUN 44, and creatinine 2.35. LFTs are normal.   Her urinalysis shows trace ketones, otherwise nitrite and leukocyte esterase are negative.   Her chest x-ray shows chronic obstructive pulmonary disease with left lung base pneumonia.   IMPRESSION:  1. Left lung base pneumonia.  2. Leukocytosis secondary to pneumonia. 3. Acute on chronic renal failure secondary to  dehydration. 4. Coronary artery disease. 5. Diabetes mellitus. 6. Hypertension. 7. Anemia. 8. History of breast cancer.   PLAN: An 81 year old female with history of coronary artery disease, chronic kidney disease with baseline creatinine of 1.7, diabetes, hypertension, and history of breast cancer. She presents with cough going on for a week associated with some expectoration, extreme dry heaving, decreased p.o. intake, and oliguria. She is found to have a left basilar pneumonia (community-acquired pneumonia) and white count is elevated, but she is not hypoxic. We will give her IV hydration. She has already been started on ceftriaxone and Zithromax. We will continue that. Send a sputum culture on her. Blood cultures have already been done, in the emergency room. Because of poor p.o. intake and prerenal etiology, her creatinine has worsened. I am going to hold her Lasix at this time and give her some gentle IV hydration and follow her renal function. Considering that her renal function has worsened, I will decrease the dose of glipizide also because higher doses of glipizide with worsened renal function can cause hypoglycemia. We will  give her Zofran p.r.n. for her nausea. We will start her with a clear liquid diet at this time. This can be advanced as she  tolerates p.o. The patient will be transferred to Dr. Raylene Miyamoto service.  TIME SPENT ON ADMISSION AND COORDINATION OF CARE: Approximately 45 minutes.  ____________________________ Mena Pauls, MD ag:slb D: 12/23/2011 09:50:54 ET T: 12/23/2011 10:43:28 ET JOB#: [id number]  cc: Mena Pauls, MD, <Dictator> Dion Body, MD Mena Pauls MD ELECTRONICALLY SIGNED 01/16/2012 11:44

## 2015-01-15 NOTE — Discharge Summary (Signed)
PATIENT NAME:  Anna Simmons, Anna Simmons MR#:  [id number] DATE OF BIRTH:  [birth year]  DATE OF ADMISSION:  12/23/2011 DATE OF DISCHARGE:  12/25/2011  DISCHARGE DIAGNOSES:  1. Left lower lobe pneumonia.  2. Acute renal failure, resolved.  3. Hypertension.  4. History of anemia.  5. History of coronary artery disease, status post coronary artery bypass graft.  6. Adult onset diabetes.  7. History of right breast cancer 2012.   DISCHARGE MEDICATIONS:  1. Anastrozole 1 mg p.o. daily.  2. Latanoprost 0.005% ophthalmic solution one drop in each affected eye once a day at bedtime.  3. Alendronate 70 mg p.o. once a weak.  4. Dilt extended release 240 mg p.o. daily.  5. Glipizide 10 mg p.o. daily.  6. Fexofenadine 180 mg p.o. daily.  7. Carvedilol 6.25 mg p.o. b.i.d.  8. Hydralazine 10 mg p.o. q.i.d.  9. Pravastatin 10 mg p.o. at bedtime.  10. Ferrex 150 p.o. daily.  11. Levaquin 500 mg p.o. daily x8 more days.  12. Aspirin 325 mg p.o. daily.  13. Lisinopril 10 mg p.o. daily.   MEDICATION TO HOLD: Lasix.   LABORATORY, DIAGNOSTIC AND RADIOLOGICAL DATA: Chest x-ray showed left lower lobe pneumonia and chronic obstructive pulmonary disease. On day of discharge sodium 144, potassium 4, BUN 24, creatinine 1.27, glucose 89, white blood cell count 8, hemoglobin 9.8, platelets 147.   BRIEF HOSPITAL COURSE:  1. Left lower lobe pneumonia. Patient initially came in complaining of productive cough and shortness of breath consistent with left lower lobe pneumonia on chest x-ray. She was started on ceftriaxone and azithromycin and responded very well. Initial white blood cell count was 14,000, trended down to 8000 on day of discharge. She was transitioned over to oral Levaquin 500 mg p.o. daily and she has eight more days. Patient did not require oxygen on discharge.  2. Acute renal failure. Patient initially came in with a creatinine of 2.35 and was placed on normal saline IV fluids 100 per hour and her kidneys  responded well. On the day of discharge creatinine 1.27. Will hold off on Lasix. She did not appear to be fluid overloaded on day of discharge.  3. Hypertension. Her blood pressure continues to fluctuate. She was restarted back on her Dilt and lisinopril and day of discharge the only thing that has been held is her Lasix at this time. Blood pressures have been as low as in the low 100s, as high as 190s. 4. Other chronic medical issues remained stable at this time, no changes.   DISPOSITION: She is in stable condition to be discharged to home.   FOLLOW UP: Follow up with Dr. Netty Starring 7 to 10 days.  ____________________________ Dion Body, MD kl:cms D: 12/25/2011 08:12:31 ET T: 12/25/2011 14:10:09 ET JOB#: [id number]  cc: Dion Body, MD, <Dictator> Dion Body MD ELECTRONICALLY SIGNED 12/25/2011 18:02

## 2015-03-20 LAB — RENAL FUNCTION PANEL
Albumin: 4 GM/DL (ref 3.4–5.0)
Anion Gap: 12 (ref 4–16)
BUN: 59 MG/DL — ABNORMAL HIGH (ref 6–23)
CO2: 30 MMOL/L (ref 21–32)
Calcium: 9.1 MG/DL (ref 8.3–10.6)
Chloride: 99 mMol/L (ref 99–110)
Creatinine: 2.5 MG/DL — ABNORMAL HIGH (ref 0.6–1.1)
GFR African American: 22 mL/min/{1.73_m2}
GFR Non-African American: 18 mL/min/{1.73_m2}
Glucose: 194 MG/DL — ABNORMAL HIGH (ref 70–140)
Phosphorus: 4.1 MG/DL (ref 2.5–4.9)
Potassium: 4.4 MMOL/L (ref 3.5–5.1)
Sodium: 138 MMOL/L (ref 135–145)

## 2015-04-12 ENCOUNTER — Encounter

## 2015-04-12 ENCOUNTER — Inpatient Hospital Stay: Admit: 2015-04-12 | Attending: Surgery | Primary: Internal Medicine

## 2015-04-12 DIAGNOSIS — Z1231 Encounter for screening mammogram for malignant neoplasm of breast: Secondary | ICD-10-CM

## 2015-04-20 ENCOUNTER — Ambulatory Visit: Admit: 2015-04-20 | Discharge: 2015-04-20 | Payer: MEDICARE | Attending: Surgery | Primary: Internal Medicine

## 2015-04-20 DIAGNOSIS — C50911 Malignant neoplasm of unspecified site of right female breast: Secondary | ICD-10-CM

## 2015-04-20 NOTE — Progress Notes (Signed)
Judy Velasquez     Judy Velasquez is here for cancer of the right Breast. DCIS  Procedure: lumpectomy Date: 03/2011   Hormonal Therapy:y  Chemotherapy: n  Radiation Therapy: y    Current Evaluation:   Today the patient is basically feeling well. She denies new breast mass, nipple discharge, or breast pain. No new subcutaneous mass, headache, bone pain, new cough, or abdominal pain.     Physical Exam:   Generally healthy appearing, Caucasian female   Skin: no new subcutaneous mass   Breast: non-tender, no new mass in either breast  Lungs clear   Heart RRR  Lymphadenopathy: no new axillary and supraclavicular   Abdomen: non-tender without liver, spleen, kidney, or mass palpable     Mammography:   Last mammogram: 03/2015   Personally reviewed by me. Mammogram is unchanged from previous mammograms. Radiologist report is negative     Impression:   No evidence of systemic or recurrent breast cancer:   No evidence of second primary breast cancer     Plan: RTC 1 year as she wil be in [place] for the winter  Will need a mammogram in 1 year

## 2015-04-20 NOTE — Progress Notes (Signed)
Judy Velasquez has a pain level on 0/10 scale  0    Location Breast    Description tender    Radiation   No    Duration  1 year(s)    Time  none

## 2015-04-27 ENCOUNTER — Emergency Department: Admit: 2015-04-28 | Primary: Internal Medicine

## 2015-04-27 ENCOUNTER — Emergency Department: Primary: Internal Medicine

## 2015-04-27 NOTE — ED Provider Notes (Addendum)
[facility] ED  eMERGENCY dEPARTMENT eNCOUnter        Pt Name: Judy Velasquez  MRN: [id number]  [place] 1928-10-21  Date of evaluation: 04/27/2015  Provider: Johny Shears, MD  PCP: [name], MD  ED Attending: Johny Shears, MD    CHIEF COMPLAINT       Chief Complaint   Patient presents with   ??? Chest Pain       HISTORY OF PRESENT ILLNESS   (Location/Symptom, Timing/Onset, Context/Setting, Quality, Duration, Modifying Factors, Severity)  Note limiting factors.     CONTEXT: Judy Velasquez is a 81 y.o. female with a history of hypertension, CAD, hyperlipidemia, diabetes, breast cancer, and chronic kidney disease pending for evaluation of chest pain.  She describes left-sided chest tightness beginning shortly after eating lunch today.  The pain is described as tightness and has subsequently relieved spontaneously.  No modifying factors are reported.  The pain lasted for 2-3 hours.  She denies a history of similar pain in the past.  She denies fever, cough, chills, back pain, abdominal pain, nausea, vomiting, diaphoresis and dyspnea.  She denies lower extremity swelling and pain.       Nursing Notes were all reviewed and agreed with or any disagreements were addressed  in the HPI.    REVIEW OF SYSTEMS    (2-9 systems for level 4, 10 or more for level 5)     Review of Systems   Constitutional: Negative for chills, fatigue and fever.   Respiratory: Positive for chest tightness. Negative for cough, shortness of breath and wheezing.    Cardiovascular: Positive for chest pain. Negative for palpitations and leg swelling.   Gastrointestinal: Negative for abdominal pain, constipation, diarrhea, nausea and vomiting.   All other systems reviewed and are negative.      Except as noted above in the HPI and ROS, all other systems were reviewed and negative.       PAST MEDICAL HISTORY     Past Medical History   Diagnosis Date   ??? Allergic rhinitis    ??? Anemia    ??? Anesthesia      Nausea/Vomiting Post Op In Past    ??? Anxiety    ??? Arthritis      s/p bilateral hip and right knee replacement sees Dr. Chriss Czar   ??? Back problem      "Occ Back Hurts"   ??? Blood transfusion    ??? CAD (coronary artery disease) 2008     Sees Dr. Brandt Loosen CABG x 3   ??? DCIS (ductal carcinoma in situ) 05/20/2011   ??? Glaucoma      Bilateral Eyes   ??? H/O cardiovascular stress test 05/20/11   ??? Heart attack ([place]) 2006   ??? HOH (hard of hearing)      Bilateral Ears   ??? Hyperlipidemia    ??? Hypertension    ??? IBS (irritable bowel syndrome)    ??? Kidney problem      "Sees  Dr. Maretta Bees For Blood Pressure And Kidneys"   ??? Nausea & vomiting      Nausea/Vomiting Post Op In Past   ??? Osteoarthritis    ??? Osteoporosis    ??? Right Breast Cancer Dx 7-12   ??? Shortness of breath on exertion    ??? Staph infection 1980's     "They sent me to a tropical infection doctor"   ??? TB (tuberculosis)      "Probably had it as a child,  my dad had it"   ??? Type II or unspecified type diabetes mellitus without mention of complication, not stated as uncontrolled Dx 1980's       SURGICAL HISTORY       Past Surgical History   Procedure Laterality Date   ??? Cesarean section  "Late 1950's or Early 1960's"     X 3   ??? Carpal tunnel release  2000's     Right   ??? Breast surgery  7-12     Right Breast Biopsy   ??? Joint replacement  1980's     Total Right Hip   ??? Joint replacement  2000's     Total Right Hip   ??? Joint replacement  1990's     Total Left Hip   ??? Joint replacement  1980's     Total Right Knee   ??? Other surgical history       Family Physician Is Dr. Evelina Bucy In [place], [place]   ??? Hysterectomy, total abdominal  1960's   ??? Cardiac surgery  2008     CABG (3 Bypasses)   ??? Other surgical history  2000's     "Took section of my intestines out, it was a exploratory operation, they found scar tissue from the c-sections"   ??? Colonoscopy  2000's   ??? Endoscopy, colon, diagnostic  1990's   ??? Tonsillectomy and adenoidectomy  1939   ??? Cholecystectomy, laparoscopic  1990's   ??? Breast lumpectomy  05/20/2011      Right   ??? Coronary artery bypass graft  2008     cabg x 3         CURRENT MEDICATIONS       Previous Medications    ANASTROZOLE (ARIMIDEX) 1 MG TABLET    Take 1 tablet by mouth daily    ATORVASTATIN (LIPITOR) 20 MG TABLET    Take 20 mg by mouth daily    CARVEDILOL (COREG) 6.25 MG TABLET    Take 3.125 mg by mouth 2 times daily     DILTIAZEM (TIAZAC) 240 MG SR CAPSULE    Take 240 mg by mouth every evening.      FEXOFENADINE (ALLEGRA) 180 MG TABLET    Take 180 mg by mouth every morning.    FLUTICASONE (FLONASE) 50 MCG/ACT NASAL SPRAY    1 spray by Nasal route daily    FUROSEMIDE (LASIX) 40 MG TABLET    Take 40 mg by mouth 2 times daily.    HYDRALAZINE (APRESOLINE) 10 MG TABLET    Take 20 mg by mouth 3 times daily. Two Tablets Twice Daily    LATANOPROST (XALATAN) 0.005 % OPHTHALMIC SOLUTION    Place 1 drop into both eyes nightly.    NAPHAZOLINE-PHENIRAMINE (NAPHCON-A OP)    Apply  to eye as needed. Over The Counter     PIOGLITAZONE (ACTOS) 15 MG TABLET    Take 15 mg by mouth daily    POLYSACCHARIDE IRON COMPLEX (FERREX 150 PO)    Take  by mouth nightly.           ALLERGIES     Celebrex [celecoxib]; Lisinopril; Neurontin [gabapentin]; Norvasc [amlodipine besylate]; Other; Ciprofloxacin; and Lipitor    FAMILY HISTORY       Family History   Problem Relation Age of Onset   ??? Heart Disease Mother    ??? Cancer Mother      "Cancer In Her Eye"   ??? Depression Mother    ??? Mental Illness Mother    ???  Diabetes Father    ??? Heart Disease Father      "Heart Attack"   ??? Hearing Loss Father    ??? Cancer Sister      "Breast Cancer"   ??? Early Death Brother      "At [employer]"   ??? Diabetes Sister    ??? Vision Loss Son      "Eye Problems"   ??? Other Son      "Alot of health problems due to motorcycle wreck"   ??? Arthritis Daughter    ??? Early Death Son      "Early 56's"          SOCIAL HISTORY       Social History     Social History   ??? Marital status: Widowed     Spouse name: N/A   ??? Number of children: N/A   ??? Years of education: N/A     Social  History Main Topics   ??? Smoking status: Never Smoker   ??? Smokeless tobacco: Never Used   ??? Alcohol use No   ??? Drug use: No   ??? Sexual activity: No     Other Topics Concern   ??? None     Social History Narrative    Do you donate blood or plasma? No    Caffeine intake? Moderate    Advance directive? No    Is blood transfusion acceptable in an emergency? Yes    Live alone or with others? With others    Sunscreen used routinely? No able to care for self? Yes               SCREENINGS    Glasgow Coma Scale  Eye Opening: Spontaneous  Best Verbal Response: Oriented  Best Motor Response: Obeys commands  Glasgow Coma Scale Score: 15        PHYSICAL EXAM    (up to 7 for level 4, 8 or more for level 5)       ED Triage Vitals   BP Temp Temp src Pulse Resp SpO2 Height Weight   04/27/15 2113 -- -- 04/27/15 2113 04/27/15 2113 04/27/15 2113 -- --   152/46   68 18 96 %         Physical Exam   Constitutional: She is oriented to person, place, and time. She appears well-developed and well-nourished.   HENT:   Head: Normocephalic and atraumatic.   Nose: Nose normal.   Mouth/Throat: Oropharynx is clear and moist.   Eyes: Conjunctivae and EOM are normal. Pupils are equal, round, and reactive to light.   Neck: Normal range of motion. Neck supple.   Cardiovascular: Normal rate, regular rhythm and intact distal pulses.    Pulmonary/Chest: Effort normal and breath sounds normal. No respiratory distress. She has no wheezes. She has no rales. She exhibits no tenderness.   Abdominal: Soft. Bowel sounds are normal. There is no tenderness. There is no rebound and no guarding.   Musculoskeletal: Normal range of motion. She exhibits no edema or tenderness.   Neurological: She is alert and oriented to person, place, and time.   Skin: Skin is warm and dry.   Psychiatric: She has a normal mood and affect.   Vitals reviewed.      DIAGNOSTIC RESULTS     LABS ORDERED AND REVIEWED BY ME:    Labs Reviewed   CBC WITH AUTO DIFFERENTIAL - Abnormal; Notable for  the following:     RBC 2.83 (*)  Hemoglobin 9.4 (*)     Hematocrit 27.7 (*)     MCH 33.3 (*)     Segs Relative 72.1 (*)     Lymphocytes Relative 14.8 (*)     Monocytes Relative 10.4 (*)     All other components within normal limits    Narrative:     Performed @ [address], [place] [address]   COMPREHENSIVE METABOLIC PANEL - Abnormal; Notable for the following:     CO2 19 (*)     BUN 65 (*)     CREATININE 2.5 (*)     Glucose, Fasting 207 (*)     ALT 6 (*)     Anion Gap 19 (*)     All other components within normal limits    Narrative:     Performed @ [address] [place] [address]   D-DIMER, QUANTITATIVE - Abnormal; Notable for the following:     D-Dimer, Quant 551 (*)     All other components within normal limits    Narrative:     (NOTE)  A normal D-Dimer (<=230ng/ml DDU) has a negative predictive value  of 95% for the exclusion of acute pulmonary embolism(PE) or deep  vein thrombosis when used in conjunction with a clinical pretest  probability assessement model to exclude venous  thromboembolism(VTE) in patients suspected with deep vein  thrombosis(DVT) and Pulmonary embolism(PE).    Performed @ [address] [place] [address]   BRAIN NATRIURETIC PEPTIDE - Abnormal; Notable for the following:     Pro-BNP 3316 (*)     All other components within normal limits    Narrative:     (NOTE)  WE HAVE CONVERTED FROM BNP TO NT-proBNP.  Our reference range to "RULE OUT" acute CHF is <300pg/ml.    To "RULE IN" acute CHF please use the following reference ranges  derived from the PRIDE study.  <50 yrs  >450pg/ml  50-75 yrs    >900pg/ml  >75 yrs  >1800pg/ml      [place] [address]   TROPONIN    Narrative:     (NOTE)  WE HAVE CONVERTED FROM TROPONIN I TO TROPONIN T. THE NEW  REFERENCE RANGE IS <0.010 NG/ML WITH THE NEW CRITICAL VALUE  GREATER THAN OR EQUAL TO 0.100 NG/ML.    Performed @ [address], [place] [address]   TROPONIN   TROPONIN       All other labs were within normal range or not returned as of this dictation.    EKG: All EKG's are interpreted by the Emergency Department Physician who either signs or Co-signs this chart in the absence of a cardiologist.  Please see their note for interpretation of EKG.    Sinus rhythm with first-degree AV block at 67 BPM.  Questionable hyperacute anterior T waves.  No ST elevations.  No T-wave inversions.  + LVH.  Q waves in V1 and V2 suspicious for a septal infarct.  Interpretation: Abnormal EKG. No significant acute ischemic changes are seen when compared with the prior EKG from 08/13/11.    IMAGING ORDERED AND REVIEWED BY ME:  Non-plain film images such as CT, Ultrasound and MRI are read by the radiologist.     []  The following radiograph was interpreted by myself in the absence of a radiologist:  [x]  Radiologist's Report Reviewed:       Interpretation per the Radiologist below,  if available at the time of this note:    NM Lung Ventilation Vq Perfusion   Final Result   1. Low probability for pulmonary embolism.   2. Clumping of inhaled activity in the central airways, potentially related   to airways disease or inspiratory effort.         XR Chest Standard TWO VW   Final Result   No acute cardiopulmonary disease.           Xr Chest Standard Two Vw    Result Date: 04/27/2015  EXAMINATION: TWO VIEWS OF THE CHEST  04/27/2015 9:40 pm  COMPARISON: 10/23/2009.  HISTORY: ORDERING SYSTEM PROVIDED HISTORY: Chest pain TECHNOLOGIST PROVIDED HISTORY: Ordering Physician Provided Reason for Exam: Chest pain, pt stated hx CHF, CABG Acuity: Acute Type of Exam: Initial Relevant Medical/Surgical History: Chest pain, pt stated hx CHF, CABG  FINDINGS: The cardiomediastinal silhouette is unremarkable.  Post sternotomy changes are noted.  Advanced atherosclerotic changes throughout the aorta.  The lungs are clear.  No infiltrate, pleural fluid or failure.  Post cholecystectomy clips are present.   No acute osseous findings.  Osteopenia and multilevel thoracic spondylosis.     No acute cardiopulmonary disease.         PROCEDURES   Unless otherwise noted below, none     Procedures      CONSULTS:  IP CONSULT TO HOSPITALIST      EMERGENCY DEPARTMENT COURSE and DIFFERENTIAL DIAGNOSIS/MDM:   Vitals:    Vitals:    04/27/15 2326 04/27/15 2327 04/27/15 2342 04/28/15 0101   BP: 149/45  (!) 138/36    Pulse: 54 55 51 (!) 46   Resp: 15 17 17 15    SpO2:    100%       Patient was given the following medications:  Medications   nitroGLYCERIN (NITROSTAT) SL tablet 0.4 mg (not administered)   technetium albumin aggregated (MAA) solution 3 millicurie (3 millicuries Intravenous Given 04/28/15 0033)   technetium 25m DTPA solution 40 millicurie (40 millicuries Inhalation Given 04/28/15 0000)         Multiple diagnoses were considered in the evaluation of this patient.  Labs and studies were ordered and reviewed in accordance with the patient's presenting complaint.        MEDICAL DECISION MAKING:    ?? See orders for labs and imaging studies.   ?? See orders for medications ordered and administered in the ED.    Aspirin was not given secondary to patient reported allergy.    VQ scan negative for PE.    EKG does not appear acutely ischemic.  The patient's pain is resolved.  Initial troponin was normal.  Chest x-ray was nondiagnostic for symptoms.    HEART SCORE:    HISTORY ?? Highly Suspicious  ?? Moderately Suspicious  ?? Slightly or Non-Suspicious ?? 2 points  ?? 1 point  ?? 0 points   ECG ?? Significant ST Depression  ?? Nonspecific Repolarization  ?? Normal ?? 2 points  ?? 1 point  ?? 0 points   Age ?? Greater than or equal to 65 years  ?? Greater than 45 or Less than 65 years  ?? Less than or Equal to 45 years ?? 2 points  ?? 1 point  ?? 0 points   Risk Factors ?? 3+ Risk Factors or History of CAD  ?? 1 or 2 Risk Factors  ?? No Risk Factors ?? 2 points  ?? 1 point  ?? 0 points   Troponin ??  Greater than or Equal to 3x Normal Limit  ?? > 1 - < 3x Normal  Limit  ?? Less than or Equal to Normal Limit ?? 2 points  ?? 1 point  ?? 0 points     TOTAL: 4     Risk Factors: DM, current or recent (< 1 month) smoker, HTN, HLP, Family Hx of CAD and obesity.     Score 0-3: 2.5% MACE over the next 6 weeks ? Discharge Home  Score 4-6: 20.3% MACE over the next 6 weeks ? Admit for Clinical Observation  Score 7-10: 72.7% MACE over the next 6 weeks ? Early Invasive Strategies    MACE (major adverse cardiac events) = AMI, PCI, CABG, and death    Multiple diagnoses were considered in the evaluation of this patient.  Labs and studies were ordered and reviewed in accordance with the patient's presenting complaint.        FINAL IMPRESSION      1. Chest pain, unspecified type          Alternative diagnoses considered as listed above but appear less less likely considering the data gathered during this evaluation.    DISPOSITION/PLAN     DISPOSITION Decision To Admit    Most recent vitals:  Blood pressure (!) 138/36, pulse (!) 46, resp. rate 15, SpO2 100 %, not currently breastfeeding.        Per my evaluation, no further testing or consultation is required in the emergency department at this time.      DISCUSSIONS WITH OTHER PHYSICIANS:    Admitting Physician: Dr. Maren Reamer  This case was discussed with the admitting physician (or their representative per hospital policy) who agrees with plan and accepts patient for admission.        CRITICAL CARE TIME   N/A    OLD RECORDS REVIEWED BY ME:  Mam Tomo Digital Screen Bilateral    Result Date: 04/13/2015  EXAMINATION: SCREENING DIGITAL BILATERAL  MAMMOGRAM WITH TOMOSYNTHESIS 04/12/2015  TECHNIQUE: Screening mammography of the bilateral breasts was performed with tomosynthesis.  2D standard and 3D tomosynthesis combination imaging performed through both breasts in the MLO and CC projection.  Computer aided detection was utilized in the interpretation of this exam.  COMPARISON: April 06, 2014 and April 05, 2013  HISTORY: Screening.  FINDINGS: The breasts  are heterogeneously dense, which may obscure small masses. Within that limitation, there is no dominant mass, suspicious microcalcification, or area of architectural distortion.     No mammographic evidence of malignancy.  BIRADS: BIRADS - CATEGORY 1  Negative, no evidence of malignancy.  Normal interval follow-up is recommended in 12 months.  OVERALL ASSESSMENT - NEGATIVE  A letter of notification will be sent to the patient regarding the results.  The [employer] recommends annual mammograms for women 40 years and older.            (Please note that portions of this note were completed with a voice recognition program.  Efforts were made to edit the dictations but occasionally words are mis-transcribed.)    Renad Jenniges Blanchie Dessert, MD (electronically signed)        ORDERS:  Orders Placed This Encounter   Procedures   ??? XR Chest Standard TWO VW   ??? NM Lung Ventilation Vq Perfusion   ??? CBC Auto Differential   ??? Comprehensive Metabolic Panel   ??? D-Dimer, Quantitative   ??? Troponin   ??? Brain Natriuretic Peptide   ??? Troponin   ???  Inpatient consult to Hospitalist   ??? Insert peripheral IV

## 2015-04-27 NOTE — ED Notes (Signed)
Bed: 06  Expected date:   Expected time:   Means of arrival:   Comments:  Medic 30 chest pain     [name]  04/27/15 2107

## 2015-04-27 NOTE — ED Notes (Signed)
Patient resting on ER cot. No acute distress noted. Denied any chest pressure upon arriving. States it was gone after 02 applied.  02 saturation 98% at this time. Respirations even and unlabored. Family at bedside. Water given per request.      Angelica Ran, RN  04/27/15 2250

## 2015-04-27 NOTE — ED Triage Notes (Signed)
Patient states she was out to steak and shake earlier in the day having lunch, states she began having mid to left chest pressure shortly after eating. States pain was gone after squad applied 02 via nasal canula. Denied shortness of breath at this time. No active acute distress noted.

## 2015-04-28 ENCOUNTER — Inpatient Hospital Stay: Admit: 2015-04-28 | Discharge: 2015-04-28 | Source: Home / Self Care | Admitting: Emergency Medicine

## 2015-04-28 DIAGNOSIS — R079 Chest pain, unspecified: Secondary | ICD-10-CM | POA: Insufficient documentation

## 2015-04-28 LAB — COMPREHENSIVE METABOLIC PANEL
ALT: 6 U/L — ABNORMAL LOW (ref 10–40)
AST: 16 IU/L (ref 15–37)
Albumin: 4.1 GM/DL (ref 3.4–5.0)
Alkaline Phosphatase: 54 IU/L (ref 40–129)
Anion Gap: 19 — ABNORMAL HIGH (ref 4–16)
BUN: 65 MG/DL — ABNORMAL HIGH (ref 6–23)
CO2: 19 MMOL/L — ABNORMAL LOW (ref 21–32)
Calcium: 9 MG/DL (ref 8.3–10.6)
Chloride: 99 mMol/L (ref 99–110)
Creatinine: 2.5 MG/DL — ABNORMAL HIGH (ref 0.6–1.1)
GFR African American: 22 mL/min/{1.73_m2}
GFR Non-African American: 18 mL/min/{1.73_m2}
Glucose, Fasting: 207 MG/DL — ABNORMAL HIGH (ref 70–99)
Potassium: 4 MMOL/L (ref 3.5–5.1)
Sodium: 137 MMOL/L (ref 135–145)
Total Bilirubin: 0.4 MG/DL (ref 0.0–1.0)
Total Protein: 6.4 GM/DL (ref 6.4–8.2)

## 2015-04-28 LAB — CBC WITH AUTO DIFFERENTIAL
Basophils %: 0.2 % (ref 0–1)
Basophils Absolute: 0 10*3/uL
Eosinophils %: 2.5 % (ref 0–3)
Eosinophils Absolute: 0.2 10*3/uL
Hematocrit: 27.7 % — ABNORMAL LOW (ref 37–47)
Hemoglobin: 9.4 GM/DL — ABNORMAL LOW (ref 12.5–16.0)
Lymphocytes %: 14.8 % — ABNORMAL LOW (ref 24–44)
Lymphocytes Absolute: 0.9 10*3/uL
MCH: 33.3 PG — ABNORMAL HIGH (ref 27–31)
MCHC: 34 % (ref 32.0–36.0)
MCV: 97.9 FL (ref 78–100)
MPV: 8 FL (ref 7.5–11.1)
Monocytes %: 10.4 % — ABNORMAL HIGH (ref 0–4)
Monocytes Absolute: 0.7 10*3/uL
Platelets: 178 10*3/uL (ref 140–440)
RBC: 2.83 10*6/uL — ABNORMAL LOW (ref 4.2–5.4)
RDW: 12.3 % (ref 11.7–14.9)
Segs Absolute: 4.6 10*3/uL
Segs Relative: 72.1 % — ABNORMAL HIGH (ref 36–66)
WBC: 6.4 10*3/uL (ref 4.0–10.5)

## 2015-04-28 LAB — TROPONIN
Troponin T: <0.01 NG/ML (ref <0.01)
Troponin T: <0.01 NG/ML (ref <0.01)
Troponin T: 0.011 NG/ML — ABNORMAL HIGH (ref <0.01)

## 2015-04-28 LAB — POCT GLUCOSE: POC Glucose: 118 MG/DL — ABNORMAL HIGH (ref 70–99)

## 2015-04-28 LAB — BRAIN NATRIURETIC PEPTIDE: Pro-BNP: 3316 PG/ML — ABNORMAL HIGH (ref <300)

## 2015-04-28 LAB — D-DIMER, QUANTITATIVE: D-Dimer, Quant: 551 NG/mL(DDU) — ABNORMAL HIGH (ref <230)

## 2015-04-28 MED ORDER — INSULIN LISPRO 100 UNIT/ML SC SOLN
100 UNIT/ML | Freq: Every evening | SUBCUTANEOUS | Status: DC
Start: 2015-04-28 — End: 2015-04-28

## 2015-04-28 MED ORDER — LATANOPROST 0.005 % OP SOLN
0.005 % | Freq: Every evening | OPHTHALMIC | Status: DC
Start: 2015-04-28 — End: 2015-04-28

## 2015-04-28 MED ORDER — ATORVASTATIN CALCIUM 20 MG PO TABS
20 MG | Freq: Every day | ORAL | Status: DC
Start: 2015-04-28 — End: 2015-04-28
  Administered 2015-04-28: 13:00:00 20 mg via ORAL

## 2015-04-28 MED ORDER — CETIRIZINE HCL 10 MG PO TABS
10 MG | Freq: Every day | ORAL | Status: DC
Start: 2015-04-28 — End: 2015-04-28
  Administered 2015-04-28: 13:00:00 10 mg via ORAL

## 2015-04-28 MED ORDER — POTASSIUM CHLORIDE 10 MEQ/100ML IV SOLN
10 MEQ/0ML | INTRAVENOUS | Status: DC | PRN
Start: 2015-04-28 — End: 2015-04-28

## 2015-04-28 MED ORDER — NORMAL SALINE FLUSH 0.9 % IV SOLN
0.9 % | Freq: Two times a day (BID) | INTRAVENOUS | Status: DC
Start: 2015-04-28 — End: 2015-04-28
  Administered 2015-04-28: 13:00:00 10 mL via INTRAVENOUS

## 2015-04-28 MED ORDER — GLUCOSE 40 % PO GEL
40 % | ORAL | Status: DC | PRN
Start: 2015-04-28 — End: 2015-04-28

## 2015-04-28 MED ORDER — MAGNESIUM HYDROXIDE 400 MG/5ML PO SUSP
400 MG/5ML | Freq: Every day | ORAL | Status: DC | PRN
Start: 2015-04-28 — End: 2015-04-28

## 2015-04-28 MED ORDER — FUROSEMIDE 40 MG PO TABS
40 MG | Freq: Two times a day (BID) | ORAL | Status: DC
Start: 2015-04-28 — End: 2015-04-28
  Administered 2015-04-28: 13:00:00 40 mg via ORAL

## 2015-04-28 MED ORDER — FLUTICASONE PROPIONATE 50 MCG/ACT NA SUSP
50 MCG/ACT | Freq: Every day | NASAL | Status: DC
Start: 2015-04-28 — End: 2015-04-28
  Administered 2015-04-28: 13:00:00 1 via NASAL

## 2015-04-28 MED ORDER — INSULIN LISPRO 100 UNIT/ML SC SOLN
100 UNIT/ML | Freq: Three times a day (TID) | SUBCUTANEOUS | Status: DC
Start: 2015-04-28 — End: 2015-04-28

## 2015-04-28 MED ORDER — ASPIRIN 81 MG PO CHEW
81 MG | Freq: Every day | ORAL | Status: DC
Start: 2015-04-28 — End: 2015-04-28

## 2015-04-28 MED ORDER — NITROGLYCERIN 0.4 MG SL SUBL
0.4 MG | SUBLINGUAL | Status: DC | PRN
Start: 2015-04-28 — End: 2015-04-28

## 2015-04-28 MED ORDER — ONDANSETRON HCL 4 MG/2ML IJ SOLN
4 MG/2ML | Freq: Four times a day (QID) | INTRAMUSCULAR | Status: DC | PRN
Start: 2015-04-28 — End: 2015-04-28

## 2015-04-28 MED ORDER — GLUCAGON HCL RDNA (DIAGNOSTIC) 1 MG IJ SOLR
1 MG | INTRAMUSCULAR | Status: DC | PRN
Start: 2015-04-28 — End: 2015-04-28

## 2015-04-28 MED ORDER — HYDRALAZINE HCL 10 MG PO TABS
10 MG | Freq: Three times a day (TID) | ORAL | Status: DC
Start: 2015-04-28 — End: 2015-04-28
  Administered 2015-04-28: 13:00:00 20 mg via ORAL

## 2015-04-28 MED ORDER — CARVEDILOL 3.125 MG PO TABS
3.125 MG | Freq: Two times a day (BID) | ORAL | Status: DC
Start: 2015-04-28 — End: 2015-04-28
  Administered 2015-04-28: 13:00:00 3.125 mg via ORAL

## 2015-04-28 MED ORDER — POTASSIUM CHLORIDE CRYS ER 20 MEQ PO TBCR
20 MEQ | ORAL | Status: DC | PRN
Start: 2015-04-28 — End: 2015-04-28

## 2015-04-28 MED ORDER — DILTIAZEM HCL ER COATED BEADS 240 MG PO CP24
240 MG | Freq: Every evening | ORAL | Status: DC
Start: 2015-04-28 — End: 2015-04-28

## 2015-04-28 MED ORDER — TECHNETIUM 99M DTPA
Freq: Once | Status: AC | PRN
Start: 2015-04-28 — End: 2015-04-28
  Administered 2015-04-28: 04:00:00 40 via RESPIRATORY_TRACT

## 2015-04-28 MED ORDER — NORMAL SALINE FLUSH 0.9 % IV SOLN
0.9 % | INTRAVENOUS | Status: DC | PRN
Start: 2015-04-28 — End: 2015-04-28

## 2015-04-28 MED ORDER — ANASTROZOLE 1 MG PO TABS
1 MG | Freq: Every day | ORAL | Status: DC
Start: 2015-04-28 — End: 2015-04-28
  Administered 2015-04-28: 13:00:00 1 mg via ORAL

## 2015-04-28 MED ORDER — DEXTROSE 50 % IV SOLN
50 % | INTRAVENOUS | Status: DC | PRN
Start: 2015-04-28 — End: 2015-04-28

## 2015-04-28 MED ORDER — DEXTROSE 5 % IV SOLN
5 % | INTRAVENOUS | Status: DC | PRN
Start: 2015-04-28 — End: 2015-04-28

## 2015-04-28 MED ORDER — POTASSIUM CHLORIDE 20 MEQ/15ML (10%) PO SOLN
20 MEQ/15ML (10%) | ORAL | Status: DC | PRN
Start: 2015-04-28 — End: 2015-04-28

## 2015-04-28 MED ORDER — TECHNETIUM TO 99M ALBUMIN AGGREGATED
Freq: Once | Status: AC | PRN
Start: 2015-04-28 — End: 2015-04-28
  Administered 2015-04-28: 05:00:00 3 via INTRAVENOUS

## 2015-04-28 MED ORDER — ACETAMINOPHEN 325 MG PO TABS
325 MG | ORAL | Status: DC | PRN
Start: 2015-04-28 — End: 2015-04-28

## 2015-04-28 MED ORDER — ENOXAPARIN SODIUM 30 MG/0.3ML SC SOLN
30 MG/0.3ML | Freq: Every day | SUBCUTANEOUS | Status: DC
Start: 2015-04-28 — End: 2015-04-28
  Administered 2015-04-28: 13:00:00 30 mg via SUBCUTANEOUS

## 2015-04-28 MED FILL — HYDRALAZINE HCL 10 MG PO TABS: 10 MG | ORAL | Qty: 2

## 2015-04-28 MED FILL — DRAX IMAGE DTPA IV KIT: INTRAVENOUS | Qty: 40

## 2015-04-28 MED FILL — FLUTICASONE PROPIONATE 50 MCG/ACT NA SUSP: 50 MCG/ACT | NASAL | Qty: 16

## 2015-04-28 MED FILL — FUROSEMIDE 40 MG PO TABS: 40 MG | ORAL | Qty: 1

## 2015-04-28 MED FILL — TECHNETIUM TO 99M ALBUMIN AGGREGATED: Qty: 3

## 2015-04-28 MED FILL — LIPITOR 20 MG PO TABS: 20 MG | ORAL | Qty: 1

## 2015-04-28 MED FILL — ARIMIDEX 1 MG PO TABS: 1 MG | ORAL | Qty: 1

## 2015-04-28 MED FILL — CARVEDILOL 3.125 MG PO TABS: 3.125 MG | ORAL | Qty: 1

## 2015-04-28 MED FILL — ZYRTEC 10 MG PO TABS: 10 MG | ORAL | Qty: 1

## 2015-04-28 MED FILL — LOVENOX 30 MG/0.3ML SC SOLN: 30 MG/0.3ML | SUBCUTANEOUS | Qty: 0.3

## 2015-04-28 MED FILL — HUMALOG 100 UNIT/ML SC SOLN: 100 UNIT/ML | SUBCUTANEOUS | Qty: 3

## 2015-04-28 NOTE — Other (Signed)
Patient Acct Nbr:  [id number]  Primary AUTH/CERT:    [place] Name:   Holland Falling TRAD/OPEN [place]  Primary Insurance Plan Name:  [place]  Primary Insurance Group Number:  [id number]  Primary Insurance Plan Type: N  Primary Insurance Policy Number:  [id number]

## 2015-04-28 NOTE — ED Notes (Signed)
Patient denies any chest pain at this time.      Einar Gip, RN  04/28/15 [phone]

## 2015-04-28 NOTE — ED Notes (Signed)
Patient alert and oriented at this time. Family at bedside. Patient to VQ scan and back without difficulty. Report given to [facility]     Angelica Ran, RN  04/28/15 [phone]

## 2015-04-28 NOTE — H&P (Signed)
Hospital Medicine History & Physical      PCP: [name], MD    Date of Admission: 04/27/2015    Date of Service: Pt seen/examined on  04/28/2015  and  placed in Observation    Chief Complaint:  Chest Pain      History Of Present Illness:   Judy Velasquez is a 81 y.o. female with a history of hypertension, CAD, hyperlipidemia, diabetes, breast cancer, and chronic kidney disease. She presented with complaint of chest pain.This started shortly after she had lunch yesterday. She described the pain as tightness on the left side of her chest. She rated the pain as a 5 out of  Ten. She did not take anything to make it better. The pain lasted for about 3hrs. She denied diaphoresis, nausea, vomiting, shortness of breath and fever.    Past Medical History:    Patient  has a past medical history of Allergic rhinitis; Anemia; Anesthesia; Anxiety; Arthritis; Back problem; Blood transfusion; CAD (coronary artery disease); DCIS (ductal carcinoma in situ); Glaucoma; H/O cardiovascular stress test; Heart attack ([place]); HOH (hard of hearing); Hyperlipidemia; Hypertension; IBS (irritable bowel syndrome); Kidney problem; Nausea & vomiting; Osteoarthritis; Osteoporosis; Right Breast Cancer; Shortness of breath on exertion; Staph infection; TB (tuberculosis); and Type II or unspecified type diabetes mellitus without mention of complication, not stated as uncontrolled.    Past Surgical History:    Patient  has a past surgical history that includes Cesarean section ("Late 1950's or Early 1960's"); Carpal tunnel release (2000's); Breast surgery (7-12); joint replacement (1980's); joint replacement (2000's); joint replacement (1990's); joint replacement (1980's); other surgical history; Hysterectomy, total abdominal (1960's); Cardiac surgery (2008); other surgical history (2000's); Colonoscopy (2000's); Endoscopy, colon, diagnostic (1990's); Tonsillectomy and adenoidectomy (1939); Cholecystectomy, laparoscopic (1990's); Breast lumpectomy  (05/20/2011); and Coronary artery bypass graft (2008).    Medications Prior to Admission reviewed:    Prior to Admission medications    Medication Sig Start Date End Date Taking? Authorizing Provider   fluticasone (FLONASE) 50 MCG/ACT nasal spray 1 spray by Nasal route daily   Yes Historical Provider, MD   atorvastatin (LIPITOR) 20 MG tablet Take 20 mg by mouth daily   Yes Historical Provider, MD   pioglitazone (ACTOS) 15 MG tablet Take 15 mg by mouth daily   Yes Historical Provider, MD   anastrozole (ARIMIDEX) 1 MG tablet Take 1 tablet by mouth daily 04/21/14  Yes Maudie Mercury, MD   diltiazem [facility]) 240 MG SR capsule Take 240 mg by mouth every evening.     Yes Historical Provider, MD   Polysaccharide Iron Complex (FERREX 150 PO) Take  by mouth nightly.     Yes Historical Provider, MD   Naphazoline-Pheniramine (NAPHCON-A OP) Apply  to eye as needed. Over The Counter    Yes Historical Provider, MD   latanoprost (XALATAN) 0.005 % ophthalmic solution Place 1 drop into both eyes nightly.   Yes Historical Provider, MD   fexofenadine (ALLEGRA) 180 MG tablet Take 180 mg by mouth every morning.   Yes Historical Provider, MD   furosemide (LASIX) 40 MG tablet Take 40 mg by mouth 2 times daily.   Yes Historical Provider, MD   carvedilol (COREG) 6.25 MG tablet Take 3.125 mg by mouth 2 times daily    Yes Historical Provider, MD   hydrALAZINE (APRESOLINE) 10 MG tablet Take 20 mg by mouth 3 times daily. Two Tablets Twice Daily   Yes Historical Provider, MD     Current Facility-Administered Medications  Medication Dose Route Frequency Provider Last Rate Last Dose   . nitroGLYCERIN (NITROSTAT) SL tablet 0.4 mg  0.4 mg Sublingual Q5 Min PRN Johny Shears, MD         Current Outpatient Prescriptions   Medication Sig Dispense Refill   . fluticasone (FLONASE) 50 MCG/ACT nasal spray 1 spray by Nasal route daily     . atorvastatin (LIPITOR) 20 MG tablet Take 20 mg by mouth daily     . pioglitazone (ACTOS) 15 MG tablet Take  15 mg by mouth daily     . anastrozole (ARIMIDEX) 1 MG tablet Take 1 tablet by mouth daily 30 tablet 11   . diltiazem (TIAZAC) 240 MG SR capsule Take 240 mg by mouth every evening.       . Polysaccharide Iron Complex (FERREX 150 PO) Take  by mouth nightly.       Mable Fill (NAPHCON-A OP) Apply  to eye as needed. Over The Counter      . latanoprost (XALATAN) 0.005 % ophthalmic solution Place 1 drop into both eyes nightly.     . fexofenadine (ALLEGRA) 180 MG tablet Take 180 mg by mouth every morning.     . furosemide (LASIX) 40 MG tablet Take 40 mg by mouth 2 times daily.     . carvedilol (COREG) 6.25 MG tablet Take 3.125 mg by mouth 2 times daily      . hydrALAZINE (APRESOLINE) 10 MG tablet Take 20 mg by mouth 3 times daily. Two Tablets Twice Daily         Allergies:  Celebrex [celecoxib]; Lisinopril; Neurontin [gabapentin]; Norvasc [amlodipine besylate]; Other; Ciprofloxacin; and Lipitor    Social History:      TOBACCO:   reports that she has never smoked. She has never used smokeless tobacco.  ETOH:   reports that she does not drink alcohol.  Drugs:  reports that she does not use illicit drugs.    Family History:  Reviewed in detail and negative for DM, Early CAD, Cancer, CVA. Positive as follows:      Problem Relation Age of Onset   . Heart Disease Mother    . Cancer Mother      "Cancer In Her Eye"   . Depression Mother    . Mental Illness Mother    . Diabetes Father    . Heart Disease Father      "Heart Attack"   . Hearing Loss Father    . Cancer Sister      "Breast Cancer"   . Early Death Brother      "At [employer]   . Diabetes Sister    . Vision Loss Son      "Eye Problems"   . Other Son      "Alot of health problems due to motorcycle wreck"   . Arthritis Daughter    . Early Death Son      "Early 105's"       ROS:  At least ten systems reviewed and otherwise acutely negative except as in the HPI and below.    General: negative  Eyes: negative  RUE:[id number]  Cardiovascular: positive for - chest  pain  Respiratory: no cough, shortness of breath, or wheezing  Gastrointestinal: no abdominal pain, change in bowel habits, or black or bloody stools  Musculoskeletal: negative  Dermatological: negative  Neurological : negative  Genito-Urinary : negative  Endocrine : negative    Physical Exam:  Vitals:    04/28/15 0101   BP:  Pulse: (!) 46   Resp: 15   SpO2: 100%     Constitutional: She is oriented to person, place, and time. She appears well-developed and well-nourished.   HENT:   Head: Normocephalic and atraumatic.   Nose: Nose normal.   Mouth/Throat: Oropharynx is clear and moist.   Eyes: Conjunctivae and EOM are normal. Pupils are equal, round, and reactive to light.   Neck: Normal range of motion. Neck supple.   Cardiovascular: Chest pain  Normal rate, regular rhythm and intact distal pulses.   Pulmonary/Chest: Effort normal and breath sounds normal. No respiratory distress. She has no wheezes. She has no rales. She exhibits no tenderness.   Abdominal: Soft. Bowel sounds are normal. There is no tenderness. There is no rebound and no guarding.   Musculoskeletal: Normal range of motion. She exhibits no edema or tenderness.   Neurological: She is alert and oriented to person, place, and time.   Skin: Skin is warm and dry.   Psychiatric: She has a normal mood and affe    LABS This Visit reviewed:  Results for orders placed or performed during the hospital encounter of 04/27/15   CBC Auto Differential   Result Value Ref Range    WBC 6.4 4.0 - 10.5 K/CU MM    RBC 2.83 (L) 4.2 - 5.4 M/CU MM    Hemoglobin 9.4 (L) 12.5 - 16.0 GM/DL    Hematocrit 27.7 (L) 37 - 47 %    MCV 97.9 78 - 100 FL    MCH 33.3 (H) 27 - 31 PG    MCHC 34.0 32.0 - 36.0 %    RDW 12.3 11.7 - 14.9 %    Platelets 178 140 - 440 K/CU MM    MPV 8.0 7.5 - 11.1 FL    Differential Type AUTOMATED DIFFERENTIAL     Segs Relative 72.1 (H) 36 - 66 %    Lymphocytes Relative 14.8 (L) 24 - 44 %    Monocytes Relative 10.4 (H) 0 - 4 %    Eosinophils Relative  Percent 2.5 0 - 3 %    Basophils Relative 0.2 0 - 1 %    Segs Absolute 4.6 K/CU MM    Lymphocytes Absolute 0.9 K/CU MM    Monocytes Absolute 0.7 K/CU MM    Eosinophils Absolute 0.2 K/CU MM    Basophils Absolute 0.0 K/CU MM   Comprehensive Metabolic Panel   Result Value Ref Range    Sodium 137 135 - 145 MMOL/L    Potassium 4.0 3.5 - 5.1 MMOL/L    Chloride 99 99 - 110 mMol/L    CO2 19 (L) 21 - 32 MMOL/L    BUN 65 (H) 6 - 23 MG/DL    CREATININE 2.5 (H) 0.6 - 1.1 MG/DL    Glucose, Fasting 207 (H) 70 - 99 MG/DL    Calcium 9.0 8.3 - 10.6 MG/DL    Alb 4.1 3.4 - 5.0 GM/DL    Total Protein 6.4 6.4 - 8.2 GM/DL    Total Bilirubin 0.4 0.0 - 1.0 MG/DL    ALT 6 (L) 10 - 40 U/L    AST 16 15 - 37 IU/L    Alkaline Phosphatase 54 40 - 129 IU/L    GFR Non-African American 18 mL/min/1.58m2    GFR African American 22 mL/min/1.11m2    Anion Gap 19 (H) 4 - 16   D-Dimer, Quantitative   Result Value Ref Range    D-Dimer, Quant 551 (H) <230 NG/mL(DDU)  Troponin   Result Value Ref Range    Troponin T <0.010 <0.01 NG/ML   Brain Natriuretic Peptide   Result Value Ref Range    Pro-BNP 3316 (H) <300 PG/ML        Chart review shows recent radiographs:  Xr Chest Standard Two Vw    Result Date: 04/27/2015  EXAMINATION: TWO VIEWS OF THE CHEST  04/27/2015 9:40 pm  COMPARISON: 10/23/2009.  HISTORY: ORDERING SYSTEM PROVIDED HISTORY: Chest pain TECHNOLOGIST PROVIDED HISTORY: Ordering Physician Provided Reason for Exam: Chest pain, pt stated hx CHF, CABG Acuity: Acute Type of Exam: Initial Relevant Medical/Surgical History: Chest pain, pt stated hx CHF, CABG  FINDINGS: The cardiomediastinal silhouette is unremarkable.  Post sternotomy changes are noted.  Advanced atherosclerotic changes throughout the aorta.  The lungs are clear.  No infiltrate, pleural fluid or failure.  Post cholecystectomy clips are present.  No acute osseous findings.  Osteopenia and multilevel thoracic spondylosis.     No acute cardiopulmonary disease.     Nm Lung Ventilation Vq  Perfusion    Result Date: 04/28/2015  EXAMINATION: NUCLEAR MEDICINE VENTILATION PERFUSION SCAN. 04/28/2015  TECHNIQUE: 40 mCi aerosolized Tc64m DTPA was administered via mask prior to planar imaging of the lungs in multiple projections.  Then, 3 mCi of Tc18m MAA was administered intravenously prior to planar imaging of the lungs in similar projections.  COMPARISON: Chest radiograph 04/27/2015  HISTORY: ORDERING SYSTEM PROVIDED HISTORY: Chest Pain, r/o PE TECHNOLOGIST PROVIDED HISTORY: Ordering Physician Provided Reason for Exam: left anterior chest pain Acuity: Acute Type of Exam: Initial Additional signs and symptoms: congestion, post nasal drip  FINDINGS: PERFUSION:  A few small subpleural perfusion defects in both lungs, most prominent in the superior segments of the lower lobes.  VENTILATION:  Ventilation defects matching those on the perfusion images. Clumping of inhaled activity in the central airways.  CHEST RADIOGRAPH:  Clear lungs.  No definite findings of pleural effusion or pneumothorax.  Normal mediastinal, hilar, and cardiac contours.     1. Low probability for pulmonary embolism. 2. Clumping of inhaled activity in the central airways, potentially related to airways disease or inspiratory effort.     Mam Tomo Digital Screen Bilateral    Result Date: 04/13/2015  EXAMINATION: SCREENING DIGITAL BILATERAL  MAMMOGRAM WITH TOMOSYNTHESIS 04/12/2015  TECHNIQUE: Screening mammography of the bilateral breasts was performed with tomosynthesis.  2D standard and 3D tomosynthesis combination imaging performed through both breasts in the MLO and CC projection.  Computer aided detection was utilized in the interpretation of this exam.  COMPARISON: April 06, 2014 and April 05, 2013  HISTORY: Screening.  FINDINGS: The breasts are heterogeneously dense, which may obscure small masses. Within that limitation, there is no dominant mass, suspicious microcalcification, or area of architectural distortion.     No mammographic evidence  of malignancy.  BIRADS: BIRADS - CATEGORY 1  Negative, no evidence of malignancy.  Normal interval follow-up is recommended in 12 months.  OVERALL ASSESSMENT - NEGATIVE  A letter of notification will be sent to the patient regarding the results.  The [employer] recommends annual mammograms for women 40 years and older.       EKG  Reviewed in the absence of a cardiologist   Sinus rhythm with first-degree AV block at 67 BPM. Questionable hyperacute anterior T waves. No ST elevations. No T-wave inversions. + LVH. Q waves in V1 and V2 suspicious for a septal infarct. Interpretation: Abnormal EKG. No significant acute ischemic changes are seen when compared  with the prior EKG from 08/13/11.    Clinical Impression and plan :    1. Chest pain   Oxygen supplimentation   Aspirin  Pain control  Telemetry   12 lead EKG   cardiac enzymes monitor   Lipid profile      2.  Diabetes mellitus type II   POCT glucose  Sliding scale insulin with meal cocerage   Hypoglycemic protocol.    Aurea Aronov NJoku, CNP

## 2015-04-28 NOTE — ED Notes (Signed)
Patient noted to be bradycardic on monitor 44-45. Patient alert and oriented at this time. Denies being dizzy or lightheaded. Updated ER Doctor of new findings. No new orders received. Care from this nurse ended at this time. Care assumed by [facility]     Angelica Ran, RN  04/28/15 [phone]

## 2015-04-28 NOTE — ED Notes (Signed)
Patient to VQ scan with tech. No respiratory distress noted.      Angelica Ran, RN  04/28/15 [phone]

## 2015-04-28 NOTE — Discharge Summary (Addendum)
Hospital Discharge Summary    Patient's PCP: [name], MD  Admit Date: 04/28/2015   Discharge Date: 04/28/2015    Admitting Physician: Dr. Fatima Sanger, MD  Discharge Physician: Dr. Fatima Sanger   Consults: none    Brief HPI: Judy Velasquez is a 81 y.o. female with a history of hypertension, CAD, hyperlipidemia, diabetes, breast cancer, and chronic kidney disease. She presented with complaint of chest pain.This started shortly after she had lunch yesterday. She described the pain as tightness on the left side of her chest. She rated the pain as a 5 out of Ten. She did not take anything to make it better. The pain lasted for about 3hrs. She denied diaphoresis, nausea, vomiting, shortness of breath and fever.  ??    Brief hospital course:   1 chest pain trop negative, hyperacute T wave only in anterior lead V2 and V3, , pt had  A stress test last year per her it was negative, it was done in the cardiologist office. Given that I will Dc patient to home aske for follow up with cardiology next week after Dc to see if he she nned another stress test, pt just ate a burger prior to the event.     Follow up BP trend was slightly increased in the hospital    Invasive procedures:  NA    Discharge Diagnoses:   Principal Problem:    Chest pain      Physical Exam:   Visit Vitals   ??? BP 150/62   ??? Pulse 50   ??? Temp 97.7 ??F (36.5 ??C) (Temporal)   ??? Resp 18   ??? Ht 5' (1.524 m)   ??? Wt 97 lb 11.2 oz (44.3 kg)   ??? SpO2 97%   ??? BMI 19.08 kg/m2     Gen/overall appearance: Not in acute distress. Alert.  VQQ:VZDG S2, ESM.  Pulm: Clear bilaterally. No crackles/wheezes  Gastrointestinal: Soft, NT/ND, +BS  Musculoskeletal: No edema. Warm  Neuro: No focal deficit. Moves extremity spontaneously.  Psychiatry: Appropriate affect. Not agitated.  Skin: Warm, dry with normal turgor. No rash        Significant Diagnostic Studies:    NM Lung Ventilation Vq Perfusion [[id number]] Collected: 04/28/15 0109    ?? Updated: 04/28/15 0116     ?? Narrative: ??   ?? EXAMINATION:  NUCLEAR MEDICINE VENTILATION PERFUSION SCAN. 04/28/2015    TECHNIQUE:  40 mCi aerosolized Tc78m DTPA was administered via mask prior to planar  imaging of the lungs in multiple projections. ??Then, 3 mCi of Tc2m MAA was  administered intravenously prior to planar imaging of the lungs in similar  projections.    COMPARISON:  Chest radiograph 04/27/2015    HISTORY:  ORDERING SYSTEM PROVIDED HISTORY: Chest Pain, r/o PE  TECHNOLOGIST PROVIDED HISTORY:  Ordering Physician Provided Reason for Exam: left anterior chest pain  Acuity: Acute  Type of Exam: Initial  Additional signs and symptoms: congestion, post nasal drip    FINDINGS:  PERFUSION: ??A few small subpleural perfusion defects in both lungs, most  prominent in the superior segments of the lower lobes.    VENTILATION: ??Ventilation defects matching those on the perfusion images.  Clumping of inhaled activity in the central airways.    CHEST RADIOGRAPH: ??Clear lungs. ??No definite findings of pleural effusion or  pneumothorax. ??Normal mediastinal, hilar, and cardiac contours.     ?? Impression: ??   ?? 1. Low probability for pulmonary embolism.  2. Clumping of inhaled  activity in the central airways, potentially related  to airways disease or inspiratory effort     CXR  Impression   No acute cardiopulmonary disease.   ??         Treatments: As above.      Discharge Medications:     Medication List      CONTINUE taking these medications          anastrozole 1 MG tablet   Commonly known as:  ARIMIDEX   Take 1 tablet by mouth daily       atorvastatin 20 MG tablet   Commonly known as:  LIPITOR       carvedilol 6.25 MG tablet   Commonly known as:  COREG       diltiazem 240 MG SR capsule   Commonly known as:  TIAZAC       FERREX 150 PO       fexofenadine 180 MG tablet   Commonly known as:  ALLEGRA       fluticasone 50 MCG/ACT nasal spray   Commonly known as:  FLONASE       furosemide 40 MG tablet   Commonly known as:  LASIX       hydrALAZINE 10  MG tablet   Commonly known as:  APRESOLINE       latanoprost 0.005 % ophthalmic solution   Commonly known as:  XALATAN       NAPHCON-A OP       pioglitazone 15 MG tablet   Commonly known as:  ACTOS         STOP taking these medications          alendronate 70 MG tablet   Commonly known as:  FOSAMAX       losartan 25 MG tablet   Commonly known as:  COZAAR             Activity: activity as tolerated  Diet: DIET CARDIAC;      Disposition: home  Discharged Condition: Stable  Follow Up:   [name], MD  [address]  [address]  [place] [address]  [phone]          Scot Jun, MD  [address], [address]  [place] [address]  [phone]    In 5 days  after hospital admission for chest pain        Code status:  Full Code       There is no immunization history on file for this patient.  Pt got pneumonia vaccine last summer  Advance Directive status:  Not Received  Pt doesn't want a living well for health, she said her daughter can make decision for her if she can't    Total time spent on discharge, finalizing medications, referrals and arranging outpatient follow up was more than 45 minutes      Thank you Dr. [name], MD for the opportunity to be involved in this patients care.

## 2015-04-28 NOTE — Plan of Care (Signed)
Problem: Safety:  Goal: Free from accidental physical injury  Free from accidental physical injury   Outcome: Ongoing  Goal: Free from intentional harm  Free from intentional harm   Outcome: Ongoing    Problem: Pain:  Goal: Patient???s pain/discomfort is manageable  Patient???s pain/discomfort is manageable   Outcome: Ongoing    Problem: Discharge Planning:  Goal: Patients continuum of care needs are met  Patients continuum of care needs are met   Outcome: Ongoing

## 2015-05-01 LAB — EKG 12-LEAD
Atrial Rate: 67 {beats}/min
P Axis: 51 degrees
P-R Interval: 224 ms
Q-T Interval: 422 ms
QRS Duration: 102 ms
QTc Calculation (Bazett): 445 ms
R Axis: -26 degrees
T Axis: 61 degrees
Ventricular Rate: 67 {beats}/min

## 2015-05-05 NOTE — Progress Notes (Signed)
Discharge follow up call made.  Patient verbalizes understanding of all discharge instructions.  Was able to get prescriptions filled and denies worsening symptoms. PT had follow up appointment with her Cardiologist. Phone number for discharge call office given for further needs.

## 2015-07-19 LAB — BASIC METABOLIC PANEL
Anion Gap: 14 (ref 4–16)
BUN: 47 MG/DL — ABNORMAL HIGH (ref 6–23)
CO2: 27 MMOL/L (ref 21–32)
Calcium: 9.3 MG/DL (ref 8.3–10.6)
Chloride: 98 mMol/L — ABNORMAL LOW (ref 99–110)
Creatinine: 2.1 MG/DL — ABNORMAL HIGH (ref 0.6–1.1)
GFR African American: 27 mL/min/{1.73_m2}
GFR Non-African American: 22 mL/min/{1.73_m2}
Glucose: 174 MG/DL — ABNORMAL HIGH (ref 70–140)
Potassium: 3.3 MMOL/L — ABNORMAL LOW (ref 3.5–5.1)
Sodium: 139 MMOL/L (ref 135–145)

## 2015-08-22 ENCOUNTER — Telehealth

## 2015-08-23 NOTE — Telephone Encounter (Signed)
Sent to [facility]

## 2016-02-20 ENCOUNTER — Inpatient Hospital Stay: Payer: Medicare HMO | Attending: Oncology | Admitting: Oncology

## 2016-02-20 ENCOUNTER — Inpatient Hospital Stay: Payer: Medicare HMO

## 2016-02-20 VITALS — BP 197/57 | HR 63 | Temp 97.2°F | Resp 16 | Wt 93.7 lb

## 2016-02-20 DIAGNOSIS — I252 Old myocardial infarction: Secondary | ICD-10-CM | POA: Insufficient documentation

## 2016-02-20 DIAGNOSIS — E1122 Type 2 diabetes mellitus with diabetic chronic kidney disease: Secondary | ICD-10-CM | POA: Insufficient documentation

## 2016-02-20 DIAGNOSIS — M1990 Unspecified osteoarthritis, unspecified site: Secondary | ICD-10-CM | POA: Diagnosis not present

## 2016-02-20 DIAGNOSIS — I130 Hypertensive heart and chronic kidney disease with heart failure and stage 1 through stage 4 chronic kidney disease, or unspecified chronic kidney disease: Secondary | ICD-10-CM | POA: Diagnosis not present

## 2016-02-20 DIAGNOSIS — N189 Chronic kidney disease, unspecified: Secondary | ICD-10-CM

## 2016-02-20 DIAGNOSIS — D638 Anemia in other chronic diseases classified elsewhere: Secondary | ICD-10-CM

## 2016-02-20 DIAGNOSIS — I129 Hypertensive chronic kidney disease with stage 1 through stage 4 chronic kidney disease, or unspecified chronic kidney disease: Secondary | ICD-10-CM | POA: Diagnosis present

## 2016-02-20 DIAGNOSIS — Z951 Presence of aortocoronary bypass graft: Secondary | ICD-10-CM

## 2016-02-20 DIAGNOSIS — I509 Heart failure, unspecified: Secondary | ICD-10-CM | POA: Diagnosis not present

## 2016-02-20 DIAGNOSIS — E785 Hyperlipidemia, unspecified: Secondary | ICD-10-CM

## 2016-02-20 DIAGNOSIS — D631 Anemia in chronic kidney disease: Secondary | ICD-10-CM

## 2016-02-20 DIAGNOSIS — D751 Secondary polycythemia: Secondary | ICD-10-CM

## 2016-02-20 DIAGNOSIS — Z79899 Other long term (current) drug therapy: Secondary | ICD-10-CM

## 2016-02-20 DIAGNOSIS — D509 Iron deficiency anemia, unspecified: Secondary | ICD-10-CM

## 2016-02-20 LAB — IRON AND TIBC
Iron: 71 ug/dL (ref 28–170)
Saturation Ratios: 29 % (ref 10.4–31.8)
TIBC: 247 ug/dL — AB (ref 250–450)
UIBC: 176 ug/dL

## 2016-02-20 LAB — CBC
HEMATOCRIT: 22.1 % — AB (ref 35.0–47.0)
HEMOGLOBIN: 7.8 g/dL — AB (ref 12.0–16.0)
MCH: 34.5 pg — ABNORMAL HIGH (ref 26.0–34.0)
MCHC: 35.3 g/dL (ref 32.0–36.0)
MCV: 97.6 fL (ref 80.0–100.0)
Platelets: 174 10*3/uL (ref 150–440)
RBC: 2.26 MIL/uL — AB (ref 3.80–5.20)
RDW: 13.3 % (ref 11.5–14.5)
WBC: 5.1 10*3/uL (ref 3.6–11.0)

## 2016-02-20 LAB — DAT, POLYSPECIFIC AHG (ARMC ONLY): Polyspecific AHG test: NEGATIVE

## 2016-02-20 LAB — VITAMIN B12: VITAMIN B 12: 933 pg/mL — AB (ref 180–914)

## 2016-02-20 LAB — RETICULOCYTES
RBC.: 2.26 MIL/uL — AB (ref 3.80–5.20)
RETIC COUNT ABSOLUTE: 40.7 10*3/uL (ref 19.0–183.0)
Retic Ct Pct: 1.8 % (ref 0.4–3.1)

## 2016-02-20 LAB — FERRITIN: FERRITIN: 161 ng/mL (ref 11–307)

## 2016-02-20 LAB — FOLATE: FOLATE: 81.7 ng/mL (ref >5.9)

## 2016-02-20 LAB — LACTATE DEHYDROGENASE: LDH: 158 U/L (ref 98–192)

## 2016-02-20 NOTE — Progress Notes (Signed)
Patient does not have history of anemia but her hemoglobin has been declining on recent checks.

## 2016-02-22 LAB — HAPTOGLOBIN: HAPTOGLOBIN: 67 mg/dL (ref 34–200)

## 2016-02-22 LAB — ERYTHROPOIETIN: Erythropoietin: 14.1 m[IU]/mL (ref 2.6–18.5)

## 2016-03-02 DIAGNOSIS — N189 Chronic kidney disease, unspecified: Secondary | ICD-10-CM

## 2016-03-02 DIAGNOSIS — D631 Anemia in chronic kidney disease: Secondary | ICD-10-CM | POA: Insufficient documentation

## 2016-03-02 DIAGNOSIS — D509 Iron deficiency anemia, unspecified: Secondary | ICD-10-CM | POA: Insufficient documentation

## 2016-03-02 NOTE — Progress Notes (Signed)
[place]  Telephone:(336) [phone] Fax:(336) [phone]  ID: Imaan Padgett OB: 1929-03-02  MR#: [id number]  FIE#:[id number]  Patient Care Team: Dion Body, MD as PCP - General (Family Medicine)  CHIEF COMPLAINT:  Chief Complaint  Patient presents with  . New Evaluation    anemia    INTERVAL HISTORY: Patient is an 82 year old female who was found to have a declining hemoglobin on routine blood work. She currently feels well and is asymptomatic. She she does not complain of weakness or fatigue. She has no neurologic complaints. She denies any recent fevers or illnesses. She denies any chest pain or shortness of breath. She has no nausea, vomiting, constipation, or diarrhea. She has no melena or hematochezia. She has no urinary complaints. Patient feels at her baseline and offers no specific complaints today.  REVIEW OF SYSTEMS:   Review of Systems  Constitutional: Negative.  Negative for fever, weight loss and malaise/fatigue.  Respiratory: Negative.  Negative for cough, hemoptysis and shortness of breath.   Cardiovascular: Negative.  Negative for chest pain.  Gastrointestinal: Negative.  Negative for blood in stool and melena.  Genitourinary: Negative.  Negative for hematuria.  Musculoskeletal: Negative.   Neurological: Negative.  Negative for weakness.  Psychiatric/Behavioral: Negative.     As per HPI. Otherwise, a complete review of systems is negatve.  PAST MEDICAL HISTORY: Past Medical History  Diagnosis Date  . Hypertension   . Shortness of breath   . Pneumonia   . Diabetes mellitus without complication   . CHF (congestive heart failure)   . Myocardial infarction ([facility]) 1996  . Chronic kidney disease   . Headache(784.0)     sinus  . Cancer 2012    Rt. Breast non invasive  . Arthritis     osteoarthritis  . Glaucoma   . Hyperlipidemia     PAST SURGICAL HISTORY: Past Surgical History  Procedure Laterality Date  . Coronary artery bypass  graft      triple   . Cesarean section    . C section    . Abdominal hysterectomy    . Joint replacement      hip bilateral  . Cesarean section    . Total hip arthroplasty    . Right total knee    . Cholecystectomy    . Eus N/A 12/17/2012    Procedure: UPPER ENDOSCOPIC ULTRASOUND (EUS) LINEAR;  Surgeon: Milus Banister, MD;  Location: [facility];  Service: Endoscopy;  Laterality: N/A;  . Eus N/A 01/06/2014    Procedure: UPPER ENDOSCOPIC ULTRASOUND (EUS) LINEAR;  Surgeon: Milus Banister, MD;  Location: [facility];  Service: Endoscopy;  Laterality: N/A;    FAMILY HISTORY No family history on file.     ADVANCED DIRECTIVES:    HEALTH MAINTENANCE: Social History  Substance Use Topics  . Smoking status: Never Smoker   . Smokeless tobacco: Never Used  . Alcohol Use: No     Colonoscopy:  PAP:  Bone density:  Lipid panel:  Allergies  Allergen Reactions  . Celebrex [Celecoxib] Anaphylaxis  . Fish Allergy Anaphylaxis  . Shellfish Allergy Anaphylaxis    Current Outpatient Prescriptions  Medication Sig Dispense Refill  . acetaminophen (TYLENOL) 500 MG tablet Take 500 mg by mouth every 6 (six) hours as needed for pain.    Marland Kitchen anastrozole (ARIMIDEX) 1 MG tablet Take 1 mg by mouth daily.    Marland Kitchen b complex vitamins capsule Take 1 capsule by mouth daily.    . carvedilol (COREG) 12.5 MG  tablet Take 6.25 mg by mouth 2 (two) times daily with a meal.    . diltiazem (DILACOR XR) 180 MG 24 hr capsule Take 180 mg by mouth daily after lunch.    . fexofenadine (ALLEGRA) 180 MG tablet Take 180 mg by mouth daily.    . furosemide (LASIX) 40 MG tablet Take 40 mg by mouth daily.     . hydrALAZINE (APRESOLINE) 50 MG tablet Take 50 mg by mouth 3 (three) times daily.    . iron polysaccharides (NIFEREX) 150 MG capsule Take 150 mg by mouth daily.    Marland Kitchen latanoprost (XALATAN) 0.005 % ophthalmic solution Place 1 drop into both eyes at bedtime.    Marland Kitchen losartan (COZAAR) 25 MG tablet Take 25 mg by mouth at  bedtime.    . naphazoline-pheniramine (NAPHCON-A) 0.025-0.3 % ophthalmic solution Place 1 drop into both eyes 4 (four) times daily as needed for irritation.    Marland Kitchen alendronate (FOSAMAX) 70 MG tablet Take 70 mg by mouth every Sunday. Reported on 02/20/2016    . ciprofloxacin (CIPRO) 500 MG tablet Take 1 tablet (500 mg total) by mouth 2 (two) times daily. 6 tablet 0  . glipiZIDE (GLUCOTROL) 10 MG tablet Take 10 mg by mouth daily as needed (If blood sugar gets around 200 will take one). Reported on 02/20/2016     No current facility-administered medications for this visit.    OBJECTIVE: Filed Vitals:   02/20/16 1520  BP: 197/57  Pulse: 63  Temp: 97.2 F (36.2 C)  Resp: 16     Body mass index is 18.3 kg/(m^2).    ECOG FS:0 - Asymptomatic  General: Well-developed, well-nourished, no acute distress. Eyes: Pink conjunctiva, anicteric sclera. HEENT: Normocephalic, moist mucous membranes, clear oropharnyx. Lungs: Clear to auscultation bilaterally. Heart: Regular rate and rhythm. No rubs, murmurs, or gallops. Abdomen: Soft, nontender, nondistended. No organomegaly noted, normoactive bowel sounds. Musculoskeletal: No edema, cyanosis, or clubbing. Neuro: Alert, answering all questions appropriately. Cranial nerves grossly intact. Skin: No rashes or petechiae noted. Psych: Normal affect. Lymphatics: No cervical, calvicular, axillary or inguinal LAD.   LAB RESULTS:  Lab Results  Component Value Date   NA 139 11/19/2013   K 3.8 11/19/2013   CL 106 11/19/2013   CO2 26 11/19/2013   GLUCOSE 77 11/19/2013   BUN 28* 11/19/2013   CREATININE 1.69* 11/19/2013   CALCIUM 8.4* 11/19/2013   PROT 6.9 11/16/2013   ALBUMIN 3.5 11/16/2013   AST 15 11/16/2013   ALT 9* 11/16/2013   ALKPHOS 58 11/16/2013   BILITOT 0.7 11/16/2013   GFRNONAA 28* 11/19/2013   GFRAA 32* 11/19/2013    Lab Results  Component Value Date   WBC 5.1 02/20/2016   NEUTROABS 5.3 11/17/2013   HGB 7.8* 02/20/2016   HCT 22.1*  02/20/2016   MCV 97.6 02/20/2016   PLT 174 02/20/2016   Lab Results  Component Value Date   IRON 71 02/20/2016   TIBC 247* 02/20/2016   IRONPCTSAT 29 02/20/2016    Lab Results  Component Value Date   FERRITIN 161 02/20/2016     STUDIES: No results found.  ASSESSMENT: Anemia secondary to chronic kidney disease.  PLAN:    1. Anemia: Likely secondary to patient's mild chronic renal insufficiency. Iron stores, B12, folate, and hemolysis labs are all either negative or within normal limits. Patient's erythropoietin level as well as her reticulocyte count are inappropriately normal. She may have a component of MDS, but this would take a bone marrow biopsy to diagnose  which is not necessary in an 82 year old female. She may benefit from Procrit. Return to clinic in 2-3 weeks for further evaluation, discussion of her laboratory work, and initiation of Procrit if desired.  Patient expressed understanding and was in agreement with this plan. She also understands that She can call clinic at any time with any questions, concerns, or complaints.    Lloyd Huger, MD   03/02/2016 7:25 AM

## 2016-03-04 ENCOUNTER — Ambulatory Visit: Payer: Medicare HMO

## 2016-03-05 ENCOUNTER — Inpatient Hospital Stay: Payer: Medicare HMO | Attending: Oncology | Admitting: Oncology

## 2016-03-05 ENCOUNTER — Inpatient Hospital Stay: Payer: Medicare HMO

## 2016-03-05 ENCOUNTER — Ambulatory Visit: Payer: Medicare HMO | Admitting: Oncology

## 2016-03-05 VITALS — BP 146/63 | HR 60 | Temp 98.1°F | Wt 92.8 lb

## 2016-03-05 DIAGNOSIS — N189 Chronic kidney disease, unspecified: Secondary | ICD-10-CM

## 2016-03-05 DIAGNOSIS — N289 Disorder of kidney and ureter, unspecified: Secondary | ICD-10-CM | POA: Insufficient documentation

## 2016-03-05 DIAGNOSIS — M1990 Unspecified osteoarthritis, unspecified site: Secondary | ICD-10-CM | POA: Diagnosis not present

## 2016-03-05 DIAGNOSIS — Z79899 Other long term (current) drug therapy: Secondary | ICD-10-CM

## 2016-03-05 DIAGNOSIS — E1122 Type 2 diabetes mellitus with diabetic chronic kidney disease: Secondary | ICD-10-CM | POA: Insufficient documentation

## 2016-03-05 DIAGNOSIS — I2510 Atherosclerotic heart disease of native coronary artery without angina pectoris: Secondary | ICD-10-CM | POA: Insufficient documentation

## 2016-03-05 DIAGNOSIS — D509 Iron deficiency anemia, unspecified: Secondary | ICD-10-CM

## 2016-03-05 DIAGNOSIS — Z951 Presence of aortocoronary bypass graft: Secondary | ICD-10-CM | POA: Insufficient documentation

## 2016-03-05 DIAGNOSIS — I129 Hypertensive chronic kidney disease with stage 1 through stage 4 chronic kidney disease, or unspecified chronic kidney disease: Secondary | ICD-10-CM | POA: Diagnosis not present

## 2016-03-05 DIAGNOSIS — E785 Hyperlipidemia, unspecified: Secondary | ICD-10-CM

## 2016-03-05 DIAGNOSIS — R0681 Apnea, not elsewhere classified: Secondary | ICD-10-CM | POA: Insufficient documentation

## 2016-03-05 DIAGNOSIS — D631 Anemia in chronic kidney disease: Secondary | ICD-10-CM

## 2016-03-05 MED ORDER — SODIUM CHLORIDE 0.9 % IV SOLN
Freq: Once | INTRAVENOUS | Status: AC
  Administered 2016-03-05: 15:00:00 via INTRAVENOUS
  Filled 2016-03-05: qty 1000

## 2016-03-05 MED ORDER — SODIUM CHLORIDE 0.9 % IV SOLN
510.0000 mg | Freq: Once | INTRAVENOUS | Status: AC
  Administered 2016-03-05: 510 mg via INTRAVENOUS
  Filled 2016-03-05: qty 17

## 2016-03-11 NOTE — Progress Notes (Signed)
[place]  [phone]) [phone] Fax:(336) [phone]  ID: Rashmi Tallent OB: [birth year]  MR#: [id number]  DXI#:[id number]  Patient Care Team: Dion Body, MD as PCP - General (Family Medicine)  CHIEF COMPLAINT:  Chief Complaint  Patient presents with  . Anemia in chronic renal disease    INTERVAL HISTORY: Patient returns to clinic today for further evaluation and treatment planning. She continues to feel well and is asymptomatic. She she does not complain of weakness or fatigue. She has no neurologic complaints. She denies any recent fevers or illnesses. She denies any chest pain or shortness of breath. She has no nausea, vomiting, constipation, or diarrhea. She has no melena or hematochezia. She has no urinary complaints. Patient feels at her baseline and offers no specific complaints today.  REVIEW OF SYSTEMS:   Review of Systems  Constitutional: Negative.  Negative for fever, weight loss and malaise/fatigue.  Respiratory: Negative.  Negative for cough, hemoptysis and shortness of breath.   Cardiovascular: Negative.  Negative for chest pain.  Gastrointestinal: Negative.  Negative for blood in stool and melena.  Genitourinary: Negative.  Negative for hematuria.  Musculoskeletal: Negative.   Neurological: Negative.  Negative for weakness.  Psychiatric/Behavioral: Negative.     As per HPI. Otherwise, a complete review of systems is negatve.  PAST MEDICAL HISTORY: Past Medical History  Diagnosis Date  . Hypertension   . Shortness of breath   . Pneumonia   . Diabetes mellitus without complication   . CHF (congestive heart failure)   . Myocardial infarction ([place]) 1996  . Chronic kidney disease   . Headache(784.0)     sinus  . Cancer 2012    Rt. Breast non invasive  . Arthritis     osteoarthritis  . Glaucoma   . Hyperlipidemia     PAST SURGICAL HISTORY: Past Surgical History  Procedure Laterality Date  . Coronary artery bypass graft     triple   . Cesarean section    . C section    . Abdominal hysterectomy    . Joint replacement      hip bilateral  . Cesarean section    . Total hip arthroplasty    . Right total knee    . Cholecystectomy    . Eus N/A 12/17/2012    Procedure: UPPER ENDOSCOPIC ULTRASOUND (EUS) LINEAR;  Surgeon: Milus Banister, MD;  [facility];  Service: Endoscopy;  Laterality: N/A;  . Eus N/A 01/06/2014    Procedure: UPPER ENDOSCOPIC ULTRASOUND (EUS) LINEAR;  Surgeon: Milus Banister, MD;  [facility];  Service: Endoscopy;  Laterality: N/A;    FAMILY HISTORY No family history on file.     ADVANCED DIRECTIVES:    HEALTH MAINTENANCE: Social History  Substance Use Topics  . Smoking status: Never Smoker   . Smokeless tobacco: Never Used  . Alcohol Use: No     Colonoscopy:  PAP:  Bone density:  Lipid panel:  Allergies  Allergen Reactions  . Celebrex [Celecoxib] Anaphylaxis  . Fish Allergy Anaphylaxis  . Shellfish Allergy Anaphylaxis    Current Outpatient Prescriptions  Medication Sig Dispense Refill  . acetaminophen (TYLENOL) 500 MG tablet Take 500 mg by mouth every 6 (six) hours as needed for pain.    Marland Kitchen alendronate (FOSAMAX) 70 MG tablet Take 70 mg by mouth every Sunday. Reported on 02/20/2016    . anastrozole (ARIMIDEX) 1 MG tablet Take by mouth.    Marland Kitchen atorvastatin (LIPITOR) 20 MG tablet Take by mouth.    Marland Kitchen  b complex vitamins capsule Take 1 capsule by mouth daily.    . carvedilol (COREG) 6.25 MG tablet Take by mouth.    . diltiazem (TIAZAC) 240 MG 24 hr capsule Take by mouth.    . fexofenadine (ALLEGRA) 180 MG tablet Take by mouth.    . fluticasone (FLONASE) 50 MCG/ACT nasal spray Place into the nose.    . furosemide (LASIX) 40 MG tablet Take by mouth.    Marland Kitchen glipiZIDE (GLUCOTROL) 10 MG tablet Take 10 mg by mouth daily as needed (If blood sugar gets around 200 will take one). Reported on 02/20/2016    . hydrALAZINE (APRESOLINE) 10 MG tablet Take by mouth.    . iron  polysaccharides (NIFEREX) 150 MG capsule Take by mouth.    . latanoprost (XALATAN) 0.005 % ophthalmic solution     . LITE TOUCH LANCETS MISC     . losartan (COZAAR) 25 MG tablet TAKE 1 TABLET (25 MG TOTAL) BY MOUTH ONCE DAILY.  1  . naphazoline-pheniramine (NAPHCON-A) 0.025-0.3 % ophthalmic solution Apply to eye.    . pioglitazone (ACTOS) 15 MG tablet Take by mouth.     No current facility-administered medications for this visit.    OBJECTIVE: Filed Vitals:   03/05/16 1440  BP: 146/63  Pulse: 60  Temp: 98.1 F (36.7 C)     Body mass index is 18.13 kg/(m^2).    ECOG FS:0 - Asymptomatic  General: Well-developed, well-nourished, no acute distress. Eyes: Pink conjunctiva, anicteric sclera. Lungs: Clear to auscultation bilaterally. Heart: Regular rate and rhythm. No rubs, murmurs, or gallops. Abdomen: Soft, nontender, nondistended. No organomegaly noted, normoactive bowel sounds. Musculoskeletal: No edema, cyanosis, or clubbing. Neuro: Alert, answering all questions appropriately. Cranial nerves grossly intact. Skin: No rashes or petechiae noted. Psych: Normal affect.   LAB RESULTS:  Lab Results  Component Value Date   NA 139 11/19/2013   K 3.8 11/19/2013   CL 106 11/19/2013   CO2 26 11/19/2013   GLUCOSE 77 11/19/2013   BUN 28* 11/19/2013   CREATININE 1.69* 11/19/2013   CALCIUM 8.4* 11/19/2013   PROT 6.9 11/16/2013   ALBUMIN 3.5 11/16/2013   AST 15 11/16/2013   ALT 9* 11/16/2013   ALKPHOS 58 11/16/2013   BILITOT 0.7 11/16/2013   GFRNONAA 28* 11/19/2013   GFRAA 32* 11/19/2013    Lab Results  Component Value Date   WBC 5.1 02/20/2016   NEUTROABS 5.3 11/17/2013   HGB 7.8* 02/20/2016   HCT 22.1* 02/20/2016   MCV 97.6 02/20/2016   PLT 174 02/20/2016   Lab Results  Component Value Date   IRON 71 02/20/2016   TIBC 247* 02/20/2016   IRONPCTSAT 29 02/20/2016    Lab Results  Component Value Date   FERRITIN 161 02/20/2016     STUDIES: No results  found.  ASSESSMENT: Anemia secondary to chronic kidney disease.  PLAN:    1. Anemia: Likely secondary to patient's mild chronic renal insufficiency. Iron stores, B12, folate, and hemolysis labs are all either negative or within normal limits. Patient's erythropoietin level as well as her reticulocyte count are inappropriately normal. She may have a component of MDS, but this would take a bone marrow biopsy to diagnose which is not necessary in an 82 year old female. She may benefit from Procrit. Patient is going to [place] for several months, therefore return to clinic in 6 months with repeat laboratory work and further evaluation.   Patient expressed understanding and was in agreement with this plan. She also understands that She  can call clinic at any time with any questions, concerns, or complaints.    Lloyd Huger, MD   03/11/2016 11:56 PM

## 2016-04-01 LAB — RENAL FUNCTION PANEL
Albumin: 3.9 GM/DL (ref 3.4–5.0)
Anion Gap: 11 (ref 4–16)
BUN: 66 MG/DL — ABNORMAL HIGH (ref 6–23)
CO2: 26 MMOL/L (ref 21–32)
Calcium: 9.2 MG/DL (ref 8.3–10.6)
Chloride: 100 mMol/L (ref 99–110)
Creatinine: 3.1 MG/DL — ABNORMAL HIGH (ref 0.6–1.1)
GFR African American: 17 mL/min/{1.73_m2} — ABNORMAL LOW (ref >60)
GFR Non-African American: 14 mL/min/{1.73_m2} — ABNORMAL LOW (ref >60)
Glucose: 98 MG/DL (ref 70–140)
Phosphorus: 4.6 MG/DL (ref 2.5–4.9)
Potassium: 4.9 MMOL/L (ref 3.5–5.1)
Sodium: 137 MMOL/L (ref 135–145)

## 2016-04-16 ENCOUNTER — Encounter: Primary: Internal Medicine

## 2016-04-17 ENCOUNTER — Inpatient Hospital Stay: Admit: 2016-04-17 | Attending: Surgery | Primary: Internal Medicine

## 2016-04-17 DIAGNOSIS — Z1231 Encounter for screening mammogram for malignant neoplasm of breast: Secondary | ICD-10-CM

## 2016-04-25 ENCOUNTER — Ambulatory Visit: Admit: 2016-04-25 | Discharge: 2016-04-25 | Payer: MEDICARE | Attending: Surgery | Primary: Internal Medicine

## 2016-04-25 DIAGNOSIS — C50911 Malignant neoplasm of unspecified site of right female breast: Secondary | ICD-10-CM

## 2016-04-25 NOTE — Progress Notes (Signed)
Judy Velasquez has a pain level on 0/10 scale  0    Location breast    Description no pain    Radiation   no    Duration  1 year(s)    Time  none

## 2016-04-25 NOTE — Progress Notes (Signed)
[facility]     Amelita Whiteaker is here for cancer of the right Breast. DCIS  Procedure: lumpectomy Date: 03/2011   Hormonal Therapy:y  Chemotherapy: n  Radiation Therapy: y    Current Evaluation:   Today the patient is basically feeling well. She denies new breast mass, nipple discharge, or breast pain. No new subcutaneous mass, headache, bone pain, new cough, or abdominal pain.     Physical Exam:   Generally healthy appearing, Caucasian female   Skin: no new subcutaneous mass   Breast: non-tender, no new mass   Lungs clear   Heart RRR  Lymphadenopathy: no new axillary and supraclavicular   Abdomen: non-tender without liver, spleen, kidney, or mass palpable    Mammography:   Last mammogram: 03/2016   Personally reviewed by me. Mammogram is unchanged from previous mammograms. Radiologist report is negative     Impression:   No evidence of systemic or recurrent breast cancer:   No evidence of second primary breast cancer     Plan: RTC 1 year  Will need a mammogram in 1 year.

## 2016-05-03 LAB — RENAL FUNCTION PANEL
Albumin: 4.1 GM/DL (ref 3.4–5.0)
Anion Gap: 13 (ref 4–16)
BUN: 75 MG/DL — ABNORMAL HIGH (ref 6–23)
CO2: 23 MMOL/L (ref 21–32)
Calcium: 9.1 MG/DL (ref 8.3–10.6)
Chloride: 100 mMol/L (ref 99–110)
Creatinine: 3.3 MG/DL — ABNORMAL HIGH (ref 0.6–1.1)
GFR African American: 16 mL/min/{1.73_m2} — ABNORMAL LOW (ref >60)
GFR Non-African American: 13 mL/min/{1.73_m2} — ABNORMAL LOW (ref >60)
Glucose: 146 MG/DL — ABNORMAL HIGH (ref 70–140)
Phosphorus: 5.2 MG/DL — ABNORMAL HIGH (ref 2.5–4.9)
Potassium: 4.7 MMOL/L (ref 3.5–5.1)
Sodium: 136 MMOL/L (ref 135–145)

## 2016-05-23 NOTE — Telephone Encounter (Signed)
Spoke with Rod Holler from [facility] whom wanted to inform our office that the request for the [facility] genetic testing has been approved and that approval will be sent to our office and [facility] would be notified as well.

## 2016-05-28 LAB — RENAL FUNCTION PANEL
Albumin: 3.9 GM/DL (ref 3.4–5.0)
Anion Gap: 11 (ref 4–16)
BUN: 57 MG/DL — ABNORMAL HIGH (ref 6–23)
CO2: 30 MMOL/L (ref 21–32)
Calcium: 8.8 MG/DL (ref 8.3–10.6)
Chloride: 100 mMol/L (ref 99–110)
Creatinine: 2.6 MG/DL — ABNORMAL HIGH (ref 0.6–1.1)
GFR African American: 21 mL/min/{1.73_m2} — ABNORMAL LOW (ref >60)
GFR Non-African American: 18 mL/min/{1.73_m2} — ABNORMAL LOW (ref >60)
Glucose: 129 MG/DL (ref 70–140)
Phosphorus: 5.2 MG/DL — ABNORMAL HIGH (ref 2.5–4.9)
Potassium: 4.1 MMOL/L (ref 3.5–5.1)
Sodium: 141 MMOL/L (ref 135–145)

## 2016-07-16 ENCOUNTER — Ambulatory Visit: Admit: 2016-07-16 | Discharge: 2016-07-16 | Payer: MEDICARE | Attending: Surgery | Primary: Internal Medicine

## 2016-07-16 DIAGNOSIS — Z1379 Encounter for other screening for genetic and chromosomal anomalies: Secondary | ICD-10-CM

## 2016-07-16 NOTE — Progress Notes (Signed)
Judy Velasquez has a pain level on 0/10 scale  0    Location none    Description no pain    Radiation   No    Duration  3 month(s)    Time  none

## 2016-07-16 NOTE — Progress Notes (Signed)
Reviewed with the pt her myriad results showing no genetic mutation for breast cancer.    I informed her that even though she has no mutation, her children are still at risk as she has had breast cancer.    Information given to the pt.     She is to return next summer for follow up.

## 2016-07-18 LAB — RENAL FUNCTION PANEL
Albumin: 3.8 GM/DL (ref 3.4–5.0)
Anion Gap: 15 (ref 4–16)
BUN: 47 MG/DL — ABNORMAL HIGH (ref 6–23)
CO2: 25 MMOL/L (ref 21–32)
Calcium: 8.9 MG/DL (ref 8.3–10.6)
Chloride: 98 mMol/L — ABNORMAL LOW (ref 99–110)
Creatinine: 2.2 MG/DL — ABNORMAL HIGH (ref 0.6–1.1)
GFR African American: 26 mL/min/{1.73_m2} — ABNORMAL LOW (ref >60)
GFR Non-African American: 21 mL/min/{1.73_m2} — ABNORMAL LOW (ref >60)
Glucose: 182 MG/DL — ABNORMAL HIGH (ref 70–140)
Phosphorus: 3.9 MG/DL (ref 2.5–4.9)
Potassium: 4.2 MMOL/L (ref 3.5–5.1)
Sodium: 138 MMOL/L (ref 135–145)

## 2016-07-29 ENCOUNTER — Encounter: Primary: Internal Medicine

## 2016-08-05 ENCOUNTER — Encounter: Primary: Internal Medicine

## 2016-09-01 NOTE — Progress Notes (Deleted)
[place] [facility]  Telephone:(336) [phone] Fax:(336) [phone]  ID: Dahna Hattabaugh OB: June 01, 1929  MR#: [id number]  JGG#:[id number]  Patient Care Team: Dion Body, MD as PCP - General (Family Medicine)  CHIEF COMPLAINT: Anemia secondary to chronic kidney failure.  INTERVAL HISTORY: Patient returns to clinic today for further evaluation and treatment planning. She continues to feel well and is asymptomatic. She she does not complain of weakness or fatigue. She has no neurologic complaints. She denies any recent fevers or illnesses. She denies any chest pain or shortness of breath. She has no nausea, vomiting, constipation, or diarrhea. She has no melena or hematochezia. She has no urinary complaints. Patient feels at her baseline and offers no specific complaints today.  REVIEW OF SYSTEMS:   Review of Systems  Constitutional: Negative.  Negative for fever, malaise/fatigue and weight loss.  Respiratory: Negative.  Negative for cough, hemoptysis and shortness of breath.   Cardiovascular: Negative.  Negative for chest pain.  Gastrointestinal: Negative.  Negative for blood in stool and melena.  Genitourinary: Negative.  Negative for hematuria.  Musculoskeletal: Negative.   Neurological: Negative.  Negative for weakness.  Psychiatric/Behavioral: Negative.     As per HPI. Otherwise, a complete review of systems is negatve.  PAST MEDICAL HISTORY: Past Medical History:  Diagnosis Date  . Arthritis    osteoarthritis  . Cancer 2012   Rt. Breast non invasive  . CHF (congestive heart failure)   . Chronic kidney disease   . Diabetes mellitus without complication   . Glaucoma   . Headache(784.0)    sinus  . Hyperlipidemia   . Hypertension   . Myocardial infarction ([place]) 1996  . Pneumonia   . Shortness of breath     PAST SURGICAL HISTORY: Past Surgical History:  Procedure Laterality Date  . ABDOMINAL HYSTERECTOMY    . c section    . CESAREAN SECTION    .  CESAREAN SECTION    . CHOLECYSTECTOMY    . CORONARY ARTERY BYPASS GRAFT     triple   . EUS N/A 12/17/2012   Procedure: UPPER ENDOSCOPIC ULTRASOUND (EUS) LINEAR;  Surgeon: Milus Banister, MD;  [facility];  Service: Endoscopy;  Laterality: N/A;  . EUS N/A 01/06/2014   Procedure: UPPER ENDOSCOPIC ULTRASOUND (EUS) LINEAR;  Surgeon: Milus Banister, MD;  Location[facility];  Service: Endoscopy;  Laterality: N/A;  . JOINT REPLACEMENT     hip bilateral  . Right total knee    . TOTAL HIP ARTHROPLASTY      FAMILY HISTORY No family history on file.     ADVANCED DIRECTIVES:    HEALTH MAINTENANCE: Social History  Substance Use Topics  . Smoking status: Never Smoker  . Smokeless tobacco: Never Used  . Alcohol use No     Colonoscopy:  PAP:  Bone density:  Lipid panel:  Allergies  Allergen Reactions  . Celebrex [Celecoxib] Anaphylaxis  . Fish Allergy Anaphylaxis  . Shellfish Allergy Anaphylaxis    Current Outpatient Prescriptions  Medication Sig Dispense Refill  . acetaminophen (TYLENOL) 500 MG tablet Take 500 mg by mouth every 6 (six) hours as needed for pain.    Marland Kitchen alendronate (FOSAMAX) 70 MG tablet Take 70 mg by mouth every Sunday. Reported on 02/20/2016    . anastrozole (ARIMIDEX) 1 MG tablet Take by mouth.    Marland Kitchen atorvastatin (LIPITOR) 20 MG tablet Take by mouth.    Marland Kitchen b complex vitamins capsule Take 1 capsule by mouth daily.    Marland Kitchen  carvedilol (COREG) 6.25 MG tablet Take by mouth.    . diltiazem (TIAZAC) 240 MG 24 hr capsule Take by mouth.    . fexofenadine (ALLEGRA) 180 MG tablet Take by mouth.    . fluticasone (FLONASE) 50 MCG/ACT nasal spray Place into the nose.    . furosemide (LASIX) 40 MG tablet Take by mouth.    Marland Kitchen glipiZIDE (GLUCOTROL) 10 MG tablet Take 10 mg by mouth daily as needed (If blood sugar gets around 200 will take one). Reported on 02/20/2016    . hydrALAZINE (APRESOLINE) 10 MG tablet Take by mouth.    . iron polysaccharides (NIFEREX) 150 MG  capsule Take by mouth.    . latanoprost (XALATAN) 0.005 % ophthalmic solution     . LITE TOUCH LANCETS MISC     . losartan (COZAAR) 25 MG tablet TAKE 1 TABLET (25 MG TOTAL) BY MOUTH ONCE DAILY.  1  . naphazoline-pheniramine (NAPHCON-A) 0.025-0.3 % ophthalmic solution Apply to eye.    . pioglitazone (ACTOS) 15 MG tablet Take by mouth.     No current facility-administered medications for this visit.     OBJECTIVE: There were no vitals filed for this visit.   There is no height or weight on file to calculate BMI.    ECOG FS:0 - Asymptomatic  General: Well-developed, well-nourished, no acute distress. Eyes: Pink conjunctiva, anicteric sclera. Lungs: Clear to auscultation bilaterally. Heart: Regular rate and rhythm. No rubs, murmurs, or gallops. Abdomen: Soft, nontender, nondistended. No organomegaly noted, normoactive bowel sounds. Musculoskeletal: No edema, cyanosis, or clubbing. Neuro: Alert, answering all questions appropriately. Cranial nerves grossly intact. Skin: No rashes or petechiae noted. Psych: Normal affect.   LAB RESULTS:  Lab Results  Component Value Date   NA 139 11/19/2013   K 3.8 11/19/2013   CL 106 11/19/2013   CO2 26 11/19/2013   GLUCOSE 77 11/19/2013   BUN 28 (H) 11/19/2013   CREATININE 1.69 (H) 11/19/2013   CALCIUM 8.4 (L) 11/19/2013   PROT 6.9 11/16/2013   ALBUMIN 3.5 11/16/2013   AST 15 11/16/2013   ALT 9 (L) 11/16/2013   ALKPHOS 58 11/16/2013   BILITOT 0.7 11/16/2013   GFRNONAA 28 (L) 11/19/2013   GFRAA 32 (L) 11/19/2013    Lab Results  Component Value Date   WBC 5.1 02/20/2016   NEUTROABS 5.3 11/17/2013   HGB 7.8 (L) 02/20/2016   HCT 22.1 (L) 02/20/2016   MCV 97.6 02/20/2016   PLT 174 02/20/2016   Lab Results  Component Value Date   IRON 71 02/20/2016   TIBC 247 (L) 02/20/2016   IRONPCTSAT 29 02/20/2016    Lab Results  Component Value Date   FERRITIN 161 02/20/2016     STUDIES: No results found.  ASSESSMENT: Anemia secondary  to chronic kidney failure.  PLAN:    1. Anemia secondary to chronic kidney failure: Likely secondary to patient's mild chronic renal insufficiency. Iron stores, B12, folate, and hemolysis labs are all either negative or within normal limits. Patient's erythropoietin level as well as her reticulocyte count are inappropriately normal. She may have a component of MDS, but this would take a bone marrow biopsy to diagnose which is not necessary in an 82 year old female. She may benefit from Procrit. Patient is going to [place] for several months, therefore return to clinic in 6 months with repeat laboratory work and further evaluation.   Patient expressed understanding and was in agreement with this plan. She also understands that She can call clinic at any time  with any questions, concerns, or complaints.    Lloyd Huger, MD   09/01/2016 11:43 PM

## 2016-09-03 ENCOUNTER — Other Ambulatory Visit: Payer: Medicare HMO

## 2016-09-03 ENCOUNTER — Ambulatory Visit: Payer: Medicare HMO | Admitting: Oncology

## 2016-09-03 ENCOUNTER — Inpatient Hospital Stay: Payer: Medicare HMO

## 2016-09-03 ENCOUNTER — Inpatient Hospital Stay: Payer: Medicare HMO | Admitting: Oncology

## 2016-09-03 ENCOUNTER — Ambulatory Visit: Payer: Medicare HMO

## 2016-09-24 ENCOUNTER — Other Ambulatory Visit: Payer: Medicare HMO

## 2016-09-24 ENCOUNTER — Ambulatory Visit: Payer: Medicare HMO

## 2016-09-24 ENCOUNTER — Ambulatory Visit: Payer: Medicare HMO | Admitting: Oncology

## 2016-11-11 ENCOUNTER — Inpatient Hospital Stay: Admit: 2016-11-11 | Discharge: 2016-11-11 | Attending: Emergency Medicine

## 2016-11-11 ENCOUNTER — Emergency Department: Admit: 2016-11-11 | Primary: Internal Medicine

## 2016-11-11 DIAGNOSIS — M25551 Pain in right hip: Secondary | ICD-10-CM

## 2016-11-11 MED ORDER — HYDROMORPHONE HCL 1 MG/ML IJ SOLN
1 MG/ML | Freq: Once | INTRAMUSCULAR | Status: AC
Start: 2016-11-11 — End: 2016-11-11
  Administered 2016-11-11: 20:00:00 0.5 mg via INTRAMUSCULAR

## 2016-11-11 MED FILL — HYDROMORPHONE HCL 1 MG/ML IJ SOLN: 1 MG/ML | INTRAMUSCULAR | Qty: 1

## 2016-11-11 NOTE — ED Provider Notes (Signed)
Triage Chief Complaint:   Hip Pain (x 4 days NKI worse since yesterday) and Emesis (since yesterday)    HOPI:  Judy Velasquez is a 83 y.o. female that presents to the ED by herself complaining of right appearing.  The patient was sent to surgeries in the past.  On Thursday she noticed some pain which was in bed she has not tried anything for this she denied given to see orthopedic doctor or primary care doctor now presents to the emergency department this early afternoon.  No radiation or numbness or tingling no risk factors for trauma or infectious issues.    No results found for this visit on 11/11/16.    I estimate there is LOW risk for CAUDA EQUINA or CENTRAL CORD SYNDROME, EPIDURAL MASS LESION, MENINGITIS, SPINAL STENOSIS, OR HERNIATED DISK CAUSING SEVERE STENOSIS, thus I consider the discharge disposition reasonable. [name] and I have discussed the diagnosis and risks, and we agree with discharging home to follow-up with their primary doctor. We also discussed returning to the Emergency Department immediately if new or worsening symptoms occur. We have discussed the symptoms which are most concerning (e.g., saddle anesthesia, urinary or bowel incontinence or retention, changing or worsening pain) that necessitate immediate return.    FInal Impression    1. Right hip pain        Blood pressure (!) 148/67, pulse 74, temperature 98 F (36.7 C), temperature source Oral, resp. rate 16, height 5' (1.524 m), weight 100 lb (45.4 kg), SpO2 99 %, not currently breastfeeding.        Past Medical History:   Diagnosis Date   . Allergic rhinitis    . Anemia    . Anesthesia     Nausea/Vomiting Post Op In Past   . Anxiety    . Arthritis     s/p bilateral hip and right knee replacement sees Dr. Salomon Mast   . Back problem     "Occ Back Hurts"   . Blood transfusion    . CAD (coronary artery disease) 2008    Sees Dr. Marcelino Freestone CABG x 3   . DCIS (ductal carcinoma in situ) 05/20/2011   . Glaucoma     Bilateral Eyes   . H/O  cardiovascular stress test 05/20/11   . Heart attack 2006   . HOH (hard of hearing)     Bilateral Ears   . Hyperlipidemia    . Hypertension    . IBS (irritable bowel syndrome)    . Kidney problem     "Sees  Dr. Wallis Bamberg For Blood Pressure And Kidneys"   . Nausea & vomiting     Nausea/Vomiting Post Op In Past   . Osteoarthritis    . Osteoporosis    . Right Breast Cancer Dx 7-12   . Shortness of breath on exertion    . Staph infection 1980's    "They sent me to a tropical infection doctor"   . TB (tuberculosis)     "Probably had it as a child, my dad had it"   . Type II or unspecified type diabetes mellitus without mention of complication, not stated as uncontrolled Dx 1980's     Past Surgical History:   Procedure Laterality Date   . BREAST LUMPECTOMY  05/20/2011    Right   . BREAST SURGERY  7-12    Right Breast Biopsy   . CARDIAC SURGERY  2008    CABG (3 Bypasses)   . CARPAL TUNNEL RELEASE  2000's  Right   . CESAREAN SECTION  "Late 1950's or Early 1960's"    X 3   . CHOLECYSTECTOMY, LAPAROSCOPIC  1990's   . COLONOSCOPY  2000's   . CORONARY ARTERY BYPASS GRAFT  2008    cabg x 3   . ENDOSCOPY, COLON, DIAGNOSTIC  1990's   . HYSTERECTOMY, TOTAL ABDOMINAL  1960's   . JOINT REPLACEMENT  1980's    Total Right Hip   . JOINT REPLACEMENT  2000's    Total Right Hip   . JOINT REPLACEMENT  1990's    Total Left Hip   . JOINT REPLACEMENT  1980's    Total Right Knee   . OTHER SURGICAL HISTORY      Family Physician Is Dr. Berneice Heinrich In [place], [place]   . OTHER SURGICAL HISTORY  2000's    "Took section of my intestines out, it was a exploratory operation, they found scar tissue from the c-sections"   . TONSILLECTOMY AND ADENOIDECTOMY  1939     Family History   Problem Relation Age of Onset   . Heart Disease Mother    . Cancer Mother      "Cancer In Her Eye"   . Depression Mother    . Mental Illness Mother    . Diabetes Father    . Heart Disease Father      "Heart Attack"   . Hearing Loss Father    . Cancer Sister      "Breast Cancer"    . Early Death Brother      "At [employer]   . Diabetes Sister    . Vision Loss Son      "Eye Problems"   . Other Son      "Alot of health problems due to motorcycle wreck"   . Arthritis Daughter    . Early Death Son      "Early 68's"     Social History     Social History   . Marital status: Widowed     Spouse name: N/A   . Number of children: N/A   . Years of education: N/A     Occupational History   . Not on file.     Social History Main Topics   . Smoking status: Never Smoker   . Smokeless tobacco: Never Used   . Alcohol use No   . Drug use: No   . Sexual activity: No     Other Topics Concern   . Not on file     Social History Narrative    Do you donate blood or plasma? No    Caffeine intake? Moderate    Advance directive? No    Is blood transfusion acceptable in an emergency? Yes    Live alone or with others? With others    Sunscreen used routinely? No able to care for self? Yes             No current facility-administered medications for this encounter.      Current Outpatient Prescriptions   Medication Sig Dispense Refill   . anastrozole (ARIMIDEX) 1 MG tablet Take 1 tablet by mouth daily 30 tablet 11   . diltiazem (TIAZAC) 240 MG SR capsule Take 240 mg by mouth every evening.       . furosemide (LASIX) 40 MG tablet Take 40 mg by mouth daily      . carvedilol (COREG) 6.25 MG tablet Take 3.125 mg by mouth 2 times daily      .  hydrALAZINE (APRESOLINE) 10 MG tablet Take 20 mg by mouth 3 times daily One and one half Tablets Twice Daily     . B Complex-Biotin-FA (SUPER B-50 B COMPLEX PO) Take by mouth     . polyethylene glycol (GLYCOLAX) powder Take 17 g by mouth daily     . fluticasone (FLONASE) 50 MCG/ACT nasal spray 1 spray by Nasal route daily     . Polysaccharide Iron Complex (FERREX 150 PO) Take  by mouth nightly.       March Rummage (NAPHCON-A OP) Apply  to eye as needed. Over The Counter      . latanoprost (XALATAN) 0.005 % ophthalmic solution Place 1 drop into both eyes nightly.       Allergies    Allergen Reactions   . Celebrex [Celecoxib] Shortness Of Breath, Swelling and Anaphylaxis     "Swelling Of The Esophagus"   . Fish Allergy Anaphylaxis   . Lisinopril Other (See Comments)     Dry Mouth   . Neurontin [Gabapentin] Other (See Comments)     Dizziness   . Norvasc [Amlodipine Besylate] Other (See Comments)     Edema Of Legs   . Other      "Allergic To Certain Seafoods Causing Headaches, Difficulty Breathing"   . Shellfish Allergy Anaphylaxis   . Ciprofloxacin Rash   . Lipitor Other (See Comments)     Ache   . Losartan      Per nephrologist         ROS:    Review of Systems   Musculoskeletal: Negative for back pain.        R hip pain    Neurological: Negative for weakness.   All other systems reviewed and are negative.      Nursing Notes Reviewed    Physical Exam:  ED Triage Vitals [11/11/16 1337]   Enc Vitals Group      BP (!) 148/67      Pulse 74      Resp 16      Temp 98 F (36.7 C)      Temp Source Oral      SpO2 99 %      Weight 100 lb (45.4 kg)      Height 5' (1.524 m)      Head Circumference       Peak Flow       Pain Score       Pain Loc       Pain Edu?       Excl. in GC?        Physical Exam   Constitutional: She is oriented to person, place, and time. She appears well-developed and well-nourished.   HENT:   Head: Normocephalic and atraumatic.   Eyes: Pupils are equal, round, and reactive to light.   Neck: Normal range of motion. Neck supple.   Musculoskeletal: Normal range of motion.        Right hip: She exhibits tenderness. She exhibits normal range of motion, normal strength, no bony tenderness, no swelling, no crepitus, no deformity and no laceration.        Legs:  Neurological: She is alert and oriented to person, place, and time.   Skin: Skin is warm and dry.   Psychiatric: She has a normal mood and affect.   Nursing note and vitals reviewed.      I have reviewed and interpreted all of the currently available lab results from this visit (if applicable):  No  results found for this visit on  11/11/16.   Radiographs (if obtained):  []  The following radiograph was interpreted by myself in the absence of a radiologist:   []  Radiologist's Report Reviewed:  XR HIP RIGHT (2-3 VIEWS)   Final Result   No evidence of fracture or prosthetic dislocation               EKG (if obtained): (All EKG's are interpreted by myself in the absence of a cardiologist)    Chart review shows recent radiographs:  No results found.    MDM:    Pt presents to ED c/o non traumatic hip pain. Xray reveals stable post op changes.     Clinical Impression:  1. Right hip pain      Disposition referral (if applicable):  Gladstone Pih, MD  [address]  [address]  [place] [address]  [phone]    Go in 1 week      Disposition medications (if applicable):  Discharge Medication List as of 11/11/2016  3:19 PM              Renae Fickle A. Lusine Corlett, DO, FACEP      Comment: Please note this report has been produced using speech recognition software and may contain errors related to that system including errors in grammar, punctuation, and spelling, as well as words and phrases that may be inappropriate. If there are any questions or concerns please feel free to contact the dictating provider for clarification.        Avelino Leeds, DO  11/11/16 1851

## 2016-11-12 ENCOUNTER — Inpatient Hospital Stay: Admit: 2016-11-12 | Discharge: 2016-11-12

## 2016-11-12 DIAGNOSIS — M25551 Pain in right hip: Secondary | ICD-10-CM

## 2016-11-12 MED ORDER — HYDROMORPHONE HCL 1 MG/ML IJ SOLN
1 MG/ML | Freq: Once | INTRAMUSCULAR | Status: AC
Start: 2016-11-12 — End: 2016-11-12
  Administered 2016-11-12: 23:00:00 1 mg via INTRAMUSCULAR

## 2016-11-12 MED ORDER — LIDOCAINE 5 % EX PTCH
5 % | MEDICATED_PATCH | Freq: Every day | CUTANEOUS | 0 refills | Status: DC
Start: 2016-11-12 — End: 2017-04-25

## 2016-11-12 MED ORDER — LIDOCAINE 5 % EX PTCH
5 % | Freq: Once | CUTANEOUS | Status: DC
Start: 2016-11-12 — End: 2016-11-12
  Administered 2016-11-12: 23:00:00 1 via TRANSDERMAL

## 2016-11-12 MED ORDER — METHYLPREDNISOLONE SODIUM SUCC 40 MG IJ SOLR
40 MG | Freq: Once | INTRAMUSCULAR | Status: AC
Start: 2016-11-12 — End: 2016-11-12
  Administered 2016-11-12: 23:00:00 40 mg via INTRAMUSCULAR

## 2016-11-12 MED FILL — HYDROMORPHONE HCL 1 MG/ML IJ SOLN: 1 MG/ML | INTRAMUSCULAR | Qty: 1

## 2016-11-12 MED FILL — LIDODERM 5 % EX PTCH: 5 % | CUTANEOUS | Qty: 1

## 2016-11-12 MED FILL — SOLU-MEDROL 40 MG IJ SOLR: 40 MG | INTRAMUSCULAR | Qty: 40

## 2016-11-12 NOTE — ED Provider Notes (Signed)
eMERGENCY dEPARTMENT eNCOUnter      PCP: [name], MD    CHIEF COMPLAINT    Chief Complaint   Patient presents with   ??? Hip Pain     Right hip pain, states she was here yesterday for the same thing, patient stated the injection she recieved yesterday only lasted a few hours.         HPI    Judy Velasquez is a 83 y.o. female who presents with right hip pain With no recent trauma, injury or falls. Patient reports years of chronic pain and this had been has been seen by orthopedic physicians in the past and received injections in his hip which have alleviated her pain.  She was here yesterday where she was given a dose of pain medication, however she reports pain gradually return several hours after going home. She is requesting something more for pain.  Pain is most significant in lateral aspect of right hip with some radiation into right lower extremity.  Pain is constant with walking, sitting and lying down.  Patient denies associated back pain and no bowel/bladder incontinence, urinary retention, or saddle anesthesias.   No distal numbness, tingling, weakness, or functional deficit.   No other pain.       REVIEW OF SYSTEMS    General: Denies fever or constitutional symptoms.  Cardiac: Denies chest wall injury or chest pain  Pulmonary: Denies chest wall injury or shortness of breath  GI: Denies abdomen injury or abdominal pain  Musculoskeletal:  Denies any other musculoskeletal pain or injuries, including chest wall and back.  Skin:  Skin intact.  Lymphatics:  No nodules or streaks.      See HPI and nursing notes for additional information     PAST MEDICAL & SURGICAL HISTORY    Past Medical History:   Diagnosis Date   ??? Allergic rhinitis    ??? Anemia    ??? Anesthesia     Nausea/Vomiting Post Op In Past   ??? Anxiety    ??? Arthritis     s/p bilateral hip and right knee replacement sees Dr. Chriss Czar   ??? Back problem     "Occ Back Hurts"   ??? Blood transfusion    ??? CAD (coronary artery disease) 2008    Sees Dr. Brandt Loosen  CABG x 3   ??? DCIS (ductal carcinoma in situ) 05/20/2011   ??? Glaucoma     Bilateral Eyes   ??? H/O cardiovascular stress test 05/20/11   ??? Heart attack 2006   ??? HOH (hard of hearing)     Bilateral Ears   ??? Hyperlipidemia    ??? Hypertension    ??? IBS (irritable bowel syndrome)    ??? Kidney problem     "Sees  Dr. Maretta Bees For Blood Pressure And Kidneys"   ??? Nausea & vomiting     Nausea/Vomiting Post Op In Past   ??? Osteoarthritis    ??? Osteoporosis    ??? Right Breast Cancer Dx 7-12   ??? Shortness of breath on exertion    ??? Staph infection 1980's    "They sent me to a tropical infection doctor"   ??? TB (tuberculosis)     "Probably had it as a child, my dad had it"   ??? Type II or unspecified type diabetes mellitus without mention of complication, not stated as uncontrolled Dx 1980's     Past Surgical History:   Procedure Laterality Date   ??? BREAST LUMPECTOMY  05/20/2011  Right   ??? BREAST SURGERY  7-12    Right Breast Biopsy   ??? CARDIAC SURGERY  2008    CABG (3 Bypasses)   ??? CARPAL TUNNEL RELEASE  2000's    Right   ??? CESAREAN SECTION  "Late 1950's or Early 1960's"    X 3   ??? CHOLECYSTECTOMY, LAPAROSCOPIC  1990's   ??? COLONOSCOPY  2000's   ??? CORONARY ARTERY BYPASS GRAFT  2008    cabg x 3   ??? ENDOSCOPY, COLON, DIAGNOSTIC  1990's   ??? HYSTERECTOMY, TOTAL ABDOMINAL  1960's   ??? JOINT REPLACEMENT  1980's    Total Right Hip   ??? JOINT REPLACEMENT  2000's    Total Right Hip   ??? JOINT REPLACEMENT  1990's    Total Left Hip   ??? JOINT REPLACEMENT  1980's    Total Right Knee   ??? OTHER SURGICAL HISTORY      Family Physician Is Dr. Evelina Bucy In [place], [place]   ??? OTHER SURGICAL HISTORY  2000's    "Took section of my intestines out, it was a exploratory operation, they found scar tissue from the c-sections"   ??? TONSILLECTOMY AND ADENOIDECTOMY  1939       CURRENT MEDICATIONS    Current Outpatient Rx   Medication Sig Dispense Refill   ??? lidocaine (LIDODERM) 5 % Place 1 patch onto the skin daily 12 hours on, 12 hours off. 30 patch 0   ??? B  Complex-Biotin-FA (SUPER B-50 B COMPLEX PO) Take by mouth     ??? polyethylene glycol (GLYCOLAX) powder Take 17 g by mouth daily     ??? fluticasone (FLONASE) 50 MCG/ACT nasal spray 1 spray by Nasal route daily     ??? anastrozole (ARIMIDEX) 1 MG tablet Take 1 tablet by mouth daily 30 tablet 11   ??? diltiazem (TIAZAC) 240 MG SR capsule Take 240 mg by mouth every evening.       ??? Polysaccharide Iron Complex (FERREX 150 PO) Take  by mouth nightly.       ??? Naphazoline-Pheniramine (NAPHCON-A OP) Apply  to eye as needed. Over The Counter      ??? latanoprost (XALATAN) 0.005 % ophthalmic solution Place 1 drop into both eyes nightly.     ??? furosemide (LASIX) 40 MG tablet Take 40 mg by mouth daily      ??? carvedilol (COREG) 6.25 MG tablet Take 3.125 mg by mouth 2 times daily      ??? hydrALAZINE (APRESOLINE) 10 MG tablet Take 20 mg by mouth 3 times daily One and one half Tablets Twice Daily         ALLERGIES    Allergies   Allergen Reactions   ??? Celebrex [Celecoxib] Shortness Of Breath, Swelling and Anaphylaxis     "Swelling Of The Esophagus"   ??? Fish Allergy Anaphylaxis   ??? Lisinopril Other (See Comments)     Dry Mouth   ??? Neurontin [Gabapentin] Other (See Comments)     Dizziness   ??? Norvasc [Amlodipine Besylate] Other (See Comments)     Edema Of Legs   ??? Other      "Allergic To Certain Seafoods Causing Headaches, Difficulty Breathing"   ??? Shellfish Allergy Anaphylaxis   ??? Ciprofloxacin Rash   ??? Lipitor Other (See Comments)     Ache   ??? Losartan      Per nephrologist       SOCIAL & FAMILY HISTORY    Social History  Social History   ??? Marital status: Widowed     Spouse name: N/A   ??? Number of children: N/A   ??? Years of education: N/A     Social History Main Topics   ??? Smoking status: Never Smoker   ??? Smokeless tobacco: Never Used   ??? Alcohol use No   ??? Drug use: No   ??? Sexual activity: No     Other Topics Concern   ??? None     Social History Narrative    Do you donate blood or plasma? No    Caffeine intake? Moderate    Advance  directive? No    Is blood transfusion acceptable in an emergency? Yes    Live alone or with others? With others    Sunscreen used routinely? No able to care for self? Yes             Family History   Problem Relation Age of Onset   ??? Heart Disease Mother    ??? Cancer Mother      "Cancer In Her Eye"   ??? Depression Mother    ??? Mental Illness Mother    ??? Diabetes Father    ??? Heart Disease Father      "Heart Attack"   ??? Hearing Loss Father    ??? Cancer Sister      "Breast Cancer"   ??? Early Death Brother      "At [employer]"   ??? Diabetes Sister    ??? Vision Loss Son      "Eye Problems"   ??? Other Son      "Alot of health problems due to motorcycle wreck"   ??? Arthritis Daughter    ??? Early Death Son      "Early 68's"           PHYSICAL EXAM    VITAL SIGNS: BP (!) 182/78    Pulse 58    Temp 97.8 ??F (36.6 ??C) (Oral)    Resp 16    Ht 5' (1.524 m)    Wt 89 lb (40.4 kg)    SpO2 99%    BMI 17.38 kg/m??   Constitutional:  Well developed, well nourished, no acute distress, non-toxic appearance   HENT:  Atraumatic    Musculoskeletal:   No erythema, bruising or swelling noted to right hip. Tenderness to palpation noted of right greater trochanter and right SI joint. Full ROM. Sensation is intact throughout. DP 2+.  No skin changes, tenderness or ROM deficit of back or right knee.     Examination of remaining extremities reveals active ROM of  joints without ligamentous laxity. There???s no obvious joint or bony deformity.    Abdomen: Soft nontender.  Integument:  Well hydrated, no rash. skin intact  Vascular: affected extremity distally neurovascularly intact - pulses, sensation, and capillary refill intact.  Neurologic:  Awake alert, normal flow of speech.  Cranial nerves II through XII grossly intact.  Psychiatric: Cooperative, pleasant affect        ED COURSE & MEDICAL DECISION MAKING       Vital signs and nursing notes reviewed during ED course.  I have independently evaluated this patient .  Supervising MD present in the Emergency  Department, available for consultation, throughout entirety of  patient care.  All pertinent Lab data and radiographic results reviewed with patient at bedside.     Patient returns to the ED with continued right hip pain. She was seen in this ED yesterday  and had negative imaging. She was given a shot of Dilaudid which did provide short term pain relief. Patient reports she has had cortisone injections in the joint in the past, however does not have a local orthopedic provider. On exam today, these is no erythema, swelling or warmth concerning for infectious etiology of symptoms. I suspect pain is associated with patient's history of arthritis vs sciatic involvement. Patient is given another dose of Dilaudid, solu-medrol and lidoderm patch is applied to hip.   On reexamintation, extremity remains neurovascularly intact and patient reports improvement in pain. I do watch patient ambulate in the ED without difficulty. Orthopedic follow up is provided for further evaluation and treatment options.     The patient and/or the family were informed of the results of any tests/labs/imaging, the treatment plan, and time was allotted to answer questions.         Clinical  IMPRESSION    1. Right hip pain    2. Elevated blood pressure reading        To follow up with PCP in 2 days for recheck and orthopedics in the next 5-7 days.  Diagnosis and plan discussed in detail with patient who understands and agrees.  Patient agrees to return emergency department if symptoms worsen or any new symptoms develop.      Comment: Please note this report has been produced using speech recognition software and may contain errors related to that system including errors in grammar, punctuation, and spelling, as well as words and phrases that may be inappropriate. If there are any questions or concerns please feel free to contact the dictating provider for clarification.        Remonia Richter, PA  11/14/16 [phone]

## 2016-12-10 ENCOUNTER — Inpatient Hospital Stay: Primary: Internal Medicine

## 2016-12-10 NOTE — Other (Signed)
Outpatient Physical Therapy           [place]           []  Phone: [phone]   Fax: [phone]  [place]           []  Phone: [phone]   Fax: [phone]    Physical Therapy Daily Treatment Note  Date:  12/10/2016    Patient Name:  Judy Velasquez    DOB:  10-Apr-1929  MRN: [id number]  Restrictions/Precautions:   Diagnosis:   Diagnosis: trochanteric bursitis, R hip pain.   Date of Surgery:   Treatment Diagnosis:      Insurance/Certification information:    Referring Physician:  Referring Practitioner: Dr. [id number]  Next Doctor Visit:    Plan of care signed (Y/N):  n  Visit# / total visits:  1 /  Pain level: 2/10   Goals:        hold for now            Subjective:  Pt presents with complaints of resolving R hip pain.  She relates an insidious onset 1 month ago, temporary relief with steroid injection, greater relief with steroid dose pack.  Doing much better now.  Just some minor pains.        Any changes in Ambulatory Summary Sheet? no      Objective:          Exercises:  Exercise/Equipment Date 12/10/16 Date Date Date     Hip wheel #4 gently 20 sec x 3        SLS 20 sec x 2        Bridging  2 x 10        clamshells 2x10                                                                                                                                                  Other Therapeutic Activities/Education:      Home Exercise Program:  Clamshell, bridging, SLS.    Modality/intervention used:    [x]  Therapeutic Exercise  []  Modalities:  []  Therapeutic Activity     []  Ultrasound  []  Elec  Stim  []  Gait Training      []  Cervical Traction []  Lumbar Traction  []  Neuromuscular Re-education    []  Cold/hotpack []  Iontophoresis   [x]  Instruction in HEP      []  Vasopneumatic     []  Manual Therapy               []  Aquatic Therapy     Manual Treatments:      Modalities:      Communication with other providers:      Education provided to patient/caregiver:      Adverse reactions to treatment:      Equipment provided:       Assessment:  Time In / Time Out:     1345/1430              Timed Code/Total Treatment Minutes:  15/45    Patients Report of Tolerance:    []  Patient limited by fatigue        []  Patient limited by pain   []  Patient limited by other medical complications   []  Other:     Prognosis:   [x]  Good []  Fair  []  Poor    Plan:   []  Continue per plan of care []  Alter current plan (see comments)  []  Plan of care initiated [x]  Hold 2 weeks []  Discharge    Plan for Next Session:          Next Progress Note due:            Electronically signed by:  Dwana Curd, PT 12/10/2016, 2:24 PM

## 2016-12-10 NOTE — Progress Notes (Signed)
Physical Therapy  Initial Assessment  Date: 12/10/2016  Patient Name: Judy Velasquez  MRN: [id number]  DOB: [birth year]     Treatment Diagnosis: trochanteric bursitis.     Restrictions   B THA, R TKA    Subjective   General  Additional Pertinent Hx: Pt complainse of R hip pain onset one month ago insidiously.  some tempory relief with injection, better relief with oral steroid.  still has some pain in R hip.   Hx of B THA, R TKA.   Family / Caregiver Present: Yes (granddaughter)  Referring Practitioner: Dr. Everett Graff  Referral Date : 11/28/16  Diagnosis: trochanteric bursitis, R hip pain.   General Comment  Comments: uses walker at home.  cane some.  been using a cane for 3-4 yrs  PT Visit Information  PT Insurance Information: medicare replacement.   Subjective  Subjective: complains of R hip pain.   Pain Screening  Patient Currently in Pain: Yes  Vital Signs  Patient Currently in Pain: Yes    Vision/Hearing  Vision  Vision: Impaired  Hearing  Hearing: Exceptions to [facility]    Orientation  Orientation  Overall Orientation Status: Within Functional Limits    Social/Functional History  Social/Functional History  Lives With: Family  Type of Home: House  Home Layout: Able to Live on Main level with bedroom/bathroom  ADL Assistance: Independent  Homemaking Assistance: Independent  Homemaking Responsibilities: Yes  Active Driver: No  Objective     Observation/Palpation  Palpation: mild tender R posterior aspect greater trochanter    AROM RLE (degrees)  RLE AROM: WFL  AROM LLE (degrees)  LLE AROM : WFL    Strength RLE  Strength RLE: WFL  Comment: HS 4, DF 4+  Strength LLE  Strength LLE: WFL  Comment: HS 4, DF 4        Sensation  Overall Sensation Status:  (dermatomes intact)                                     Assessment   Conditions Requiring Skilled Therapeutic Intervention  Assessment: Pt presents with complaints of resolving R hip pain.  She relates an insidious onset 1 month ago, temporary relief with steroid injection,  greater relief with steroid dose pack.  Doing much better now.  Just some minor pains now.  Minor tenderness at posterior border of greater trochanter.  LE strength is grossly wfl, minor HS weakness B.  dermatomes intact to lt touch.  denies back pain, radicular symptoms.  she was instructed in a basic light strengthening program and to call with any problems or lack of improvement in 2 weeks.    Treatment Diagnosis: trochanteric bursitis.   Prognosis: Good  REQUIRES PT FOLLOW UP: No (hold for now)         Plan        G-Code  PT G-Codes  Functional Limitation: Mobility: Walking and moving around  Mobility: Walking and Moving Around Current Status (U7253): At least 1 percent but less than 20 percent impaired, limited or restricted  Mobility: Walking and Moving Around Goal Status [phone]): At least 1 percent but less than 20 percent impaired, limited or restricted  Mobility: Walking and Moving Around Discharge Status [id number]): At least 1 percent but less than 20 percent impaired, limited or restricted                 Goals  Long term goals  Time Frame for Long term goals : hold for now        Jens Som, PT

## 2016-12-10 NOTE — Plan of Care (Signed)
Outpatient Physical Therapy        [x]  Phone: [phone]   Fax: [phone]   Pediatric Therapy          []  Phone: [phone]   Fax: [phone]  Pediatric [facility]          []  Phone: [phone]   Fax: [phone]      To: Referring Practitioner: Dr. Mervin Hack    From: Dwana Curd, PT     Patient: Judy Velasquez       DOB: [birth year]  Diagnosis: trochanteric bursitis, R hip pain.    Treatment Diagnosis: trochanteric bursitis.    Date: 12/10/2016    Physical Therapy Certification/Re-Certification Form  Dear Dr. Mervin Hack  The following patient has been evaluated for physical therapy services and for therapy to continue, Please review the attached evaluation and/or summary of the patient's plan of care, and verify that you agree therapy should continue by signing the attached document and sending it back to our office.  Patient is a  83 yo female who presents with R hip pain which impacts on adls;patient's goal is to finish resolving the pain ;patient reports that pain  limits activities including long walks, prolonged standing; PT to address patient's goals, impairments and activity limitations with skilled interventions checked in plan of care;patient's level of function prior to onset of pain was independent; did not observe any barriers to learning during PT eval - but does has some vision difficulties and HOH; learning preferences include demonstration, practice, and handouts; patient expressed understanding of HEP; patient appears to be motivated to participate in an active PT program and to be compliant with HEP expectations;patient assisted in developing treatment plan and goals; no DME is currently being used;          Assessment:  Pt presents with complaints of resolving R hip pain.  She relates an insidious onset 1 month ago, temporary relief with steroid injection, greater relief with steroid dose pack.  Doing much better now.  Just some minor pains now.  Minor tenderness at posterior border of  greater trochanter.  LE strength is grossly wfl, minor HS weakness B.  dermatomes intact to lt touch.  denies back pain, radicular symptoms.  she was instructed in a basic light strengthening program and to call with any problems or lack of improvement in 2 weeks.      Plan of Care/Treatment to date:  [x]  Therapeutic Exercise    []  Aquatics:  []  Therapeutic Activity    []  Ultrasound  []  Elec Stimulation  []  Gait Training     []  Cervical Traction []  Lumbar Traction  []  Neuromuscular Re-education []  Cold/hotpack []  Iontophoresis   [x]  Instruction in HEP       []  Manual Therapy     []  vasopneumatic            []  Self care home management        [] Dry needling trigger point point/pain management    ?      Frequency/Duration:  Hold for now.   # Days per week: []  1 day # Weeks: []  1 week []  5 weeks     []  2 days?   []  2 weeks []  6 weeks     []  3 days   []  3 weeks []  7 weeks     []  4 days   []  4 weeks []  8 weeks    Rehab Potential/Progress: []  Excellent [x]  Good []  Fair  []  Poor     Goals:  Long term goals  Time Frame for Long term goals : hold for now       Electronically signed by:  Dwana Curd, PT, 12/10/2016, 2:42 PM              If you have any questions or concerns, please don't hesitate to call.  Thank you for your referral.      Physician Signature:_________________Date:____________Time: ________  By signing above, therapist???s plan is approved by physician

## 2016-12-27 LAB — CBC WITH AUTO DIFFERENTIAL
Basophils %: 0.3 % (ref 0–1)
Basophils Absolute: 0 10*3/uL
Eosinophils %: 3.1 % — ABNORMAL HIGH (ref 0–3)
Eosinophils Absolute: 0.2 10*3/uL
Hematocrit: 24 % — ABNORMAL LOW (ref 37–47)
Hemoglobin: 7.8 GM/DL — ABNORMAL LOW (ref 12.5–16.0)
Immature Neutrophil %: 0.6 % — ABNORMAL HIGH (ref 0–0.43)
Lymphocytes %: 19 % — ABNORMAL LOW (ref 24–44)
Lymphocytes Absolute: 1.2 10*3/uL
MCH: 33.3 PG — ABNORMAL HIGH (ref 27–31)
MCHC: 32.5 % (ref 32.0–36.0)
MCV: 102.6 FL — ABNORMAL HIGH (ref 78–100)
MPV: 9.3 FL (ref 7.5–11.1)
Monocytes %: 10.4 % — ABNORMAL HIGH (ref 0–4)
Monocytes Absolute: 0.7 10*3/uL
Platelets: 196 10*3/uL (ref 140–440)
RBC: 2.34 10*6/uL — ABNORMAL LOW (ref 4.2–5.4)
RDW: 12.9 % (ref 11.7–14.9)
Segs Absolute: 4.2 10*3/uL
Segs Relative: 66.6 % — ABNORMAL HIGH (ref 36–66)
Total Immature Neutrophil: 0.04 10*3/uL
WBC: 6.4 10*3/uL (ref 4.0–10.5)

## 2016-12-30 ENCOUNTER — Inpatient Hospital Stay: Attending: Orthopaedic Surgery | Primary: Internal Medicine

## 2016-12-30 LAB — CBC WITH AUTO DIFFERENTIAL
Basophils %: 0.2 % (ref 0–1)
Basophils Absolute: 0 10*3/uL
Eosinophils %: 2.5 % (ref 0–3)
Eosinophils Absolute: 0.2 10*3/uL
Hematocrit: 23.1 % — ABNORMAL LOW (ref 37–47)
Hemoglobin: 7.5 GM/DL — ABNORMAL LOW (ref 12.5–16.0)
Immature Neutrophil %: 0.5 % — ABNORMAL HIGH (ref 0–0.43)
Lymphocytes %: 21.5 % — ABNORMAL LOW (ref 24–44)
Lymphocytes Absolute: 1.3 10*3/uL
MCH: 33.5 PG — ABNORMAL HIGH (ref 27–31)
MCHC: 32.5 % (ref 32.0–36.0)
MCV: 103.1 FL — ABNORMAL HIGH (ref 78–100)
MPV: 9.3 FL (ref 7.5–11.1)
Monocytes %: 11.8 % — ABNORMAL HIGH (ref 0–4)
Monocytes Absolute: 0.7 10*3/uL
Platelets: 154 10*3/uL (ref 140–440)
RBC: 2.24 10*6/uL — ABNORMAL LOW (ref 4.2–5.4)
RDW: 13.1 % (ref 11.7–14.9)
Segs Absolute: 3.8 10*3/uL
Segs Relative: 63.5 % (ref 36–66)
Total Immature Neutrophil: 0.03 10*3/uL
WBC: 5.9 10*3/uL (ref 4.0–10.5)

## 2017-01-01 LAB — CBC
Hematocrit: 23.8 % — ABNORMAL LOW (ref 37–47)
Hemoglobin: 7.5 GM/DL — ABNORMAL LOW (ref 12.5–16.0)
MCH: 32.6 PG — ABNORMAL HIGH (ref 27–31)
MCHC: 31.5 % — ABNORMAL LOW (ref 32.0–36.0)
MCV: 103.5 FL — ABNORMAL HIGH (ref 78–100)
MPV: 9.3 FL (ref 7.5–11.1)
Platelets: 152 10*3/uL (ref 140–440)
RBC: 2.3 10*6/uL — ABNORMAL LOW (ref 4.2–5.4)
RDW: 13.2 % (ref 11.7–14.9)
WBC: 6 10*3/uL (ref 4.0–10.5)

## 2017-01-01 LAB — VITAMIN B12: Vitamin B-12: 1403 pg/ml — ABNORMAL HIGH (ref 211–911)

## 2017-02-28 ENCOUNTER — Telehealth

## 2017-02-28 NOTE — Telephone Encounter (Signed)
mammo & BD

## 2017-03-24 LAB — RENAL FUNCTION PANEL
Albumin: 3.9 GM/DL (ref 3.4–5.0)
Anion Gap: 9 (ref 4–16)
BUN: 40 MG/DL — ABNORMAL HIGH (ref 6–23)
CO2: 30 MMOL/L (ref 21–32)
Calcium: 8.7 MG/DL (ref 8.3–10.6)
Chloride: 97 mMol/L — ABNORMAL LOW (ref 99–110)
Creatinine: 2.5 MG/DL — ABNORMAL HIGH (ref 0.6–1.1)
GFR African American: 22 mL/min/{1.73_m2} — ABNORMAL LOW (ref >60)
GFR Non-African American: 18 mL/min/{1.73_m2} — ABNORMAL LOW (ref >60)
Glucose: 163 MG/DL — ABNORMAL HIGH (ref 70–99)
Phosphorus: 3.2 MG/DL (ref 2.5–4.9)
Potassium: 5.2 MMOL/L — ABNORMAL HIGH (ref 3.5–5.1)
Sodium: 136 MMOL/L (ref 135–145)

## 2017-04-02 ENCOUNTER — Inpatient Hospital Stay: Admit: 2017-04-02 | Attending: Internal Medicine | Primary: Internal Medicine

## 2017-04-02 ENCOUNTER — Encounter

## 2017-04-02 DIAGNOSIS — R600 Localized edema: Secondary | ICD-10-CM

## 2017-04-24 ENCOUNTER — Encounter: Primary: Internal Medicine

## 2017-04-25 ENCOUNTER — Inpatient Hospital Stay: Admit: 2017-04-25 | Discharge: 2017-04-25 | Attending: Emergency Medicine

## 2017-04-25 DIAGNOSIS — D649 Anemia, unspecified: Secondary | ICD-10-CM

## 2017-04-25 LAB — CBC WITH AUTO DIFFERENTIAL
Basophils %: 0.3 % (ref 0–1)
Basophils Absolute: 0 10*3/uL
Eosinophils %: 1.7 % (ref 0–3)
Eosinophils Absolute: 0.1 10*3/uL
Hematocrit: 22.1 % — ABNORMAL LOW (ref 37–47)
Hemoglobin: 7.1 GM/DL — ABNORMAL LOW (ref 12.5–16.0)
Immature Neutrophil %: 0.2 % (ref 0–0.43)
Lymphocytes %: 12.6 % — ABNORMAL LOW (ref 24–44)
Lymphocytes Absolute: 0.8 10*3/uL
MCH: 33.8 PG — ABNORMAL HIGH (ref 27–31)
MCHC: 32.1 % (ref 32.0–36.0)
MCV: 105.2 FL — ABNORMAL HIGH (ref 78–100)
MPV: 10.3 FL (ref 7.5–11.1)
Monocytes %: 7.8 % — ABNORMAL HIGH (ref 0–4)
Monocytes Absolute: 0.5 10*3/uL
Platelets: 159 10*3/uL (ref 140–440)
RBC: 2.1 10*6/uL — ABNORMAL LOW (ref 4.2–5.4)
RDW: 13.6 % (ref 11.7–14.9)
Segs Absolute: 5 10*3/uL
Segs Relative: 77.4 % — ABNORMAL HIGH (ref 36–66)
Total Immature Neutrophil: 0.01 10*3/uL
WBC: 6.4 10*3/uL (ref 4.0–10.5)

## 2017-04-25 LAB — COMPREHENSIVE METABOLIC PANEL
ALT: 7 U/L — ABNORMAL LOW (ref 10–40)
AST: 25 IU/L (ref 15–37)
Albumin: 3.7 GM/DL (ref 3.4–5.0)
Alkaline Phosphatase: 55 IU/L (ref 40–129)
Anion Gap: 12 (ref 4–16)
BUN: 47 MG/DL — ABNORMAL HIGH (ref 6–23)
CO2: 27 MMOL/L (ref 21–32)
Calcium: 8.6 MG/DL (ref 8.3–10.6)
Chloride: 100 mMol/L (ref 99–110)
Creatinine: 2.1 MG/DL — ABNORMAL HIGH (ref 0.6–1.1)
GFR African American: 27 mL/min/{1.73_m2} — ABNORMAL LOW (ref >60)
GFR Non-African American: 22 mL/min/{1.73_m2} — ABNORMAL LOW (ref >60)
Glucose: 145 MG/DL — ABNORMAL HIGH (ref 70–99)
Potassium: 4.1 MMOL/L (ref 3.5–5.1)
Sodium: 139 MMOL/L (ref 135–145)
Total Bilirubin: 0.6 MG/DL (ref 0.0–1.0)
Total Protein: 6.2 GM/DL — ABNORMAL LOW (ref 6.4–8.2)

## 2017-04-25 LAB — PROTIME/INR & PTT
INR: 1.07 INDEX
Protime: 12.2 SECONDS (ref 10–14.3)
aPTT: 49.3 SECONDS — ABNORMAL HIGH (ref 24–40)

## 2017-04-25 MED ORDER — SODIUM CHLORIDE 0.9 % IV BOLUS
0.9 % | Freq: Once | INTRAVENOUS | Status: DC
Start: 2017-04-25 — End: 2017-04-25

## 2017-04-25 MED FILL — SODIUM CHLORIDE 0.9 % IV SOLN: 0.9 % | INTRAVENOUS | Qty: 250

## 2017-04-25 NOTE — ED Notes (Signed)
Dr. Evelena Leyden called and speaking with Dr. Arvilla Market, RN  04/25/17 1131

## 2017-04-25 NOTE — ED Provider Notes (Addendum)
Triage Chief Complaint:   Anemia (Was being evaluated for eye surgery and was cancelled due to low blood count)      HOPI:  Judy Velasquez is a 83 y.o. female that presents after being found to have a hemoglobin of 6.1.  She states that she was getting preop clearance for a cataract surgery.  Hemoglobin came back low.  She does report prior history of anemia requiring blood transfusion, last transfusion about 2 years ago.  She is unsure where her anemia is from but does have significant kidney dysfunction.  She denies being on any blood thinners.  She is on ferritin.  She denies having any dark stools.  She reports chronic fatigue and weakness that has more or less been unchanged but states that today, as she's been fasting and has been up all night worrying about her surgery, she is felt more tired than usual.  She denies having any associated fevers, chills, chest pain, cough, shortness of breath..    ROS:  At least 10 systems reviewed and otherwise negative except as in the [place].    Past Medical History:   Diagnosis Date   . Allergic rhinitis    . Anemia    . Anesthesia     Nausea/Vomiting Post Op In Past   . Anxiety    . Arthritis     s/p bilateral hip and right knee replacement sees Dr. Chriss Czar   . Back problem     "Occ Back Hurts"   . Blood transfusion    . CAD (coronary artery disease) 2008    Sees Dr. Brandt Loosen CABG x 3   . DCIS (ductal carcinoma in situ) 05/20/2011   . Glaucoma     Bilateral Eyes   . H/O cardiovascular stress test 05/20/11   . Heart attack 2006   . HOH (hard of hearing)     Bilateral Ears   . Hyperlipidemia    . Hypertension    . IBS (irritable bowel syndrome)    . Kidney problem     "Sees  Dr. Maretta Bees For Blood Pressure And Kidneys"   . Nausea & vomiting     Nausea/Vomiting Post Op In Past   . Osteoarthritis    . Osteoporosis    . Right Breast Cancer Dx 7-12   . Shortness of breath on exertion    . Staph infection 1980's    "They sent me to a tropical infection doctor"   . TB (tuberculosis)      "Probably had it as a child, my dad had it"   . Type II or unspecified type diabetes mellitus without mention of complication, not stated as uncontrolled Dx 1980's     Past Surgical History:   Procedure Laterality Date   . BREAST LUMPECTOMY  05/20/2011    Right   . BREAST SURGERY  7-12    Right Breast Biopsy   . CARDIAC SURGERY  2008    CABG (3 Bypasses)   . CARPAL TUNNEL RELEASE  2000's    Right   . CESAREAN SECTION  "Late 1950's or Early 1960's"    X 3   . CHOLECYSTECTOMY, LAPAROSCOPIC  1990's   . COLONOSCOPY  2000's   . CORONARY ARTERY BYPASS GRAFT  2008    cabg x 3   . ENDOSCOPY, COLON, DIAGNOSTIC  1990's   . HYSTERECTOMY, TOTAL ABDOMINAL  1960's   . JOINT REPLACEMENT  1980's    Total Right Hip   . JOINT REPLACEMENT  2000's    Total Right Hip   . JOINT REPLACEMENT  1990's    Total Left Hip   . JOINT REPLACEMENT  1980's    Total Right Knee   . OTHER SURGICAL HISTORY      Family Physician Is Dr. Evelina Bucy In [place], [place]   . OTHER SURGICAL HISTORY  2000's    "Took section of my intestines out, it was a exploratory operation, they found scar tissue from the c-sections"   . TONSILLECTOMY AND ADENOIDECTOMY  1939     Family History   Problem Relation Age of Onset   . Heart Disease Mother    . Cancer Mother         "Cancer In Her Eye"   . Depression Mother    . Mental Illness Mother    . Diabetes Father    . Heart Disease Father         "Heart Attack"   . Hearing Loss Father    . Cancer Sister         "Breast Cancer"   . Early Death Brother         "At [employer]   . Diabetes Sister    . Vision Loss Son         "Eye Problems"   . Other Son         "Alot of health problems due to motorcycle wreck"   . Arthritis Daughter    . Early Death Son         "Early 67's"     Social History     Social History   . Marital status: Widowed     Spouse name: N/A   . Number of children: N/A   . Years of education: N/A     Occupational History   . Not on file.     Social History Main Topics   . Smoking status: Never Smoker   . Smokeless  tobacco: Never Used   . Alcohol use No   . Drug use: No   . Sexual activity: No     Other Topics Concern   . Not on file     Social History Narrative    Do you donate blood or plasma? No    Caffeine intake? Moderate    Advance directive? No    Is blood transfusion acceptable in an emergency? Yes    Live alone or with others? With others    Sunscreen used routinely? No able to care for self? Yes             Current Facility-Administered Medications   Medication Dose Route Frequency Provider Last Rate Last Dose   . 0.9 % sodium chloride bolus  250 mL Intravenous Once Roney Mans, MD         Current Outpatient Prescriptions   Medication Sig Dispense Refill   . naphazoline-pheniramine (NAPHCON-A) 0.025-0.3 % ophthalmic solution 1 drop 4 times daily     . B Complex-Biotin-FA (SUPER B-50 B COMPLEX PO) Take by mouth     . polyethylene glycol (GLYCOLAX) powder Take 17 g by mouth daily     . fluticasone (FLONASE) 50 MCG/ACT nasal spray 1 spray by Nasal route daily     . anastrozole (ARIMIDEX) 1 MG tablet Take 1 tablet by mouth daily 30 tablet 11   . diltiazem (TIAZAC) 240 MG SR capsule Take 240 mg by mouth every evening.       . furosemide (LASIX) 40 MG tablet  Take 40 mg by mouth daily      . carvedilol (COREG) 6.25 MG tablet Take 3.125 mg by mouth 2 times daily      . hydrALAZINE (APRESOLINE) 10 MG tablet Take 20 mg by mouth 3 times daily One and one half Tablets Twice Daily       Allergies   Allergen Reactions   . Celebrex [Celecoxib] Shortness Of Breath, Swelling and Anaphylaxis     "Swelling Of The Esophagus"   . Fish Allergy Anaphylaxis   . Lisinopril Other (See Comments)     Dry Mouth   . Neurontin [Gabapentin] Other (See Comments)     Dizziness   . Norvasc [Amlodipine Besylate] Other (See Comments)     Edema Of Legs   . Other      "Allergic To Certain Seafoods Causing Headaches, Difficulty Breathing"   . Shellfish Allergy Anaphylaxis   . Ciprofloxacin Rash   . Lipitor Other (See Comments)     Ache   . Losartan       Per nephrologist       Nursing Notes Reviewed    Physical Exam:  ED Triage Vitals [04/25/17 0928]   Enc Vitals Group      BP (!) 175/73      Pulse 77      Resp 16      Temp 99 F (37.2 C)      Temp Source Oral      SpO2 95 %      Weight 95 lb (43.1 kg)      Height 5' (1.524 m)      Head Circumference       Peak Flow       Pain Score       Pain Loc       Pain Edu?       Excl. in [place]?      GENERAL APPEARANCE: Awake and alert. Cooperative. No acute distress.  HEAD: Normocephalic. Atraumatic.  EYES: EOM's grossly intact. Sclera anicteric.  ENT: Mucous membranes are moist. Tolerates saliva. No trismus.  NECK: Supple. No meningismus. Trachea midline.  HEART: Irregularly irregular. Radial pulses 2+.  LUNGS: Respirations unlabored. CTAB  ABDOMEN: Soft. Non-tender. No guarding or rebound.  EXTREMITIES: No acute deformities.  SKIN: Warm and dry.  NEUROLOGICAL: No gross facial drooping. Moves all 4 extremities spontaneously.  PSYCHIATRIC: Normal mood.    I have reviewed and interpreted all of the currently available lab results from this visit (if applicable):  Results for orders placed or performed during the hospital encounter of 04/25/17   CBC Auto Differential   Result Value Ref Range    WBC 6.4 4.0 - 10.5 K/CU MM    RBC 2.10 (L) 4.2 - 5.4 M/CU MM    Hemoglobin 7.1 (L) 12.5 - 16.0 GM/DL    Hematocrit 22.1 (L) 37 - 47 %    MCV 105.2 (H) 78 - 100 FL    MCH 33.8 (H) 27 - 31 PG    MCHC 32.1 32.0 - 36.0 %    RDW 13.6 11.7 - 14.9 %    Platelets 159 140 - 440 K/CU MM    MPV 10.3 7.5 - 11.1 FL    Differential Type AUTOMATED DIFFERENTIAL     Segs Relative 77.4 (H) 36 - 66 %    Lymphocytes % 12.6 (L) 24 - 44 %    Monocytes % 7.8 (H) 0 - 4 %    Eosinophils % 1.7 0 - 3 %  Basophils % 0.3 0 - 1 %    Segs Absolute 5.0 K/CU MM    Lymphocytes # 0.8 K/CU MM    Monocytes # 0.5 K/CU MM    Eosinophils # 0.1 K/CU MM    Basophils # 0.0 K/CU MM    Immature Neutrophil % 0.2 0 - 0.43 %    Total Immature Neutrophil 0.01 K/CU MM   CMP   Result  Value Ref Range    Sodium 139 135 - 145 MMOL/L    Potassium 4.1 3.5 - 5.1 MMOL/L    Chloride 100 99 - 110 mMol/L    CO2 27 21 - 32 MMOL/L    BUN 47 (H) 6 - 23 MG/DL    CREATININE 2.1 (H) 0.6 - 1.1 MG/DL    Glucose 145 (H) 70 - 99 MG/DL    Calcium 8.6 8.3 - 10.6 MG/DL    Alb 3.7 3.4 - 5.0 GM/DL    Total Protein 6.2 (L) 6.4 - 8.2 GM/DL    Total Bilirubin 0.6 0.0 - 1.0 MG/DL    ALT 7 (L) 10 - 40 U/L    AST 25 15 - 37 IU/L    Alkaline Phosphatase 55 40 - 129 IU/L    GFR Non-African American 22 (L) >60 mL/min/1.27m2    GFR African American 27 (L) >60 mL/min/1.57m2    Anion Gap 12 4 - 16   Protime/INR & PTT   Result Value Ref Range    Protime 12.2 10 - 14.3 SECONDS    INR 1.07 INDEX    aPTT 49.3 (H) 24 - 40 SECONDS   EKG 12 Lead   Result Value Ref Range    Ventricular Rate 82 BPM    Atrial Rate 92 BPM    QRS Duration 98 ms    Q-T Interval 422 ms    QTc Calculation (Bazett) 493 ms    R Axis -31 degrees    T Axis 94 degrees    Diagnosis       Atrial fibrillation  Left axis deviation  Anteroseptal infarct (cited on or before 27-Apr-2015)  Abnormal ECG  When compared with ECG of 27-Apr-2015 21:08,  Atrial fibrillation has replaced Sinus rhythm  Questionable change in initial forces of Anterior leads  QT has lengthened        Radiographs (if obtained):  []  The following radiograph was interpreted by myself in the absence of a radiologist:  [x]  Radiologist's Report Reviewed:  Vl Dup Lower Extremity Venous Left    Result Date: 04/02/2017  EXAMINATION: DUPLEX VENOUS ULTRASOUND OF THE LEFT LOWER EXTREMITY 04/02/2017 9:46 am TECHNIQUE: Duplex ultrasound and Doppler images were obtained of the left lower extremity. COMPARISON: 11/13/2006 HISTORY: ORDERING SYSTEM PROVIDED HISTORY: Localized edema TECHNOLOGIST PROVIDED HISTORY: Ordering Physician Provided Reason for Exam: edema/pain Acuity: Acute FINDINGS: The visualized veins of the left lower extremity are patent and free of echogenic thrombus. The veins are normally compressible and  have normal phasic flow.     No evidence of DVT in the left lower extremity.       EKG (if obtained): (All EKG's are interpreted by myself in the absence of a cardiologist)    Patient with atrial fibrillation with a heart rate of 82.  There is nonspecific ST changes throughout.  QTC is 493. Previous EKG in sinus rhythm, 04/27/2015    MDM:  Patient does have a prior history of anemia.  She has received blood transfusions in the past.  Unclear etiology of her  anemia based on patient report but does have a history of chronic kidney dysfunction.  Today's blood work does demonstrate a hemoglobin of 7.1.  Rest of blood work was comparable to previous.  Her EKG didn't demonstrate atrial fibrillation which seems to be a new finding for her.  She did have some nonspecific ST changes that I did discuss with the cardiologist, Dr. Drema Pry her fatigue and weakness seemed to be chronic in nature.  She states this is slightly worse today however has been fasting and up all night as she was worried about her surgery.  She denies having any associated chest pain, lightheadedness, dyspnea on exertion.  I initially admitted her thinking that she would need to be admitted for blood transfusion as well as workup of her atrial fibrillation.  Dr. Evelena Leyden did come down and evaluate the patient.  While he was down here, she did revert back to sinus rhythm.  We were able to arrange for outpatient transfusion for her to occur on Monday.  Patient felt more comfortable with this plan.  She was encouraged to return at any point if symptoms change or worsen.  I do think this is a reasonable course of action for the patient.  Family at bedside was comfortable with this plan as well and also felt comfortable monitoring her symptoms at home.  She is appropriate for discharge at this time for a transfusion. .       Clinical Impression:  1. Anemia, unspecified type    2. Atrial fibrillation, unspecified type [facility])      (Please note that portions of  this note may have been completed with a voice recognition program. Efforts were made to edit the dictations but occasionally words are mis-transcribed.)    Electronically signed by Roney Mans, MD on 04/25/2017 at 12:04 PM           Roney Mans, MD  04/25/17 [place], MD  04/25/17 1210

## 2017-04-25 NOTE — ED Notes (Signed)
Outpatient order for blood transfusion per Dr. Veneta Penton with consents taken to registration and appt set up for 1000 on 04/28/2017 for transfusion.     Domenica Reamer, RN  04/25/17 [phone]

## 2017-04-25 NOTE — ED Notes (Signed)
Ria Comment, lab, had question as to whether pt was to get blood transfusion today. Informed her of the ED disposition.      [name], RN  04/28/17 1026

## 2017-04-25 NOTE — Consults (Signed)
Name:  Judy Velasquez DOB/Age/Sex: 1929-04-24  (83 y.o. female)   MRN & CSN:  [id number] & [id number] Admission Date/Time: 04/25/2017  9:56 AM   Location:  02/02 PCP: [name], MD       Hospital Day: 1  Assessment and Plan:     Atrial fibrillation: Patient is now reverted back to sinus rhythm, she is well rate controlled with a rate of about 75, does not apply she is in failure, but do not have an echocardiogram from her last was about 5 years ago.  She had a normal ejection fraction.      CHA2DS2-VASc Score for Atrial Fibrillation Stroke Risk   Risk   Factors  Component Value   C CHF No 0   H HTN Yes 1   A2 Age >= 73 Yes,  (83 y.o.) 2   D DM Yes 1   S2 Prior Stroke/TIA No 0   V Vascular Disease No 0   A Age 92-74 No,  (83 y.o.) 0   Sc Sex female 1    CHA2DS2-VASc  Score  5   Score last updated 04/25/17 12:44 PM      Disclaimer: Risk Score calculation is dependent on accuracy of patient problem list and past encounter diagnosis.    Patient's chads score is a 5 which puts her at a very high risk but given the fact that the patient is a significantly anemic and is in the middle of being investigated for possible GI losses we will not initiate her on any anticoagulation given that she is in paroxysmal atrial fibrillation.  Would rather start her on now beta blockers peripherally metoprolol was carvedilol depending on the ER physician's choice and patient is already on the Cardizem which she will continue and she should follow-up with her primary doctor and her cardiologist.  Next    Anemia: Likely anemia of chronic disease secondary to her kidney disease, patient has been at the state for a while, her last hemoglobin checked which was about a month ago was with 7.5, patient is not more symptomatic than usual, does not have palpitations does not have shortness of breath and no dizziness either.  We will arrange for her to get her transfusion either in the outpatient setting up on Monday or if possible today in  the same-day surgery.  Next    Chronic kidney disease: Creatinine is stable at 2.1 which is her baseline, her literature also normal and patient does not appear fluid up.    I did discuss at length this plan that the patient can go home with Abita Blocker and come back on Monday for the transfusion and patient expressed full understanding and so did the granddaughter was at bedside, I discussed the same plan with the ER physician Dr.Sharkey was agreeable with the plan          Diet  Renal    DVT Prophylaxis []  Lovenox, []  Heparin, []  SCDs, [x]  Ambulation   GI Prophylaxis []  PPI, []  H2 Blocker, []  Carafate, [x]  Diet/Tube Feeds   Code Status Prior   Disposition **Consultation and discharge*   MDM []  Low, []  Moderate,[x]  High  Patient's risk as above due to complexity of data reviewed and medical management options                  ROS    Head: no headaches  Ears: no fullness or hearing loss  Eyes: no visual problems  Oral cavity: no dryness or excessive  thirst  Neck: no fullness, stiffness  CVS: no chest wall tenderness, no CP, or palpitations  Respi: No SOB, Cough  GI: no nausea, vomiting,diarrhea  GU: no frequency or urgency  CNS: no focal weakness or incoordination  Skin: no rash, itching or bruising  MS: no joint weakness or muscle pain      Objective:   No intake or output data in the 24 hours ending 04/25/17 1249   Vitals:   Vitals:    04/25/17 0928   BP: (!) 175/73   Pulse: 77   Resp: 16   Temp: 99 F (37.2 C)   SpO2: 95%     Physical Exam:         Head: atraumatic & normocephalic  Ears: TM's bialterall normal with normal canals  Eyes: without pallor or icterus  Oral cavity: moist mucous membranes with normal dentition  Neck: supple with no lymphadenopathy, JVD down, trachea central  CVS: normal S1S2 with no additional heart sounds, No lower extremity edema  Respi: good air entry in both lung fields with no other adventious breath sounds  Abdo: soft & non tender, with +ve bowel sounds, no guarding ,rigidity or  rebound tenderness  GU: no inguinal lymphadenopathy, no CVA tenderness  CNS: no focal weakness or sensory deficits peripherally, DTR's equal bilaterally, CN2-12 normal  Skin: no rash, purpurea or eruptions      Recent Labs      04/25/17   0945   WBC  6.4   HGB  7.1*   HCT  22.1*   PLT  159      Recent Labs      04/25/17   0945   NA  139   K  4.1   CL  100   CO2  27   BUN  47*   CREATININE  2.1*     Recent Labs      04/25/17   0945   AST  25   ALT  7*   BILITOT  0.6   ALKPHOS  55             Electronically signed by Kandice Moos, MD on 04/25/2017 at 11:54 AM      Comment: Please note this report has been produced using speech recognition software and may contain errors related to that system including errors in grammar, punctuation, and spelling, as well as words and phrases that may be inappropriate. If there are any questions or concerns please feel free to contact the dictating provider for clarification

## 2017-04-25 NOTE — ED Notes (Signed)
Dr. Drema Pry called and spoke with Dr. Arvilla Market, RN  04/25/17 1026

## 2017-04-25 NOTE — ED Notes (Signed)
Dr. Veneta Penton request pt cardiologist Dr. Bernadene Person be consulted, called office, told Dr. Bernadene Person out of town and to call on call cardiologist.  Paged cardiology.     Domenica Reamer, RN  04/25/17 1025

## 2017-04-25 NOTE — ED Notes (Signed)
Dr. Evelena Leyden here to see pt.     Domenica Reamer, RN  04/25/17 [phone]

## 2017-04-25 NOTE — ED Notes (Signed)
Pt complaining of leg pain d/t lying in bed, pillow under knees, pt states continued pain to legs, pt assisted to chair, cardiac monitor remains on, granddaughter at bedside.     Domenica Reamer, RN  04/25/17 1239

## 2017-04-28 ENCOUNTER — Encounter: Primary: Internal Medicine

## 2017-04-28 LAB — EKG 12-LEAD
Atrial Rate: 92 {beats}/min
Diagnosis: NORMAL
Q-T Interval: 422 ms
QRS Duration: 98 ms
QTc Calculation (Bazett): 493 ms
R Axis: -31 degrees
T Axis: 94 degrees
Ventricular Rate: 82 {beats}/min

## 2017-04-28 MED ORDER — SODIUM CHLORIDE 0.9 % IV BOLUS
0.9 % | Freq: Once | INTRAVENOUS | Status: DC
Start: 2017-04-28 — End: 2017-04-28
  Administered 2017-04-28: 15:00:00 250 mL via INTRAVENOUS

## 2017-04-28 MED FILL — SODIUM CHLORIDE 0.9 % IV SOLN: 0.9 % | INTRAVENOUS | Qty: 250

## 2017-04-28 NOTE — Other (Signed)
Patient Acct Nbr:  [id number]  Primary AUTH/CERT:    [place] Name:   Holland Falling TRAD/OPEN [place]  Primary Insurance Plan Name:  [employer]  Primary Insurance Group Number:  [id number]  Primary Insurance Plan Type: N  Primary Insurance Policy Number:  [id number]

## 2017-05-05 LAB — COMPREHENSIVE METABOLIC PANEL
ALT: 7 U/L — ABNORMAL LOW (ref 10–40)
AST: 16 IU/L (ref 15–37)
Albumin: 3.5 GM/DL (ref 3.4–5.0)
Alkaline Phosphatase: 57 IU/L (ref 40–129)
Anion Gap: 16 (ref 4–16)
BUN: 31 MG/DL — ABNORMAL HIGH (ref 6–23)
CO2: 25 MMOL/L (ref 21–32)
Calcium: 8.9 MG/DL (ref 8.3–10.6)
Chloride: 99 mMol/L (ref 99–110)
Creatinine: 1.9 MG/DL — ABNORMAL HIGH (ref 0.6–1.1)
GFR African American: 30 mL/min/{1.73_m2} — ABNORMAL LOW (ref >60)
GFR Non-African American: 25 mL/min/{1.73_m2} — ABNORMAL LOW (ref >60)
Glucose: 155 MG/DL — ABNORMAL HIGH (ref 70–99)
Potassium: 4.1 MMOL/L (ref 3.5–5.1)
Sodium: 140 MMOL/L (ref 135–145)
Total Bilirubin: 0.6 MG/DL (ref 0.0–1.0)
Total Protein: 6.1 GM/DL — ABNORMAL LOW (ref 6.4–8.2)

## 2017-05-05 LAB — CBC WITH AUTO DIFFERENTIAL
Basophils %: 0.3 % (ref 0–1)
Basophils Absolute: 0 10*3/uL
Eosinophils %: 2 % (ref 0–3)
Eosinophils Absolute: 0.1 10*3/uL
Hematocrit: 30 % — ABNORMAL LOW (ref 37–47)
Hemoglobin: 9.8 GM/DL — ABNORMAL LOW (ref 12.5–16.0)
Immature Neutrophil %: 0.5 % — ABNORMAL HIGH (ref 0–0.43)
Lymphocytes %: 16.3 % — ABNORMAL LOW (ref 24–44)
Lymphocytes Absolute: 1 10*3/uL
MCH: 31.9 PG — ABNORMAL HIGH (ref 27–31)
MCHC: 32.7 % (ref 32.0–36.0)
MCV: 97.7 FL (ref 78–100)
MPV: 9.2 FL (ref 7.5–11.1)
Monocytes %: 7.8 % — ABNORMAL HIGH (ref 0–4)
Monocytes Absolute: 0.5 10*3/uL
Platelets: 220 10*3/uL (ref 140–440)
RBC: 3.07 10*6/uL — ABNORMAL LOW (ref 4.2–5.4)
RDW: 15.6 % — ABNORMAL HIGH (ref 11.7–14.9)
Segs Absolute: 4.4 10*3/uL
Segs Relative: 73.1 % — ABNORMAL HIGH (ref 36–66)
Total Immature Neutrophil: 0.03 10*3/uL
WBC: 6 10*3/uL (ref 4.0–10.5)

## 2017-05-08 ENCOUNTER — Encounter

## 2017-05-13 NOTE — Progress Notes (Signed)
Judy Velasquez has a pain level on 0/10 scale  0    Location breasts    Description none    Radiation   No    Duration  1 year(s)    Time  none

## 2017-05-13 NOTE — Progress Notes (Signed)
Judy Velasquez     Judy Velasquez is here for cancer of the right Breast. DCIS  Procedure: lumpectomy Date: 03/2011   Hormonal Therapy:yes, anastrazole  Chemotherapy: no  Radiation Therapy: done    Current Evaluation:   Today the patient is basically feeling well. She denies new breast mass, nipple discharge, or breast pain. No new subcutaneous mass, headache, bone pain, new cough, or abdominal pain.     Physical Exam:   Generally healthy appearing, Caucasian female   Skin: no new subcutaneous mass   Breast: non-tender, no new mass   Lungs clear   Heart RRR  Lymphadenopathy: no new axillary and supraclavicular   Abdomen: non-tender without liver, spleen, kidney, or mass palpable     Mammography:   Last mammogram: 04/2017   Personally reviewed by me. Mammogram is unchanged from previous mammograms. Radiologist report is negative     Impression:   No evidence of systemic or recurrent breast cancer:   No evidence of second primary breast cancer     Plan: RTC 1 year  Will need a mammogram in a year  Recommend 2 Tums twice a day for her osteoporosis.

## 2017-06-18 ENCOUNTER — Encounter: Primary: Internal Medicine

## 2017-06-18 ENCOUNTER — Inpatient Hospital Stay: Payer: MEDICARE | Primary: Internal Medicine

## 2017-06-18 DIAGNOSIS — Z0183 Encounter for blood typing: Secondary | ICD-10-CM

## 2017-06-19 ENCOUNTER — Inpatient Hospital Stay: Admit: 2017-06-19 | Payer: MEDICARE | Primary: Internal Medicine

## 2017-06-19 ENCOUNTER — Inpatient Hospital Stay
Admit: 2017-06-19 | Discharge: 2017-06-20 | Disposition: A | Discharge status: Home / Self Care | Payer: MEDICARE | Attending: Emergency Medicine

## 2017-06-19 DIAGNOSIS — I10 Essential (primary) hypertension: Secondary | ICD-10-CM

## 2017-06-19 MED ORDER — SODIUM CHLORIDE 0.9 % IV BOLUS
0.9 % | Freq: Once | INTRAVENOUS | Status: AC
Start: 2017-06-19 — End: 2017-06-19
  Administered 2017-06-19: 12:00:00 250 mL via INTRAVENOUS

## 2017-06-19 MED ORDER — FUROSEMIDE 10 MG/ML IJ SOLN
10 MG/ML | Freq: Once | INTRAMUSCULAR | Status: AC
Start: 2017-06-19 — End: 2017-06-19
  Administered 2017-06-19: 16:00:00 40 mg via INTRAVENOUS

## 2017-06-19 MED ORDER — HYDRALAZINE HCL 50 MG PO TABS
50 MG | Freq: Every day | ORAL | Status: DC
Start: 2017-06-19 — End: 2017-06-20
  Administered 2017-06-19: 19:00:00 50 mg via ORAL

## 2017-06-19 MED ORDER — FUROSEMIDE 10 MG/ML IJ SOLN
10 MG/ML | Freq: Once | INTRAMUSCULAR | Status: AC
Start: 2017-06-19 — End: 2017-06-19
  Administered 2017-06-19: 18:00:00 40 mg via INTRAVENOUS

## 2017-06-19 MED FILL — HYDRALAZINE HCL 50 MG PO TABS: 50 MG | ORAL | Qty: 1

## 2017-06-19 MED FILL — FUROSEMIDE 10 MG/ML IJ SOLN: 10 MG/ML | INTRAMUSCULAR | Qty: 4

## 2017-06-19 MED FILL — SODIUM CHLORIDE 0.9 % IV SOLN: 0.9 % | INTRAVENOUS | Qty: 250
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
## 2017-06-19 NOTE — ED Notes (Signed)
Report received and care assumed from [facility].      Anderson Malta, RN  06/19/17 1901

## 2017-06-19 NOTE — ED Notes (Signed)
Called the access center to have Dr. Maretta Bees office paged for consult      Judy Velasquez  06/19/17 1933
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
## 2017-06-19 NOTE — ED Notes (Signed)
Dr. Karyl Kinnier called back for consult     Judy Velasquez  06/19/17 2008

## 2017-06-19 NOTE — Progress Notes (Signed)
Pt alert . No Complaints Lung fields CTA

## 2017-06-19 NOTE — Progress Notes (Signed)
Called Dr Sonnie Alamo about Pt's BP Could send her home and she is to take BP reading at home

## 2017-06-20 MED ORDER — HYDRALAZINE HCL 50 MG PO TABS
50 MG | ORAL_TABLET | Freq: Three times a day (TID) | ORAL | 0 refills | Status: DC
Start: 2017-06-20 — End: 2017-09-19

## 2017-08-18 ENCOUNTER — Emergency Department: Admit: 2017-08-18 | Payer: MEDICARE | Primary: Internal Medicine

## 2017-08-18 ENCOUNTER — Inpatient Hospital Stay
Admit: 2017-08-18 | Discharge: 2017-08-18 | Disposition: A | Discharge status: Other Acute Inpatient Hospital | Payer: MEDICARE | Attending: Emergency Medicine

## 2017-08-18 DIAGNOSIS — S0990XA Unspecified injury of head, initial encounter: Secondary | ICD-10-CM

## 2017-08-18 LAB — CBC WITH AUTO DIFFERENTIAL
Basophils %: 0.1 % (ref 0–1)
Basophils Absolute: 0 10*3/uL
Eosinophils %: 0.4 % (ref 0–3)
Eosinophils Absolute: 0 10*3/uL
Hematocrit: 25.5 % — ABNORMAL LOW (ref 37–47)
Hemoglobin: 8.4 GM/DL — ABNORMAL LOW (ref 12.5–16.0)
Immature Neutrophil %: 0.5 % — ABNORMAL HIGH (ref 0–0.43)
Lymphocytes %: 11.1 % — ABNORMAL LOW (ref 24–44)
Lymphocytes Absolute: 1 10*3/uL
MCH: 34 PG — ABNORMAL HIGH (ref 27–31)
MCHC: 32.9 % (ref 32.0–36.0)
MCV: 103.2 FL — ABNORMAL HIGH (ref 78–100)
MPV: 9.7 FL (ref 7.5–11.1)
Monocytes %: 7.7 % — ABNORMAL HIGH (ref 0–4)
Monocytes Absolute: 0.7 10*3/uL
Platelets: 181 10*3/uL (ref 140–440)
RBC: 2.47 10*6/uL — ABNORMAL LOW (ref 4.2–5.4)
RDW: 16 % — ABNORMAL HIGH (ref 11.7–14.9)
Segs Absolute: 7.3 10*3/uL
Segs Relative: 80.2 % — ABNORMAL HIGH (ref 36–66)
Total Immature Neutrophil: 0.05 10*3/uL
WBC: 9.1 10*3/uL (ref 4.0–10.5)

## 2017-08-18 LAB — BRAIN NATRIURETIC PEPTIDE: Pro-BNP: >35000 PG/ML — ABNORMAL HIGH (ref <300)

## 2017-08-18 LAB — COMPREHENSIVE METABOLIC PANEL
ALT: 6 U/L — ABNORMAL LOW (ref 10–40)
AST: 18 IU/L (ref 15–37)
Albumin: 3.3 GM/DL — ABNORMAL LOW (ref 3.4–5.0)
Alkaline Phosphatase: 64 IU/L (ref 40–129)
Anion Gap: 13 (ref 4–16)
BUN: 44 MG/DL — ABNORMAL HIGH (ref 6–23)
CO2: 20 MMOL/L — ABNORMAL LOW (ref 21–32)
Calcium: 8.6 MG/DL (ref 8.3–10.6)
Chloride: 102 mMol/L (ref 99–110)
Creatinine: 2.7 MG/DL — ABNORMAL HIGH (ref 0.6–1.1)
GFR African American: 20 mL/min/{1.73_m2} — ABNORMAL LOW (ref >60)
GFR Non-African American: 17 mL/min/{1.73_m2} — ABNORMAL LOW (ref >60)
Glucose: 139 MG/DL — ABNORMAL HIGH (ref 70–99)
Potassium: 3.9 MMOL/L (ref 3.5–5.1)
Sodium: 135 MMOL/L (ref 135–145)
Total Bilirubin: 0.5 MG/DL (ref 0.0–1.0)
Total Protein: 6.3 GM/DL — ABNORMAL LOW (ref 6.4–8.2)

## 2017-08-18 MED ORDER — ONDANSETRON HCL 4 MG/2ML IJ SOLN
4 MG/2ML | INTRAMUSCULAR | Status: AC
Start: 2017-08-18 — End: 2017-08-18
  Administered 2017-08-18: 20:00:00 4 via INTRAVENOUS

## 2017-08-18 MED ORDER — SODIUM CHLORIDE 0.9 % IV BOLUS
0.9 % | Freq: Once | INTRAVENOUS | Status: AC
Start: 2017-08-18 — End: 2017-08-18
  Administered 2017-08-18: 18:00:00 500 mL via INTRAVENOUS

## 2017-08-18 MED ORDER — ONDANSETRON HCL 4 MG/2ML IJ SOLN
4 MG/2ML | Freq: Once | INTRAMUSCULAR | Status: AC
Start: 2017-08-18 — End: 2017-08-18
  Administered 2017-08-18: 18:00:00 4 mg via INTRAVENOUS

## 2017-08-18 MED ORDER — SODIUM CHLORIDE 0.9 % IV SOLN
0.9 % | INTRAVENOUS | Status: DC
Start: 2017-08-18 — End: 2017-08-18
  Administered 2017-08-18: 18:00:00 via INTRAVENOUS

## 2017-08-18 MED ORDER — MORPHINE SULFATE (PF) 2 MG/ML IV SOLN
2 MG/ML | Freq: Once | INTRAVENOUS | Status: AC
Start: 2017-08-18 — End: 2017-08-18
  Administered 2017-08-18: 18:00:00 2 mg via INTRAVENOUS

## 2017-08-18 MED FILL — ONDANSETRON HCL 4 MG/2ML IJ SOLN: 4 MG/2ML | INTRAMUSCULAR | Qty: 2

## 2017-08-18 MED FILL — SODIUM CHLORIDE 0.9 % IV SOLN: 0.9 % | INTRAVENOUS | Qty: 1000

## 2017-08-18 MED FILL — SODIUM CHLORIDE 0.9 % IV SOLN: 0.9 % | INTRAVENOUS | Qty: 500

## 2017-08-18 MED FILL — MORPHINE SULFATE (PF) 2 MG/ML IV SOLN: 2 mg/mL | INTRAVENOUS | Qty: 1

## 2017-08-18 NOTE — ED Notes (Signed)
Patient has been accepted at [facility]. Nevin Bloodgood call with report number of [phone].     Lisette Abu  08/18/17 1341

## 2017-08-18 NOTE — ED Provider Notes (Addendum)
Triage Chief Complaint:   Fall (pt fell backwards on bottom this am, knot to back of head, denies LOC or blood thinners)    HOPI:  Judy Velasquez is a 83 y.o. female that presents to the ED with a family member complaining of pain in her head and her back after she fell.  Patient tells and she's been falling a lot.  She is an 83 year old female who is on no antiplatelet or Antithrombin inhibitor.  She states she gets unsteady a lot and falls she fell this morning hit the back of her head.  No witnessed seizure.  She is complaining of a headache.  She has some mild nausea.  No cervical neck pain.  She's also complained of severe discomfort in her buttock/sacral region.  She is able to stand upright at home and stand up out of the wheelchair in the ED.  She denies any antecedent symptoms such as lightheaded dizziness or vertigo chest pain cough shortness of breath.    Past Medical History:   Diagnosis Date   . Allergic rhinitis    . Anemia    . Anesthesia     Nausea/Vomiting Post Op In Past   . Anxiety    . Arthritis     s/p bilateral hip and right knee replacement sees Dr. Chriss Czar   . Back problem     "Occ Back Hurts"   . Blood transfusion    . CAD (coronary artery disease) 2008    Sees Dr. Brandt Loosen CABG x 3   . DCIS (ductal carcinoma in situ) 05/20/2011   . Glaucoma     Bilateral Eyes   . H/O cardiovascular stress test 05/20/11   . Heart attack ([place]) 2006   . HOH (hard of hearing)     Bilateral Ears   . Hyperlipidemia    . Hypertension    . IBS (irritable bowel syndrome)    . Kidney problem     "Sees  Dr. Maretta Bees For Blood Pressure And Kidneys"   . Nausea & vomiting     Nausea/Vomiting Post Op In Past   . Osteoarthritis    . Osteoporosis    . Right Breast Cancer Dx 7-12   . Shortness of breath on exertion    . Staph infection 1980's    "They sent me to a tropical infection doctor"   . TB (tuberculosis)     "Probably had it as a child, my dad had it"   . Type II or unspecified type diabetes mellitus without mention of  complication, not stated as uncontrolled Dx 1980's     Past Surgical History:   Procedure Laterality Date   . BREAST LUMPECTOMY  05/20/2011    Right   . BREAST SURGERY  7-12    Right Breast Biopsy   . CARDIAC SURGERY  2008    CABG (3 Bypasses)   . CARPAL TUNNEL RELEASE  2000's    Right   . CESAREAN SECTION  "Late 1950's or Early 1960's"    X 3   . CHOLECYSTECTOMY, LAPAROSCOPIC  1990's   . COLONOSCOPY  2000's   . CORONARY ARTERY BYPASS GRAFT  2008    cabg x 3   . ENDOSCOPY, COLON, DIAGNOSTIC  1990's   . HYSTERECTOMY, TOTAL ABDOMINAL  1960's   . JOINT REPLACEMENT  1980's    Total Right Hip   . JOINT REPLACEMENT  2000's    Total Right Hip   . JOINT REPLACEMENT  1990's  Total Left Hip   . JOINT REPLACEMENT  1980's    Total Right Knee   . OTHER SURGICAL HISTORY      Family Physician Is Dr. Evelina Bucy In [place], [place]   . OTHER SURGICAL HISTORY  2000's    "Took section of my intestines out, it was a exploratory operation, they found scar tissue from the c-sections"   . TONSILLECTOMY AND ADENOIDECTOMY  1939     Family History   Problem Relation Age of Onset   . Heart Disease Mother    . Cancer Mother         "Cancer In Her Eye"   . Depression Mother    . Mental Illness Mother    . Diabetes Father    . Heart Disease Father         "Heart Attack"   . Hearing Loss Father    . Cancer Sister         "Breast Cancer"   . Early Death Brother         "At [employer]   . Diabetes Sister    . Vision Loss Son         "Eye Problems"   . Other Son         "Alot of health problems due to motorcycle wreck"   . Arthritis Daughter    . Early Death Son         "Early 87's"     Social History     Social History   . Marital status: Widowed     Spouse name: N/A   . Number of children: N/A   . Years of education: N/A     Occupational History   . Not on file.     Social History Main Topics   . Smoking status: Never Smoker   . Smokeless tobacco: Never Used   . Alcohol use No   . Drug use: No   . Sexual activity: No     Other Topics Concern   . Not  on file     Social History Narrative    Do you donate blood or plasma? No    Caffeine intake? Moderate    Advance directive? No    Is blood transfusion acceptable in an emergency? Yes    Live alone or with others? With others    Sunscreen used routinely? No able to care for self? Yes             Current Facility-Administered Medications   Medication Dose Route Frequency Provider Last Rate Last Dose   . 0.9 % sodium chloride infusion   Intravenous Continuous Rosalita Levan, DO 60 mL/hr at 08/18/17 1255       Current Outpatient Prescriptions   Medication Sig Dispense Refill   . hydrALAZINE (APRESOLINE) 50 MG tablet Take 1 tablet by mouth 3 times daily One and one half Tablets Twice Daily 60 tablet 0   . iron polysaccharides (FERREX 150) 150 MG capsule Take 150 mg by mouth 2 times daily     . naphazoline-pheniramine (NAPHCON-A) 0.025-0.3 % ophthalmic solution 1 drop 4 times daily     . B Complex-Biotin-FA (SUPER B-50 B COMPLEX PO) Take by mouth     . fluticasone (FLONASE) 50 MCG/ACT nasal spray 1 spray by Nasal route daily     . anastrozole (ARIMIDEX) 1 MG tablet Take 1 tablet by mouth daily 30 tablet 11   . diltiazem (TIAZAC) 240 MG SR capsule Take 240 mg  by mouth every evening.       . furosemide (LASIX) 40 MG tablet Take 40 mg by mouth daily      . carvedilol (COREG) 6.25 MG tablet Take 3.125 mg by mouth 2 times daily        Allergies   Allergen Reactions   . Celebrex [Celecoxib] Shortness Of Breath, Swelling and Anaphylaxis     "Swelling Of The Esophagus"   . Fish Allergy Anaphylaxis   . Lisinopril Other (See Comments)     Dry Mouth   . Neurontin [Gabapentin] Other (See Comments)     Dizziness   . Norvasc [Amlodipine Besylate] Other (See Comments)     Edema Of Legs   . Other      "Allergic To Certain Seafoods Causing Headaches, Difficulty Breathing"   . Shellfish Allergy Anaphylaxis   . Ciprofloxacin Rash   . Lipitor Other (See Comments)     Ache   . Losartan      Per nephrologist         ROS:    Review of Systems    Constitutional: Positive for fatigue.   HENT: Negative.    Respiratory: Negative for shortness of breath.    Cardiovascular: Negative.    Gastrointestinal: Negative.    Musculoskeletal: Positive for back pain.   Neurological: Positive for headaches.   All other systems reviewed and are negative.      Nursing Notes Reviewed    Physical Exam:  ED Triage Vitals   Enc Vitals Group      BP       Pulse       Resp       Temp       Temp src       SpO2       Weight       Height       Head Circumference       Peak Flow       Pain Score       Pain Loc       Pain Edu?       Excl. in [place]?        Physical Exam   Constitutional: She is oriented to person, place, and time. She appears well-developed and well-nourished. She appears cachectic.  Non-toxic appearance. She has a sickly appearance. She appears ill. No distress.   HENT:   Head: Normocephalic. Head is with contusion.       Right Ear: External ear normal.   Left Ear: External ear normal.   Nose: Nose normal.   Mouth/Throat: Oropharynx is clear and moist.   Eyes: Pupils are equal, round, and reactive to light.   Neck: Normal range of motion. Neck supple. No JVD present.   Cardiovascular: Normal rate, regular rhythm and normal heart sounds.    Pulmonary/Chest: Effort normal and breath sounds normal.   Abdominal: Soft. Bowel sounds are normal. She exhibits no distension and no mass. There is no tenderness. There is no guarding.   Musculoskeletal: Normal range of motion.        Cervical back: Normal.        Thoracic back: Normal.        Lumbar back: She exhibits bony tenderness.        Back:    Neurological: She is alert and oriented to person, place, and time. She has normal strength. No sensory deficit. GCS eye subscore is 4. GCS verbal subscore is 5. GCS motor subscore is 6.  Skin: Skin is warm and dry.   Psychiatric: She has a normal mood and affect.   Nursing note and vitals reviewed.      I have reviewed and interpreted all of the currently available lab results from  this visit (ifapplicable):  Results for orders placed or performed during the hospital encounter of 08/18/17   Brain Natriuretic Peptide   Result Value Ref Range    Pro-BNP >35000 (H) <300 PG/ML   CBC auto diff   Result Value Ref Range    WBC 9.1 4.0 - 10.5 K/CU MM    RBC 2.47 (L) 4.2 - 5.4 M/CU MM    Hemoglobin 8.4 (L) 12.5 - 16.0 GM/DL    Hematocrit 25.5 (L) 37 - 47 %    MCV 103.2 (H) 78 - 100 FL    MCH 34.0 (H) 27 - 31 PG    MCHC 32.9 32.0 - 36.0 %    RDW 16.0 (H) 11.7 - 14.9 %    Platelets 181 140 - 440 K/CU MM    MPV 9.7 7.5 - 11.1 FL    Differential Type AUTOMATED DIFFERENTIAL     Segs Relative 80.2 (H) 36 - 66 %    Lymphocytes % 11.1 (L) 24 - 44 %    Monocytes % 7.7 (H) 0 - 4 %    Eosinophils % 0.4 0 - 3 %    Basophils % 0.1 0 - 1 %    Segs Absolute 7.3 K/CU MM    Lymphocytes # 1.0 K/CU MM    Monocytes # 0.7 K/CU MM    Eosinophils # 0.0 K/CU MM    Basophils # 0.0 K/CU MM    Immature Neutrophil % 0.5 (H) 0 - 0.43 %    Total Immature Neutrophil 0.05 K/CU MM   CMP   Result Value Ref Range    Sodium 135 135 - 145 MMOL/L    Potassium 3.9 3.5 - 5.1 MMOL/L    Chloride 102 99 - 110 mMol/L    CO2 20 (L) 21 - 32 MMOL/L    BUN 44 (H) 6 - 23 MG/DL    CREATININE 2.7 (H) 0.6 - 1.1 MG/DL    Glucose 139 (H) 70 - 99 MG/DL    Calcium 8.6 8.3 - 10.6 MG/DL    Alb 3.3 (L) 3.4 - 5.0 GM/DL    Total Protein 6.3 (L) 6.4 - 8.2 GM/DL    Total Bilirubin 0.5 0.0 - 1.0 MG/DL    ALT 6 (L) 10 - 40 U/L    AST 18 15 - 37 IU/L    Alkaline Phosphatase 64 40 - 129 IU/L    GFR Non-African American 17 (L) >60 mL/min/1.56m2    GFR African American 20 (L) >60 mL/min/1.21m2    Anion Gap 13 4 - 16   EKG 12 Lead   Result Value Ref Range    Ventricular Rate 73 BPM    Atrial Rate 73 BPM    P-R Interval 280 ms    QRS Duration 96 ms    Q-T Interval 434 ms    QTc Calculation (Bazett) 478 ms    P Axis 71 degrees    R Axis -35 degrees    T Axis 76 degrees    Diagnosis       Sinus rhythm with 1st degree AV block  Left axis deviation  Septal infarct (cited on  or before 27-Apr-2015)  Abnormal ECG  When compared with ECG of 25-Apr-2017 10:00,  PR interval has increased  Questionable change in initial forces of Anterior leads  ST no longer elevated in Anterior leads  T wave amplitude has increased in Anterior leads        Radiographs (if obtained):  []  The following radiograph wasinterpreted by myself in the absence of a radiologist:   []  Radiologist's Report Reviewed:  XR SACRUM COCCYX (MIN 2 VIEWS)   Final Result   No evidence of acute displaced sacrococcygeal fracture.      Osteopenia.         VL DUP LOWER EXTREMITY VENOUS LEFT   Final Result   No evidence of DVT in the left lower extremity.         XR CHEST STANDARD (2 VW)   Preliminary Result   1. Small left pleural effusion.  Left basilar atelectasis/infiltrate.   2. New nodular opacity in the right lung apex.  Chest CT recommended for   further evaluation.         CT HEAD WO CONTRAST   Final Result   1. No acute intracranial abnormality.      2. Moderate occipital scalp hematoma.  No underlying calvarial fracture.      3. Chronic small vessel ischemic white matter disease and diffuse cerebral   volume loss.               EKG (if obtained): (All EKG's are interpreted by myself in the absence of a cardiologist)    The 12 lead EKG was interpreted by me, and the interpretation is as follows:  normal sinus rhythm, rate = 73.  Intervals are within the normal range.First degree AVB  QTc is not prolonged.  ST elevations are not present.  T wave inversions are not present.  Non-specific T wave changes are not present.  Delta waves, Brugada Syndrome, and Short PR are not present.  There is no acute ischemia.  Q waves: v1, V2, V3  Axis - 35    Chart review shows recent radiographs:  No results found.    MDM:    Patient presents to the ED after having numerous falls.  She is not to be dehydrated with an acute kidney injury.  She will undergo a fluid bolus.  I was able to determine the patient scheduled for an outpatient packed  red blood cell transfusion at the [facility] on Wednesday.  Advanced imaging of her brain fortunately reveals no intracranial hemorrhage.  Sacral x-rays are negative.  Duplex ultrasound of left leg because of the swelling and tenderness forcefully is negative.  Chest x-ray does reveal a new right upper lobe nodule that will need further investigation I informed that to the hospitalist      Accepted by Dr. Lyndel Safe, [facility] at 1:29am      Clinical Impression:  1. Fall, initial encounter    2. AKI (acute kidney injury) ([place])    3. Pulmonary nodule    4. Dehydration    5. Anemia, unspecified type    6. Closed head injury, initial encounter      Disposition referral (if applicable):  No follow-up provider specified.  Disposition medications (if applicable):  New Prescriptions    No medications on file           Kyvon Hu A. Shavon Ashmore, DO, FACEP      Comment: Please note this report has been produced using speech recognition software and maycontain errors related to that system including errors in grammar, punctuation, and spelling, as well as words  and phrases that may be inappropriate. If there are any questions or concerns please feel free to contact thedictating provider for clarification.          Rosalita Levan, DO  08/18/17 1416

## 2017-08-18 NOTE — ED Notes (Signed)
I called the [facility] and spoke with [place]. She will page the [facility] regarding admission for this patient.     Lisette Abu  08/18/17 1310

## 2017-08-18 NOTE — ED Notes (Signed)
Pt up to RR with assist then on to radiology dept via cart with Missy, tech.     Emeline Darling, RN  08/18/17 [phone]

## 2017-08-21 LAB — EKG 12-LEAD
Atrial Rate: 73 {beats}/min
P Axis: 71 degrees
P-R Interval: 280 ms
Q-T Interval: 434 ms
QRS Duration: 96 ms
QTc Calculation (Bazett): 478 ms
R Axis: -35 degrees
T Axis: 76 degrees
Ventricular Rate: 73 {beats}/min

## 2017-08-30 ENCOUNTER — Other Ambulatory Visit: Payer: Self-pay | Admitting: Nurse Practitioner

## 2017-09-15 ENCOUNTER — Inpatient Hospital Stay: Admit: 2017-09-15 | Discharge: 2017-09-16 | Payer: MEDICARE | Attending: Emergency Medicine

## 2017-09-15 ENCOUNTER — Emergency Department: Admit: 2017-09-16 | Payer: MEDICARE | Primary: Internal Medicine

## 2017-09-15 DIAGNOSIS — R112 Nausea with vomiting, unspecified: Secondary | ICD-10-CM

## 2017-09-15 MED ORDER — SODIUM CHLORIDE 0.9 % IV BOLUS
0.9 % | Freq: Once | INTRAVENOUS | Status: AC
Start: 2017-09-15 — End: 2017-09-16
  Administered 2017-09-15: 1000 mL via INTRAVENOUS

## 2017-09-15 MED ORDER — NORMAL SALINE FLUSH 0.9 % IV SOLN
0.9 % | INTRAVENOUS | Status: DC | PRN
Start: 2017-09-15 — End: 2017-09-16
  Administered 2017-09-15: 10 mL via INTRAVENOUS

## 2017-09-15 MED ORDER — ONDANSETRON HCL 4 MG/2ML IJ SOLN
4 MG/2ML | INTRAMUSCULAR | Status: DC | PRN
Start: 2017-09-15 — End: 2017-09-16
  Administered 2017-09-15 – 2017-09-16 (×2): 4 mg via INTRAVENOUS

## 2017-09-15 MED FILL — SODIUM CHLORIDE 0.9 % IV SOLN: 0.9 % | INTRAVENOUS | Qty: 1000

## 2017-09-15 MED FILL — ONDANSETRON HCL 4 MG/2ML IJ SOLN: 4 MG/2ML | INTRAMUSCULAR | Qty: 2

## 2017-09-15 NOTE — ED Notes (Signed)
[facility] med transd will be here at [place], [place]  09/15/17 2214

## 2017-09-15 NOTE — ED Notes (Signed)
Fem cath done and sample obtained. Sample to lab.     [name], RN  09/15/17 2105

## 2017-09-15 NOTE — ED Notes (Signed)
Report to [employer].     [name], RN  09/15/17 [phone]

## 2017-09-15 NOTE — ED Notes (Signed)
Pt states that nausea has subsided.      [name], RN  09/15/17 2138

## 2017-09-15 NOTE — ED Triage Notes (Signed)
Here per [facility]

## 2017-09-15 NOTE — ED Notes (Signed)
Granddaughter Judy Velasquez [phone].       [name], RN  09/15/17 [phone]

## 2017-09-15 NOTE — ED Notes (Signed)
Medicated for nausea with Zofran IV.      [name], RN  09/15/17 2116

## 2017-09-15 NOTE — ED Notes (Signed)
[facility] called with room assignment of 4115. Report number [id number]. [employer] making transport arrangements.      [name], RN  09/15/17 2201

## 2017-09-15 NOTE — ED Notes (Signed)
[facility] Hospitalist paged via [employer].      [name], RN  09/15/17 2055

## 2017-09-15 NOTE — ED Notes (Signed)
Report to [place] transport crew.     [name], RN  09/15/17 [phone]

## 2017-09-15 NOTE — ED Provider Notes (Addendum)
[place]      TRIAGE CHIEF COMPLAINT:   Emesis (States has not felt well since the end of November when she was in the hospital. States has not felt well for a couple days. States has vomited 6 or 7 times today. Denies fever or chills. States just doesn't feel well. Denies diarrhea. Last BM 3 days ago. )      HOPI:  Judy Velasquez is a 83 y.o. female that presents with complaint abdominal pain nausea vomiting constipation.  Patient states she's felt well since November but has not worse past couple days.  Patient states she has vomited 7 times today she's not had a bowel movement in 3 days.  Is not passing flatus.  She's had nausea vomiting distention of her abdomen and pain.  Denies any fevers chest pain no shortness of breath.  Denies any other complaints.  She did not take any medicine at home to help this.  She has nausea medicine at home and Tylenol did not take it.    REVIEW OF SYSTEMS:  At least 10 systems reviewed and otherwise acutely negative except as in the [place].    Review of Systems   Constitutional: Positive for appetite change and fatigue.   HENT: Negative.    Eyes: Negative.    Respiratory: Negative.    Cardiovascular: Positive for leg swelling.   Gastrointestinal: Positive for abdominal pain, constipation, nausea and vomiting.   Endocrine: Negative.    Genitourinary: Negative.    Musculoskeletal: Negative.    Skin: Negative.    Allergic/Immunologic: Negative.    Neurological: Negative.    Hematological: Negative.    Psychiatric/Behavioral: Negative.        Past Medical History:   Diagnosis Date   . Allergic rhinitis    . Anemia    . Anesthesia     Nausea/Vomiting Post Op In Past   . Anxiety    . Arthritis     s/p bilateral hip and right knee replacement sees Dr. Chriss Czar   . Back problem     "Occ Back Hurts"   . Blood transfusion    . CAD (coronary artery disease) 2008    Sees Dr. Brandt Loosen CABG x 3   . DCIS (ductal carcinoma in situ) 05/20/2011   . Glaucoma     Bilateral Eyes   . H/O  cardiovascular stress test 05/20/11   . Heart attack ([place]) 2006   . HOH (hard of hearing)     Bilateral Ears   . Hyperlipidemia    . Hypertension    . IBS (irritable bowel syndrome)    . Kidney problem     "Sees  Dr. Maretta Bees For Blood Pressure And Kidneys"   . Nausea & vomiting     Nausea/Vomiting Post Op In Past   . Osteoarthritis    . Osteoporosis    . Right Breast Cancer Dx 7-12   . Shortness of breath on exertion    . Staph infection 1980's    "They sent me to a tropical infection doctor"   . TB (tuberculosis)     "Probably had it as a child, my dad had it"   . Type II or unspecified type diabetes mellitus without mention of complication, not stated as uncontrolled Dx 1980's     Past Surgical History:   Procedure Laterality Date   . BREAST LUMPECTOMY  05/20/2011    Right   . BREAST SURGERY  7-12    Right Breast Biopsy   .  CARDIAC SURGERY  2008    CABG (3 Bypasses)   . CARPAL TUNNEL RELEASE  2000's    Right   . CESAREAN SECTION  "Late 1950's or Early 1960's"    X 3   . CHOLECYSTECTOMY, LAPAROSCOPIC  1990's   . COLONOSCOPY  2000's   . CORONARY ARTERY BYPASS GRAFT  2008    cabg x 3   . ENDOSCOPY, COLON, DIAGNOSTIC  1990's   . HYSTERECTOMY, TOTAL ABDOMINAL  1960's   . JOINT REPLACEMENT  1980's    Total Right Hip   . JOINT REPLACEMENT  2000's    Total Right Hip   . JOINT REPLACEMENT  1990's    Total Left Hip   . JOINT REPLACEMENT  1980's    Total Right Knee   . OTHER SURGICAL HISTORY      Family Physician Is Dr. Evelina Bucy In [place], [place]   . OTHER SURGICAL HISTORY  2000's    "Took section of my intestines out, it was a exploratory operation, they found scar tissue from the c-sections"   . TONSILLECTOMY AND ADENOIDECTOMY  1939     Family History   Problem Relation Age of Onset   . Heart Disease Mother    . Cancer Mother         "Cancer In Her Eye"   . Depression Mother    . Mental Illness Mother    . Diabetes Father    . Heart Disease Father         "Heart Attack"   . Hearing Loss Father    . Cancer Sister          "Breast Cancer"   . Early Death Brother         "At [employer]   . Diabetes Sister    . Vision Loss Son         "Eye Problems"   . Other Son         "Alot of health problems due to motorcycle wreck"   . Arthritis Daughter    . Early Death Son         "Early 84's"     Social History     Social History   . Marital status: Widowed     Spouse name: N/A   . Number of children: N/A   . Years of education: N/A     Occupational History   . Not on file.     Social History Main Topics   . Smoking status: Never Smoker   . Smokeless tobacco: Never Used   . Alcohol use No   . Drug use: No   . Sexual activity: No     Other Topics Concern   . Not on file     Social History Narrative    Do you donate blood or plasma? No    Caffeine intake? Moderate    Advance directive? No    Is blood transfusion acceptable in an emergency? Yes    Live alone or with others? With others    Sunscreen used routinely? No able to care for self? Yes             Current Facility-Administered Medications   Medication Dose Route Frequency Provider Last Rate Last Dose   . ondansetron (ZOFRAN) injection 4 mg  4 mg Intravenous Q30 Min PRN Konrad Felix, DO   4 mg at 09/15/17 2113   . sodium chloride flush 0.9 % injection 10 mL  10 mL Intravenous  PRN Konrad Felix, DO   10 mL at 09/15/17 1850     Current Outpatient Prescriptions   Medication Sig Dispense Refill   . clindamycin (CLEOCIN) 150 MG capsule Take 150 mg by mouth 3 times daily     . acyclovir (ZOVIRAX) 800 MG tablet Take 800 mg by mouth 4 times daily     . predniSONE (DELTASONE) 20 MG tablet Take 20 mg by mouth daily Two tabs daily     . hydrALAZINE (APRESOLINE) 50 MG tablet Take 1 tablet by mouth 3 times daily One and one half Tablets Twice Daily 60 tablet 0   . iron polysaccharides (FERREX 150) 150 MG capsule Take 150 mg by mouth 2 times daily     . naphazoline-pheniramine (NAPHCON-A) 0.025-0.3 % ophthalmic solution 1 drop 4 times daily     . B Complex-Biotin-FA (SUPER B-50 B COMPLEX PO) Take by  mouth     . fluticasone (FLONASE) 50 MCG/ACT nasal spray 1 spray by Nasal route daily     . anastrozole (ARIMIDEX) 1 MG tablet Take 1 tablet by mouth daily 30 tablet 11   . diltiazem (TIAZAC) 240 MG SR capsule Take 240 mg by mouth every evening.       . furosemide (LASIX) 40 MG tablet Take 40 mg by mouth daily      . carvedilol (COREG) 6.25 MG tablet Take 3.125 mg by mouth 2 times daily         Allergies   Allergen Reactions   . Celebrex [Celecoxib] Shortness Of Breath, Swelling and Anaphylaxis     "Swelling Of The Esophagus"   . Fish Allergy Anaphylaxis   . Lisinopril Other (See Comments)     Dry Mouth   . Neurontin [Gabapentin] Other (See Comments)     Dizziness   . Norvasc [Amlodipine Besylate] Other (See Comments)     Edema Of Legs   . Other      "Allergic To Certain Seafoods Causing Headaches, Difficulty Breathing"   . Shellfish Allergy Anaphylaxis   . Ciprofloxacin Rash   . Lipitor Other (See Comments)     Ache   . Losartan      Per nephrologist     Current Facility-Administered Medications   Medication Dose Route Frequency Provider Last Rate Last Dose   . ondansetron (ZOFRAN) injection 4 mg  4 mg Intravenous Q30 Min PRN Konrad Felix, DO   4 mg at 09/15/17 2113   . sodium chloride flush 0.9 % injection 10 mL  10 mL Intravenous PRN Konrad Felix, DO   10 mL at 09/15/17 1850     Current Outpatient Prescriptions   Medication Sig Dispense Refill   . clindamycin (CLEOCIN) 150 MG capsule Take 150 mg by mouth 3 times daily     . acyclovir (ZOVIRAX) 800 MG tablet Take 800 mg by mouth 4 times daily     . predniSONE (DELTASONE) 20 MG tablet Take 20 mg by mouth daily Two tabs daily     . hydrALAZINE (APRESOLINE) 50 MG tablet Take 1 tablet by mouth 3 times daily One and one half Tablets Twice Daily 60 tablet 0   . iron polysaccharides (FERREX 150) 150 MG capsule Take 150 mg by mouth 2 times daily     . naphazoline-pheniramine (NAPHCON-A) 0.025-0.3 % ophthalmic solution 1 drop 4 times daily     . B Complex-Biotin-FA  (SUPER B-50 B COMPLEX PO) Take by mouth     . fluticasone (FLONASE) 50 MCG/ACT nasal spray 1  spray by Nasal route daily     . anastrozole (ARIMIDEX) 1 MG tablet Take 1 tablet by mouth daily 30 tablet 11   . diltiazem (TIAZAC) 240 MG SR capsule Take 240 mg by mouth every evening.       . furosemide (LASIX) 40 MG tablet Take 40 mg by mouth daily      . carvedilol (COREG) 6.25 MG tablet Take 3.125 mg by mouth 2 times daily          Nursing Notes Reviewed    VITAL SIGNS:  ED Triage Vitals   Enc Vitals Group      BP       Pulse       Resp       Temp       Temp src       SpO2       Weight       Height       Head Circumference       Peak Flow       Pain Score       Pain Loc       Pain Edu?       Excl. in [place]?        PHYSICAL EXAM:  Physical Exam   Constitutional: She is oriented to person, place, and time. She appears well-developed and well-nourished. She is cooperative.  Non-toxic appearance. She does not have a sickly appearance. She appears ill. No distress.   HENT:   Head: Normocephalic and atraumatic.   Right Ear: External ear normal.   Left Ear: External ear normal.   Mouth/Throat: Oropharynx is clear and moist. No oropharyngeal exudate.   Eyes: Pupils are equal, round, and reactive to light. Conjunctivae and EOM are normal. Right eye exhibits no discharge. Left eye exhibits no discharge. No scleral icterus.   Neck: Normal range of motion, full passive range of motion without pain and phonation normal. No JVD present. No neck rigidity. No edema, no erythema and normal range of motion present.   Cardiovascular: Normal rate, regular rhythm, normal heart sounds and intact distal pulses.  Exam reveals no gallop and no friction rub.    No murmur heard.  Pulmonary/Chest: Effort normal and breath sounds normal. No stridor. No respiratory distress. She has no wheezes. She has no rales.   Abdominal: Soft. Normal appearance and bowel sounds are normal. She exhibits distension. She exhibits no pulsatile midline mass and no  mass. There is generalized tenderness. There is no rebound and no guarding. No hernia.   Musculoskeletal: Normal range of motion. She exhibits edema. She exhibits no tenderness or deformity.   Neurological: She is alert and oriented to person, place, and time. She has normal strength. She is not disoriented. She displays no atrophy and no tremor. No cranial nerve deficit or sensory deficit. She exhibits normal muscle tone. She displays no seizure activity. Coordination normal. GCS eye subscore is 4. GCS verbal subscore is 5. GCS motor subscore is 6.   Skin: Skin is warm. No rash noted. She is not diaphoretic. No erythema. No pallor.   Psychiatric: She has a normal mood and affect. Her behavior is normal. Judgment and thought content normal.   Nursing note and vitals reviewed.      I have reviewed andinterpreted all of the currently available lab results from this visit (if applicable):    Results for orders placed or performed during the hospital encounter of 09/15/17   CBC Auto Differential  Result Value Ref Range    WBC 7.9 4.0 - 10.5 K/CU MM    RBC 2.91 (L) 4.2 - 5.4 M/CU MM    Hemoglobin 10.0 (L) 12.5 - 16.0 GM/DL    Hematocrit 30.1 (L) 37 - 47 %    MCV 103.4 (H) 78 - 100 FL    MCH 34.4 (H) 27 - 31 PG    MCHC 33.2 32.0 - 36.0 %    RDW 16.8 (H) 11.7 - 14.9 %    Platelets 393 140 - 440 K/CU MM    MPV 10.6 7.5 - 11.1 FL    Differential Type AUTOMATED DIFFERENTIAL     Segs Relative 81.6 (H) 36 - 66 %    Lymphocytes % 9.2 (L) 24 - 44 %    Monocytes % 8.6 (H) 0 - 4 %    Eosinophils % 0.4 0 - 3 %    Basophils % 0.1 0 - 1 %    Segs Absolute 6.5 K/CU MM    Lymphocytes # 0.7 K/CU MM    Monocytes # 0.7 K/CU MM    Eosinophils # 0.0 K/CU MM    Basophils # 0.0 K/CU MM    Immature Neutrophil % 0.1 0 - 0.43 %    Total Immature Neutrophil 0.01 K/CU MM   CMP   Result Value Ref Range    Sodium 139 135 - 145 MMOL/L    Potassium 4.0 3.5 - 5.1 MMOL/L    Chloride 94 (L) 99 - 110 mMol/L    CO2 25 21 - 32 MMOL/L    BUN 80 (H) 6 - 23  MG/DL    CREATININE 4.3 (H) 0.6 - 1.1 MG/DL    Glucose 185 (H) 70 - 99 MG/DL    Calcium 7.9 (L) 8.3 - 10.6 MG/DL    Alb 3.9 3.4 - 5.0 GM/DL    Total Protein 6.5 6.4 - 8.2 GM/DL    Total Bilirubin 0.3 0.0 - 1.0 MG/DL    ALT 7 (L) 10 - 40 U/L    AST 16 15 - 37 IU/L    Alkaline Phosphatase 95 40 - 129 IU/L    GFR Non-African American 10 (L) >60 mL/min/1.34m2    GFR African American 12 (L) >60 mL/min/1.78m2    Anion Gap 20 (H) 4 - 16   Lipase   Result Value Ref Range    Lipase 4 (L) 13 - 60 IU/L   Urinalysis (Lab)   Result Value Ref Range    Color, UA YELLOW (A) UYELL    Clarity, UA HAZY (A) CLEAR    Glucose, Urine NEGATIVE NEG MG/DL    Bilirubin Urine NEGATIVE NEG MG/DL    Ketones, Urine NEGATIVE NEG MG/DL    Specific Gravity, UA 1.020 1.001 - 1.035    Blood, Urine 1+ (A) NEG    pH, Urine 6.0 5.0 - 8.0    Protein, UA 100 (A) NEG MG/DL    Urobilinogen, Urine 0.2 0.2 - 1.0 MG/DL    Nitrite Urine, Quantitative NEGATIVE NEG    Leukocyte Esterase, Urine NEGATIVE NEG    RBC, UA 12 TO 15 0 - 6 /HPF    WBC, UA 1 TO 3 0 - 5 /HPF    Epi Cells 4 TO 6 /HPF    Cast Type NEGATIVE (A) NCFS /HPF    Bacteria, UA MODERATE (A) NEG /HPF    Crystal Type NEGATIVE NEG /HPF   Troponin   Result Value Ref Range  Troponin T 0.076 (H) <0.01 NG/ML   CK   Result Value Ref Range    Total CK 59 26 - 140 IU/L   Brain Natriuretic Peptide   Result Value Ref Range    Pro-BNP 11,858 (H) <300 PG/ML   EKG 12 Lead   Result Value Ref Range    Ventricular Rate 64 BPM    Atrial Rate 64 BPM    P-R Interval 210 ms    QRS Duration 100 ms    Q-T Interval 460 ms    QTc Calculation (Bazett) 474 ms    P Axis -27 degrees    R Axis -35 degrees    T Axis 71 degrees    Diagnosis       Sinus rhythm with 1st degree AV block  Left axis deviation  Minimal voltage criteria for LVH, may be normal variant  Septal infarct (cited on or before 27-Apr-2015)  Abnormal ECG  When compared with ECG of 18-Aug-2017 11:58,  No significant change was found          Radiographs (if  obtained):  []  The following radiograph was interpreted by myself in the absence of a radiologist:  [x]  Radiologist's Report Reviewed:    CT Abd/pelv    Xr Chest Standard (2 Vw)    Result Date: 08/18/2017  EXAMINATION: TWO VIEWS OF THE CHEST, 08/18/2017 12:38 pm COMPARISON: 04/27/2015 HISTORY: ORDERING SYSTEM PROVIDED HISTORY: Chest pain TECHNOLOGIST PROVIDED HISTORY: Reason for exam:->Chest pain Reason for exam:->Fall Ordering Physician Provided Reason for Exam: Fall today Acuity: Acute Type of Exam: Initial Mechanism of Injury: Fall today Relevant Medical/Surgical History: Fall today FINDINGS: Status post median sternotomy.  Heart size there is within normal limits. Mitral annulus calcifications.  Small left pleural effusion.  Left basilar opacification.  Subtle focal opacification in the right lung apex, new from prior study.  Thoracic aortic atherosclerotic disease.     1. Small left pleural effusion.  Left basilar atelectasis/infiltrate. 2. New nodular opacity in the right lung apex.  Chest CT recommended for further evaluation.     Xr Sacrum Coccyx (min 2 Views)    Result Date: 08/18/2017  EXAMINATION: 3 XRAY VIEWS OF THE SACRUM/COCCYX 08/18/2017 12:38 pm COMPARISON: None. HISTORY: ORDERING SYSTEM PROVIDED HISTORY: fall TECHNOLOGIST PROVIDED HISTORY: Reason for exam:->fall Ordering Physician Provided Reason for Exam: fall today, coccyx pain Acuity: Acute Type of Exam: Initial Mechanism of Injury: fall today, coccyx pain Relevant Medical/Surgical History: fall today, coccyx pain FINDINGS: No acute sacrococcygeal fracture is detected.  Moderate degenerative change identified within the lower lumbar spine.  Bilateral total hip arthroplasties are noted.  There is evidence of osteopenia.     No evidence of acute displaced sacrococcygeal fracture. Osteopenia.     Ct Head Wo Contrast    Result Date: 08/18/2017  EXAMINATION: CT OF THE HEAD WITHOUT CONTRAST  08/18/2017 12:38 pm TECHNIQUE: CT of the head was performed  without the administration of intravenous contrast. Dose modulation, iterative reconstruction, and/or weight based adjustment of the mA/kV was utilized to reduce the radiation dose to as low as reasonably achievable. COMPARISON: None. HISTORY: ORDERING SYSTEM PROVIDED HISTORY: Fall; occipital strike TECHNOLOGIST PROVIDED HISTORY: Has a "code stroke" or "stroke alert" been called?->No Ordering Physician Provided Reason for Exam: fall, occipital contusion, no loc Acuity: Acute Type of Exam: Initial Mechanism of Injury: fall, occipital contusion, no loc Relevant Medical/Surgical History: fall, occipital contusion, no loc FINDINGS: BRAIN/VENTRICLES: There is no acute intracranial hemorrhage, mass effect or midline shift. No abnormal extra-axial  fluid collection.  The gray-white differentiation is maintained without evidence of an acute infarct. There is prominence of the ventricles and sulci due to global parenchymal volume loss. There are nonspecific areas of hypoattenuation within the periventricular and subcortical white matter, which likely represent chronic microvascular ischemic change. ORBITS: The visualized portion of the orbits demonstrate no acute abnormality. SINUSES: The visualized paranasal sinuses and mastoid air cells demonstrate no acute abnormality. SOFT TISSUES/SKULL: Moderate occipital scalp hematoma.  No underlying calvarial fracture.     1. No acute intracranial abnormality. 2. Moderate occipital scalp hematoma.  No underlying calvarial fracture. 3. Chronic small vessel ischemic white matter disease and diffuse cerebral volume loss.     Vl Dup Lower Extremity Venous Left    Result Date: 08/18/2017  EXAMINATION: DUPLEX VENOUS ULTRASOUND OF THE LEFT LOWER EXTREMITY 08/18/2017 12:24 pm TECHNIQUE: Duplex ultrasound and Doppler images were obtained of the left lower extremity. COMPARISON: None. HISTORY: ORDERING SYSTEM PROVIDED HISTORY: swollen, tender leg ? DVT TECHNOLOGIST PROVIDED HISTORY: Reason for  exam:->swollen, tender leg ? DVT Ordering Physician Provided Reason for Exam: edema/pain Acuity: Acute FINDINGS: The visualized veins of the left lower extremity are patent and free of echogenic thrombus. The veins are normally compressible and have normal phasic flow.     No evidence of DVT in the left lower extremity.       EKG (if obtained): (All EKG's are interpreted by myself in the absence of a cardiologist)    12 lead EKG per my interpretation:  Normal Sinus Rhythm 64 first degree AV block  Axis is   Normal  QTc is  474  There is no specific T wave changes appreciated.  There is specific ST wave changes appreciated.  Slight ST elevation in V2  Prior EKG to compare with was not available     Total critical care time today provided was at least 35 minutes. This excludes seperately billable procedure. Critical care time provided for reviewing labs, images insulting medicine getting fluids that required close evaluation and/or intervention with concern for patient decompensation.        MDM:    Patient with nausea vomiting bowel pain and constipation.  She's not had a bowel movement in 3 days.  She started 7 times today.  She has no chest pain she has chronic shortness of breath she states her bilateral leg swelling today is new she's had again constipation nausea vomiting.  She did not take any medicine at home for this to help she has nausea medicine and Tylenol home did not take it.  She has no questions or concerns vital signs are stable.  I'll give her pain medicine nausea medicine IV fluids I will do a CT abdomen pelvis I will perform cardiac workup.  Patient otherwise well-appearing.  She has good pulses.  I don't see signs of compartment syndrome.  Rechecked patient is feeling better.  Workup does show worsening in any function her creatinine today is 4.3 which is new also has peripheral edema and constipation no obstruction.  Also has a elevated troponin and BMP which is chronic.  Patient will be  transferred to [facility] for hydration, nephrology evaluation, observation.  Patient was stable.  Does have some pancreatic lesions.  Patient stable here I did transfer her to [facility] I did talk to hospital medicine a transfer her there because her troponin is elevated but chronically so her BNP is elevated but chronically so she's had constipation and her serum creatinine is worsening  than normal.  Given IV fluids.  Transfer to [facility] for evaluation.    CLINICAL IMPRESSION:  Final diagnoses:   Nausea and vomiting, intractability of vomiting not specified, unspecified vomiting type   Constipation, unspecified constipation type   Serum creatinine raised   Pancreatic lesion   Troponin level elevated   Elevated brain natriuretic peptide (BNP) level       (Please note that portions of this note may have been completed with a voice recognition program. Efforts were made to edit the dictations but occasionally words aremis-transcribed.)    DISPOSITION REFERRAL (if applicable):  No follow-up provider specified.    DISPOSITION MEDICATIONS (if applicable):  New Prescriptions    No medications on file          Konrad Felix, [place], DO  09/15/17 [place], DO  09/15/17 2348

## 2017-09-16 ENCOUNTER — Inpatient Hospital Stay: Admit: 2017-09-16 | Payer: MEDICARE | Primary: Internal Medicine

## 2017-09-16 ENCOUNTER — Inpatient Hospital Stay
Admit: 2017-09-16 | Discharge: 2017-09-26 | Disposition: A | Discharge status: Home / Self Care | Payer: MEDICARE | Attending: Hematology & Oncology | Admitting: Hematology & Oncology

## 2017-09-16 DIAGNOSIS — N179 Acute kidney failure, unspecified: Secondary | ICD-10-CM

## 2017-09-16 LAB — CBC WITH AUTO DIFFERENTIAL
Basophils %: 0.1 % (ref 0–1)
Basophils %: 0.3 % (ref 0–1)
Basophils %: 0.4 % (ref 0–1)
Basophils Absolute: 0 10*3/uL
Basophils Absolute: 0 10*3/uL
Basophils Absolute: 0 10*3/uL
Eosinophils %: 0.4 % (ref 0–3)
Eosinophils %: 0.3 % (ref 0–3)
Eosinophils %: 0.8 % (ref 0–3)
Eosinophils Absolute: 0 10*3/uL
Eosinophils Absolute: 0 10*3/uL
Eosinophils Absolute: 0.1 10*3/uL
Hematocrit: 30.1 % — ABNORMAL LOW (ref 37–47)
Hematocrit: 23.1 % — ABNORMAL LOW (ref 37–47)
Hematocrit: 26.3 % — ABNORMAL LOW (ref 37–47)
Hemoglobin: 10 GM/DL — ABNORMAL LOW (ref 12.5–16.0)
Hemoglobin: 7.5 GM/DL — ABNORMAL LOW (ref 12.5–16.0)
Hemoglobin: 8.5 GM/DL — ABNORMAL LOW (ref 12.5–16.0)
Immature Neutrophil %: 0.1 % (ref 0–0.43)
Immature Neutrophil %: 0.4 % (ref 0–0.43)
Immature Neutrophil %: 0.3 % (ref 0–0.43)
Lymphocytes %: 9.2 % — ABNORMAL LOW (ref 24–44)
Lymphocytes %: 11.4 % — ABNORMAL LOW (ref 24–44)
Lymphocytes %: 15.1 % — ABNORMAL LOW (ref 24–44)
Lymphocytes Absolute: 0.7 10*3/uL
Lymphocytes Absolute: 0.8 10*3/uL
Lymphocytes Absolute: 1.1 10*3/uL
MCH: 34.4 PG — ABNORMAL HIGH (ref 27–31)
MCH: 35 PG — ABNORMAL HIGH (ref 27–31)
MCH: 35 PG — ABNORMAL HIGH (ref 27–31)
MCHC: 33.2 % (ref 32.0–36.0)
MCHC: 32.5 % (ref 32.0–36.0)
MCHC: 32.3 % (ref 32.0–36.0)
MCV: 103.4 FL — ABNORMAL HIGH (ref 78–100)
MCV: 107.9 FL — ABNORMAL HIGH (ref 78–100)
MCV: 108.2 FL — ABNORMAL HIGH (ref 78–100)
MPV: 10.6 FL (ref 7.5–11.1)
MPV: 10 FL (ref 7.5–11.1)
MPV: 10.2 FL (ref 7.5–11.1)
Monocytes %: 8.6 % — ABNORMAL HIGH (ref 0–4)
Monocytes %: 9.9 % — ABNORMAL HIGH (ref 0–4)
Monocytes %: 11.1 % — ABNORMAL HIGH (ref 0–4)
Monocytes Absolute: 0.7 10*3/uL
Monocytes Absolute: 0.7 10*3/uL
Monocytes Absolute: 0.8 10*3/uL
Nucleated RBC %: 0 %
Nucleated RBC %: 0 %
Platelets: 393 10*3/uL (ref 140–440)
Platelets: 279 10*3/uL (ref 140–440)
Platelets: 333 10*3/uL (ref 140–440)
RBC: 2.91 10*6/uL — ABNORMAL LOW (ref 4.2–5.4)
RBC: 2.14 10*6/uL — ABNORMAL LOW (ref 4.2–5.4)
RBC: 2.43 10*6/uL — ABNORMAL LOW (ref 4.2–5.4)
RDW: 16.8 % — ABNORMAL HIGH (ref 11.7–14.9)
RDW: 16.7 % — ABNORMAL HIGH (ref 11.7–14.9)
RDW: 17.1 % — ABNORMAL HIGH (ref 11.7–14.9)
Segs Absolute: 6.5 10*3/uL
Segs Absolute: 5.4 10*3/uL
Segs Absolute: 5.4 10*3/uL
Segs Relative: 81.6 % — ABNORMAL HIGH (ref 36–66)
Segs Relative: 77.7 % — ABNORMAL HIGH (ref 36–66)
Segs Relative: 72.3 % — ABNORMAL HIGH (ref 36–66)
Total Immature Neutrophil: 0.01 10*3/uL
Total Immature Neutrophil: 0.03 10*3/uL
Total Immature Neutrophil: 0.02 10*3/uL
Total Nucleated RBC: 0 10*3/uL
Total Nucleated RBC: 0 10*3/uL
WBC: 7.9 10*3/uL (ref 4.0–10.5)
WBC: 7 10*3/uL (ref 4.0–10.5)
WBC: 7.5 10*3/uL (ref 4.0–10.5)

## 2017-09-16 LAB — COMPREHENSIVE METABOLIC PANEL
ALT: 7 U/L — ABNORMAL LOW (ref 10–40)
AST: 16 IU/L (ref 15–37)
Albumin: 3.9 GM/DL (ref 3.4–5.0)
Alkaline Phosphatase: 95 IU/L (ref 40–129)
Anion Gap: 20 — ABNORMAL HIGH (ref 4–16)
BUN: 80 MG/DL — ABNORMAL HIGH (ref 6–23)
CO2: 25 MMOL/L (ref 21–32)
Calcium: 7.9 MG/DL — ABNORMAL LOW (ref 8.3–10.6)
Chloride: 94 mMol/L — ABNORMAL LOW (ref 99–110)
Creatinine: 4.3 MG/DL — ABNORMAL HIGH (ref 0.6–1.1)
GFR African American: 12 mL/min/{1.73_m2} — ABNORMAL LOW (ref >60)
GFR Non-African American: 10 mL/min/{1.73_m2} — ABNORMAL LOW (ref >60)
Glucose: 185 MG/DL — ABNORMAL HIGH (ref 70–99)
Potassium: 4 MMOL/L (ref 3.5–5.1)
Sodium: 139 MMOL/L (ref 135–145)
Total Bilirubin: 0.3 MG/DL (ref 0.0–1.0)
Total Protein: 6.5 GM/DL (ref 6.4–8.2)

## 2017-09-16 LAB — URINALYSIS
Bilirubin Urine: NEGATIVE MG/DL
Cast Type: NEGATIVE /HPF — AB
Crystal Type: NEGATIVE /HPF
Epithelial Cells, UA: 4 /HPF
Glucose, Urine: NEGATIVE MG/DL
Ketones, Urine: NEGATIVE MG/DL
Leukocyte Esterase, Urine: NEGATIVE
Nitrite Urine, Quantitative: NEGATIVE
Protein, UA: 100 MG/DL — AB
RBC, UA: 12 /HPF (ref 0–6)
Specific Gravity, UA: 1.02 (ref 1.001–1.035)
Urobilinogen, Urine: 0.2 MG/DL (ref 0.2–1.0)
WBC, UA: 1 /HPF (ref 0–5)
pH, Urine: 6 (ref 5.0–8.0)

## 2017-09-16 LAB — OSMOLALITY, URINE: Osmolality, Ur: 322 MOS/L (ref 292–1090)

## 2017-09-16 LAB — PROTEIN / CREATININE RATIO, URINE
Creatinine, Ur: 70.1 MG/DL (ref 28–217)
Prot/Creat Ratio, Ur: 2.7 — ABNORMAL HIGH (ref <0.2)
Urine Total Protein: 191.6 MG/DL — ABNORMAL HIGH (ref <12)

## 2017-09-16 LAB — BASIC METABOLIC PANEL
Anion Gap: 12 (ref 4–16)
Anion Gap: 15 (ref 4–16)
BUN: 70 MG/DL — ABNORMAL HIGH (ref 6–23)
BUN: 69 MG/DL — ABNORMAL HIGH (ref 6–23)
CO2: 26 MMOL/L (ref 21–32)
CO2: 24 MMOL/L (ref 21–32)
Calcium: 7.6 MG/DL — ABNORMAL LOW (ref 8.3–10.6)
Calcium: 7.8 MG/DL — ABNORMAL LOW (ref 8.3–10.6)
Chloride: 99 mMol/L (ref 99–110)
Chloride: 101 mMol/L (ref 99–110)
Creatinine: 3.9 MG/DL — ABNORMAL HIGH (ref 0.6–1.1)
Creatinine: 3.6 MG/DL — ABNORMAL HIGH (ref 0.6–1.1)
GFR African American: 13 mL/min/{1.73_m2} — ABNORMAL LOW (ref >60)
GFR African American: 15 mL/min/{1.73_m2} — ABNORMAL LOW (ref >60)
GFR Non-African American: 11 mL/min/{1.73_m2} — ABNORMAL LOW (ref >60)
GFR Non-African American: 12 mL/min/{1.73_m2} — ABNORMAL LOW (ref >60)
Glucose: 134 MG/DL — ABNORMAL HIGH (ref 70–99)
Glucose: 89 MG/DL (ref 70–99)
Potassium: 3.8 MMOL/L (ref 3.5–5.1)
Potassium: 4.2 MMOL/L (ref 3.5–5.1)
Sodium: 137 MMOL/L (ref 135–145)
Sodium: 140 MMOL/L (ref 135–145)

## 2017-09-16 LAB — CREATININE, RANDOM URINE: Creatinine, Ur: 70 MG/DL

## 2017-09-16 LAB — BRAIN NATRIURETIC PEPTIDE: Pro-BNP: 11858 PG/ML — ABNORMAL HIGH (ref <300)

## 2017-09-16 LAB — MAGNESIUM
Magnesium: 2.5 mg/dl — ABNORMAL HIGH (ref 1.8–2.4)
Magnesium: 2.8 mg/dl — ABNORMAL HIGH (ref 1.8–2.4)

## 2017-09-16 LAB — EOSINOPHIL SMEAR, URINE: Eosinophil, Urine: NONE SEEN /HPF

## 2017-09-16 LAB — TROPONIN: Troponin T: 0.076 NG/ML — ABNORMAL HIGH (ref <0.01)

## 2017-09-16 LAB — SODIUM, URINE, RANDOM: Sodium, Ur: 36 MMOL/L (ref 35–167)

## 2017-09-16 LAB — CK: Total CK: 59 IU/L (ref 26–140)

## 2017-09-16 LAB — LIPASE: Lipase: 4 IU/L — ABNORMAL LOW (ref 13–60)

## 2017-09-16 MED ORDER — CARVEDILOL 3.125 MG PO TABS
3.125 MG | Freq: Two times a day (BID) | ORAL | Status: DC
Start: 2017-09-16 — End: 2017-09-26
  Administered 2017-09-16 – 2017-09-26 (×20): 3.125 mg via ORAL

## 2017-09-16 MED ORDER — SENNOSIDES 8.6 MG PO TABS
8.6 MG | Freq: Two times a day (BID) | ORAL | Status: DC
Start: 2017-09-16 — End: 2017-09-26
  Administered 2017-09-16 – 2017-09-26 (×14): 17.2 via ORAL

## 2017-09-16 MED ORDER — ONDANSETRON HCL 4 MG/2ML IJ SOLN
4 MG/2ML | Freq: Four times a day (QID) | INTRAMUSCULAR | Status: DC | PRN
Start: 2017-09-16 — End: 2017-09-26
  Administered 2017-09-16 – 2017-09-26 (×11): 4 mg via INTRAVENOUS

## 2017-09-16 MED ORDER — HYDRALAZINE HCL 50 MG PO TABS
50 MG | Freq: Three times a day (TID) | ORAL | Status: DC
Start: 2017-09-16 — End: 2017-09-19
  Administered 2017-09-17 – 2017-09-19 (×7): 50 mg via ORAL

## 2017-09-16 MED ORDER — ANASTROZOLE 1 MG PO TABS
1 MG | Freq: Every day | ORAL | Status: DC
Start: 2017-09-16 — End: 2017-09-26
  Administered 2017-09-17 – 2017-09-26 (×10): 1 mg via ORAL

## 2017-09-16 MED ORDER — POLYETHYLENE GLYCOL 3350 17 G PO PACK
17 g | Freq: Every day | ORAL | Status: DC
Start: 2017-09-16 — End: 2017-09-26
  Administered 2017-09-16 – 2017-09-26 (×7): 17 g via ORAL

## 2017-09-16 MED ORDER — HEPARIN SODIUM (PORCINE) 5000 UNIT/ML IJ SOLN
5000 UNIT/ML | Freq: Three times a day (TID) | INTRAMUSCULAR | Status: DC
Start: 2017-09-16 — End: 2017-09-26
  Administered 2017-09-16 – 2017-09-26 (×29): 5000 [IU] via SUBCUTANEOUS

## 2017-09-16 MED ORDER — PANTOPRAZOLE SODIUM 40 MG PO TBEC
40 MG | Freq: Two times a day (BID) | ORAL | Status: DC
Start: 2017-09-16 — End: 2017-09-26
  Administered 2017-09-16 – 2017-09-26 (×20): 40 mg via ORAL

## 2017-09-16 MED ORDER — POLYSACCHARIDE IRON COMPLEX 150 MG PO CAPS
150 MG | Freq: Two times a day (BID) | ORAL | Status: DC
Start: 2017-09-16 — End: 2017-09-26
  Administered 2017-09-17 – 2017-09-26 (×20): 150 mg via ORAL

## 2017-09-16 MED ORDER — SODIUM CHLORIDE 0.9 % IV SOLN
0.9 % | INTRAVENOUS | Status: AC
Start: 2017-09-16 — End: 2017-09-18
  Administered 2017-09-16 – 2017-09-17 (×3): via INTRAVENOUS

## 2017-09-16 MED ORDER — PROCHLORPERAZINE EDISYLATE 5 MG/ML IJ SOLN
5 MG/ML | Freq: Four times a day (QID) | INTRAMUSCULAR | Status: DC | PRN
Start: 2017-09-16 — End: 2017-09-26
  Administered 2017-09-20: 06:00:00 10 mg via INTRAVENOUS

## 2017-09-16 MED ORDER — DOCUSATE SODIUM 100 MG PO CAPS
100 MG | Freq: Two times a day (BID) | ORAL | Status: DC
Start: 2017-09-16 — End: 2017-09-26
  Administered 2017-09-16 – 2017-09-26 (×17): 100 mg via ORAL

## 2017-09-16 MED FILL — PROCHLORPERAZINE EDISYLATE 5 MG/ML IJ SOLN: 5 mg/mL | INTRAMUSCULAR | Qty: 2

## 2017-09-16 MED FILL — HEPARIN SODIUM (PORCINE) 5000 UNIT/ML IJ SOLN: 5000 [IU]/mL | INTRAMUSCULAR | Qty: 1

## 2017-09-16 MED FILL — SODIUM CHLORIDE 0.9 % IV SOLN: 0.9 % | INTRAVENOUS | Qty: 1000

## 2017-09-16 MED FILL — CARVEDILOL 3.125 MG PO TABS: 3.125 mg | ORAL | Qty: 1

## 2017-09-16 MED FILL — DOK 100 MG PO CAPS: 100 mg | ORAL | Qty: 1

## 2017-09-16 MED FILL — ONDANSETRON HCL 4 MG/2ML IJ SOLN: 4 MG/2ML | INTRAMUSCULAR | Qty: 2

## 2017-09-16 MED FILL — ANASTROZOLE 1 MG PO TABS: 1 mg | ORAL | Qty: 1

## 2017-09-16 MED FILL — SENNA-LAX 8.6 MG PO TABS: 8.6 mg | ORAL | Qty: 2

## 2017-09-16 MED FILL — PANTOPRAZOLE SODIUM 40 MG PO TBEC: 40 mg | ORAL | Qty: 1

## 2017-09-16 MED FILL — POLYETHYLENE GLYCOL 3350 17 G PO PACK: 17 g | ORAL | Qty: 1

## 2017-09-16 NOTE — H&P (Signed)
HISTORY AND PHYSICAL  (Hospitalist, Internal Medicine)  IDENTIFYING INFORMATION   PATIENT:  Judy Velasquez  MRN:  [id number]  ADMIT DATE: 09/16/2017  TIME OF EVALUATION: 09/16/2017 1:33 AM    CHIEF COMPLAINT   AKI found in ED with nausea and emesis    HISTORY OF PRESENT ILLNESS   Judy Velasquez is a 83 y.o. female who is a patchy historian who lives with grand dtr who is not present is a direct transfer from [place] ED after reported several days of intractable emesis, anorexia, unable to tolerate PO intake - liquids and solids. Found AKI on CKD at [facility] ED. She reports decreased appetite and unable to tolerate food but denies dysphagia. Has been constipation for days now.     She reports being first admitted to [facility] at the end of Nov for a fall and had been transitioned to rehab there before being discharged back home in the past 1-2 days. She was unable to tell me more about her hospital course at [facility]. Will need to await her grand dtr to come in the morning to discuss     PAST MEDICAL, SURGICAL, FAMILY, and SOCIAL HISTORY     Past Medical History:   Diagnosis Date   . Allergic rhinitis    . Anemia    . Anesthesia     Nausea/Vomiting Post Op In Past   . Anxiety    . Arthritis     s/p bilateral hip and right knee replacement sees Dr. Chriss Czar   . Back problem     "Occ Back Hurts"   . Blood transfusion    . CAD (coronary artery disease) 2008    Sees Dr. Brandt Loosen CABG x 3   . DCIS (ductal carcinoma in situ) 05/20/2011   . Glaucoma     Bilateral Eyes   . H/O cardiovascular stress test 05/20/11   . Heart attack ([place]) 2006   . HOH (hard of hearing)     Bilateral Ears   . Hyperlipidemia    . Hypertension    . IBS (irritable bowel syndrome)    . Kidney problem     "Sees  Dr. Maretta Bees For Blood Pressure And Kidneys"   . Nausea & vomiting     Nausea/Vomiting Post Op In Past   . Osteoarthritis    . Osteoporosis    . Right Breast Cancer Dx 7-12   . Shortness of breath on exertion    . Staph infection 1980's     "They sent me to a tropical infection doctor"   . TB (tuberculosis)     "Probably had it as a child, my dad had it"   . Type II or unspecified type diabetes mellitus without mention of complication, not stated as uncontrolled Dx 1980's     Past Surgical History:   Procedure Laterality Date   . BREAST LUMPECTOMY  05/20/2011    Right   . BREAST SURGERY  7-12    Right Breast Biopsy   . CARDIAC SURGERY  2008    CABG (3 Bypasses)   . CARPAL TUNNEL RELEASE  2000's    Right   . CESAREAN SECTION  "Late 1950's or Early 1960's"    X 3   . CHOLECYSTECTOMY, LAPAROSCOPIC  1990's   . COLONOSCOPY  2000's   . CORONARY ARTERY BYPASS GRAFT  2008    cabg x 3   . ENDOSCOPY, COLON, DIAGNOSTIC  1990's   . HYSTERECTOMY, TOTAL ABDOMINAL  1960's   .  JOINT REPLACEMENT  1980's    Total Right Hip   . JOINT REPLACEMENT  2000's    Total Right Hip   . JOINT REPLACEMENT  1990's    Total Left Hip   . JOINT REPLACEMENT  1980's    Total Right Knee   . OTHER SURGICAL HISTORY      Family Physician Is Dr. Evelina Bucy In [place], [place]   . OTHER SURGICAL HISTORY  2000's    "Took section of my intestines out, it was a exploratory operation, they found scar tissue from the c-sections"   . TONSILLECTOMY AND ADENOIDECTOMY  1939     Family History   Problem Relation Age of Onset   . Heart Disease Mother    . Cancer Mother         "Cancer In Her Eye"   . Depression Mother    . Mental Illness Mother    . Diabetes Father    . Heart Disease Father         "Heart Attack"   . Hearing Loss Father    . Cancer Sister         "Breast Cancer"   . Early Death Brother         "At [employer]   . Diabetes Sister    . Vision Loss Son         "Eye Problems"   . Other Son         "Alot of health problems due to motorcycle wreck"   . Arthritis Daughter    . Early Death Son         "Early 78's"     Family Hx of HTN  Family Hx as reviewed above, otherwise non-contributory  Social History     Social History   . Marital status: Widowed     Spouse name: N/A   . Number of children: N/A    . Years of education: N/A     Social History Main Topics   . Smoking status: Never Smoker   . Smokeless tobacco: Never Used   . Alcohol use No   . Drug use: No   . Sexual activity: No     Other Topics Concern   . Not on file     Social History Narrative    Do you donate blood or plasma? No    Caffeine intake? Moderate    Advance directive? No    Is blood transfusion acceptable in an emergency? Yes    Live alone or with others? With others    Sunscreen used routinely? No able to care for self? Yes               MEDICATIONS   Medications Prior to Admission  Prescriptions Prior to Admission: clindamycin (CLEOCIN) 150 MG capsule, Take 150 mg by mouth 3 times daily  acyclovir (ZOVIRAX) 800 MG tablet, Take 800 mg by mouth 4 times daily  predniSONE (DELTASONE) 20 MG tablet, Take 20 mg by mouth daily Two tabs daily  hydrALAZINE (APRESOLINE) 50 MG tablet, Take 1 tablet by mouth 3 times daily One and one half Tablets Twice Daily  iron polysaccharides (FERREX 150) 150 MG capsule, Take 150 mg by mouth 2 times daily  naphazoline-pheniramine (NAPHCON-A) 0.025-0.3 % ophthalmic solution, 1 drop 4 times daily  B Complex-Biotin-FA (SUPER B-50 B COMPLEX PO), Take by mouth  fluticasone (FLONASE) 50 MCG/ACT nasal spray, 1 spray by Nasal route daily  anastrozole (ARIMIDEX) 1 MG tablet, Take 1 tablet  by mouth daily  diltiazem (TIAZAC) 240 MG SR capsule, Take 240 mg by mouth every evening.    furosemide (LASIX) 40 MG tablet, Take 40 mg by mouth daily   carvedilol (COREG) 6.25 MG tablet, Take 3.125 mg by mouth 2 times daily     Current Medications  Current Facility-Administered Medications   Medication Dose Route Frequency Provider Last Rate Last Dose   . heparin (porcine) PF injection 5,000 Units  5,000 Units Subcutaneous Q8H Vernie Ammons, MD       . docusate sodium (COLACE) capsule 100 mg  100 mg Oral BID Vernie Ammons, MD       . senna (SENOKOT) tablet 17.2 mg  2 tablet Oral BID Vernie Ammons, MD       . polyethylene glycol (GLYCOLAX)  packet 17 g  17 g Oral Daily Vernie Ammons, MD       . pantoprazole (PROTONIX) tablet 40 mg  40 mg Oral BID AC Vernie Ammons, MD       . 0.9 % sodium chloride infusion   Intravenous Continuous Vernie Ammons, MD       . ondansetron (ZOFRAN) injection 4 mg  4 mg Intravenous Q6H PRN Vernie Ammons, MD       . prochlorperazine (COMPAZINE) injection 10 mg  10 mg Intravenous Q6H PRN Vernie Ammons, MD             Allergies  Allergies   Allergen Reactions   . Celebrex [Celecoxib] Shortness Of Breath, Swelling and Anaphylaxis     "Swelling Of The Esophagus"   . Fish Allergy Anaphylaxis   . Lisinopril Other (See Comments)     Dry Mouth   . Neurontin [Gabapentin] Other (See Comments)     Dizziness   . Norvasc [Amlodipine Besylate] Other (See Comments)     Edema Of Legs   . Other      "Allergic To Certain Seafoods Causing Headaches, Difficulty Breathing"   . Shellfish Allergy Anaphylaxis   . Ciprofloxacin Rash   . Lipitor Other (See Comments)     Ache   . Losartan      Per nephrologist       REVIEW OF SYSTEMS   Within above limitations. 14 point review of systems reviewed. Pertinent positive or negative as per HPI or otherwise negative per 14 point systems review.      PHYSICAL EXAM     Wt Readings from Last 3 Encounters:   09/15/17 95 lb (43.1 kg)   08/18/17 95 lb (43.1 kg)   06/19/17 95 lb (43.1 kg)       Blood pressure (!) 142/62, pulse 77, temperature 98 F (36.7 C), temperature source Oral, resp. rate 16, SpO2 97 %, not currently breastfeeding.    General - AAO x 3  Psych - Appropriate affect/speech. No agitation  Eyes - Eye lids intact. No scleral icterus  ENT - Lips wnl. External ear clear/dry/intact. No thyromegaly on inspection  Neuro - No gross peripheral or central neuro deficits on inspection  Heart - Sinus. RRR. S1 and S2 present. Systolic murmurs appreciated. No elevated JVD appreciated  Lung - Adequate air entry b/l, No crackles/wheezes appreciated  GI - Soft. No guarding/rigidity. No hepatosplenomegaly/ascites.  BS+  GU - No CVA/suprapubic tenderness or palpable bladder distension  Skin - RUE swelling and palpable right brachial artery with swelling  LABS AND IMAGING   CBC  @CBCROUNDS @    Last 3 Hemoglobin  Lab Results   Component Value Date  HGB 10.0 09/15/2017    HGB 8.4 08/18/2017    HGB 9.8 05/05/2017     Last 3 WBC/ANC  Lab Results   Component Value Date    WBC 7.9 09/15/2017    WBC 9.1 08/18/2017    WBC 6.0 05/05/2017     No components found for: GRNLOCTYABS  Last 3 Platelets  No results found for: PLATELET  Chemistry  @CHEMROUNDS @  @LYTESROUNDS @  No results found for: LDH  Coagulation Studies  Lab Results   Component Value Date    INR 1.07 04/25/2017     Liver Function Studies  Lab Results   Component Value Date    ALT 7 09/15/2017    AST 16 09/15/2017    ALKPHOS 95 09/15/2017       Recent Imaging    Narrative   EXAMINATION:   CT OF THE ABDOMEN AND PELVIS WITHOUT CONTRAST 09/15/2017 7:54 pm      TECHNIQUE:   CT of the abdomen and pelvis was performed without the administration of   intravenous contrast. Multiplanar reformatted images are provided for review.   Dose modulation, iterative reconstruction, and/or weight based adjustment of   the mA/kV was utilized to reduce the radiation dose to as low as reasonably   achievable.      COMPARISON:   November 03, 2006      HISTORY:   ORDERING SYSTEM PROVIDED HISTORY: n/v/constipation   TECHNOLOGIST PROVIDED HISTORY:   Additional Contrast?->None   Ordering Physician Provided Reason for Exam: N/v/constipation, hx CKD stage   4, hysterectomy, cholecystectomy   Acuity: Acute   Type of Exam: Initial   Relevant Medical/Surgical History: N/v/constipation, hx CKD stage 4,   hysterectomy, cholecystectomy      FINDINGS:   Lower Chest: There are mild fibrotic changes within the lung bases   bilaterally. Trace bilateral pleural effusions are present. There is a   thickened lower esophagus.      Organs: There are calcified granulomas within both the liver and spleen. The    gallbladder has been resected. There appears to be atrophy of the pancreas   with multiple cystic intraparenchymal masses measuring up to 3.7 cm in   diameter within the pancreatic tail and 2.4 cm in diameter within the   pancreatic head. No adrenal masses are identified and there is no   hydronephrosis.      GI/Bowel: There is no small bowel obstruction or free air or free fluid.   There is diverticulosis coli without evidence of diverticulitis. A large   amount of stool is present within the proximal colon. The appendix is not   identified.      Pelvis: Limited by streak artifact created by bilateral hip arthroplasties.      Peritoneum/Retroperitoneum: There are vascular calcifications. No   pathologically enlarged lymph nodes are identified.      Bones/Soft Tissues: There appears to be a 4.7 x 4.2 cm fluid collection   ventral to the left hip with rim calcifications seen on image number 121 of   series 2. There are degenerative changes of the spine with grade 1   anterolisthesis of L4 on L5 and L5 on S1 likely due to facet arthropathy.         Impression   Thickened lower esophagus-correlate for esophagitis.      Pancreatic atrophy with multiple intraparenchymal cystic masses.   Differential diagnosis includes, but is not limited, to chronic pancreatitis   with pseudocyst formation and IPMN (intraductal papillary mucinous neoplasm).  Large amount of stool within the proximal colon and diverticulosis coli,   without evidence of diverticulitis or colitis.      4.7 cm fluid collection ventral to the left hip.               Relevant labs and imaging reviewed    ASSESSMENT AND PLAN     AKI on CKD  Could be pre-renal since patient reports not eating, drinking given emesis for past few days  - foley placement for accurate I&Os  - trial of IVF  - IV anti-emetic  - avoid nephrotoxin to include prior exposure to acyclovir, lasix - unconfirmed on med list  - send urine lytes  - report that she is  known to Dr Maretta Bees - will consult     Anorexia, unable to tolerate PO intake - liquid and solids. No overt dysphagia described  Underweight  - uncertain etiology  - trial PPI  - trial PO diet  - if no better, consider GI eval ?    Constipation   - colace/senna/miralax    RUE swelling  RUE brachial artery pulsatile  - check venous doppler  - check arterial doppler for anerysm      Incidental findings on imaging :  - 4.7 cm fluid collection ventral to the left hip.    - Pancreatic atrophy with multiple intraparenchymal cystic masses.Differential diagnosis includes, but is not limited, to chronic pancreatitis with pseudocyst formation and IPMN (intraductal papillary mucinous neoplasm).      Reported left face/neck shingles at end of nov  - looks healed up    Hx of DCIS      Patient is unable to recall home med list. [name] dtr is not available to clarify. Will need to clarify home med when grand dtr returns    Case d/w ED physician    [place], Internal Medicine  09/16/2017 at 1:33 AM

## 2017-09-16 NOTE — Plan of Care (Signed)
Problem: Falls - Risk of:  Goal: Will remain free from falls  Will remain free from falls   Outcome: Ongoing

## 2017-09-16 NOTE — Progress Notes (Signed)
Hospitalist Progress Note      Name:  Judy Velasquez DOB/Age/Sex: 10-27-1928  (83 y.o. female)   MRN & CSN:  [id number] & [id number] Admission Date/Time: 09/16/2017 12:35 AM   Location:  4116/4116-A PCP: [name], MD         Hospital Day: 1    Assessment and Plan:   Judy Velasquez is a 83 y.o.  female      AKI on CKD  Could be pre-renal since patient reports not eating, drinking given emesis for past few days  - appreciate nephrology consult     Anorexia, unable to tolerate PO intake - liquid and solids. No overt dysphagia described  Underweight  - uncertain etiology  - trial PPI  - GI consult - having pain swallowing today     Constipation   - colace/senna/miralax     RUE swelling  RUE brachial artery pulsatile  - check venous doppler  - check arterial doppler for anerysm        Incidental findings on imaging :  - 4.7 cm fluid collection ventral to the left hip.  -ortho consult     - Pancreatic atrophy with multiple intraparenchymal cystic masses.Differential diagnosis includes, but is not limited, to chronic pancreatitis with pseudocyst formation and IPMN (intraductal papillary mucinous neoplasm).  -GI consult        Reported left face/neck shingles at end of nov  - looks healed up     Hx of DCIS        Patient is unable to recall home med list. Judy Velasquez dtr is not available to clarify. Will need to clarify home med when grand dtr returns      History of Present Illness:     No dyspnea at rest    Objective:     Intake/Output Summary (Last 24 hours) at 09/16/17 2100  Last data filed at 09/16/17 1648   Gross per 24 hour   Intake             1863 ml   Output                0 ml   Net             1863 ml      Vitals:   Vitals:    09/16/17 1721   BP: (!) 187/82   Pulse: 86   Resp: 16   Temp: 98.3 F (36.8 C)   SpO2: 98%     Physical Exam:      Physical Exam   Constitutional: She is oriented to person, place, and time. She appears well-developed.   Pulmonary/Chest: Effort normal.   Neurological: She is alert and  oriented to person, place, and time.         Medications:   Medications:   . heparin (porcine)  5,000 Units Subcutaneous Q8H   . docusate sodium  100 mg Oral BID   . senna  2 tablet Oral BID   . polyethylene glycol  17 g Oral Daily   . pantoprazole  40 mg Oral BID AC   . anastrozole  1 mg Oral Daily   . carvedilol  3.125 mg Oral BID   . hydrALAZINE  50 mg Oral TID   . iron polysaccharides  150 mg Oral BID      Infusions:   . sodium chloride 100 mL/hr at 09/16/17 0621     PRN Meds:   ondansetron 4 mg Q6H PRN   prochlorperazine 10  mg Q6H PRN         Electronically signed by Regino Bellow, MD on 09/16/2017 at 9:00 PM

## 2017-09-16 NOTE — Consults (Signed)
Nephrology Service Consultation        [address], [place] [address]  Phone: [phone]  Office Hours: 8:30AM - 4:30PM  Monday - Friday           Patient:  Judy Velasquez  MRN: [id number]  Consulting physician:  Vernie Ammons, MD  Reason for Consult: elevated cr      PCP: [name], MD    HISTORY OF PRESENT ILLNESS:   The patient is a 83 y.o. female with htn, osteoarthritis, blindness, hearing loss presented due to weeks of vomiting. Denies fever, denies diarrhea, she has not had a BM for 4 days  Renal consult for cr of 4.3 on admission up from her baseline of 2. She is a pt of Dr Maretta Bees  She is on room air, reports LUE sweling      REVIEW OF SYSTEMS:  14 point ROS is Negative. See positive ROS per HPI    Past Medical History:        Diagnosis Date   . Allergic rhinitis    . Anemia    . Anesthesia     Nausea/Vomiting Post Op In Past   . Anxiety    . Arthritis     s/p bilateral hip and right knee replacement sees Dr. Chriss Czar   . Back problem     "Occ Back Hurts"   . Blood transfusion    . CAD (coronary artery disease) 2008    Sees Dr. Brandt Loosen CABG x 3   . DCIS (ductal carcinoma in situ) 05/20/2011   . Glaucoma     Bilateral Eyes   . H/O cardiovascular stress test 05/20/11   . Heart attack ([place]) 2006   . HOH (hard of hearing)     Bilateral Ears   . Hyperlipidemia    . Hypertension    . IBS (irritable bowel syndrome)    . Kidney problem     "Sees  Dr. Maretta Bees For Blood Pressure And Kidneys"   . Nausea & vomiting     Nausea/Vomiting Post Op In Past   . Osteoarthritis    . Osteoporosis    . Right Breast Cancer Dx 7-12   . Shortness of breath on exertion    . Staph infection 1980's    "They sent me to a tropical infection doctor"   . TB (tuberculosis)     "Probably had it as a child, my dad had it"   . Type II or unspecified type diabetes mellitus without mention of complication, not stated as uncontrolled Dx 1980's       Past Surgical History:        Procedure Laterality Date   . BREAST  LUMPECTOMY  05/20/2011    Right   . BREAST SURGERY  7-12    Right Breast Biopsy   . CARDIAC SURGERY  2008    CABG (3 Bypasses)   . CARPAL TUNNEL RELEASE  2000's    Right   . CESAREAN SECTION  "Late 1950's or Early 1960's"    X 3   . CHOLECYSTECTOMY, LAPAROSCOPIC  1990's   . COLONOSCOPY  2000's   . CORONARY ARTERY BYPASS GRAFT  2008    cabg x 3   . ENDOSCOPY, COLON, DIAGNOSTIC  1990's   . HYSTERECTOMY, TOTAL ABDOMINAL  1960's   . JOINT REPLACEMENT  1980's    Total Right Hip   . JOINT REPLACEMENT  2000's    Total Right Hip   .  JOINT REPLACEMENT  1990's    Total Left Hip   . JOINT REPLACEMENT  1980's    Total Right Knee   . OTHER SURGICAL HISTORY      Family Physician Is Dr. Evelina Bucy In [place], [place]   . OTHER SURGICAL HISTORY  2000's    "Took section of my intestines out, it was a exploratory operation, they found scar tissue from the c-sections"   . TONSILLECTOMY AND ADENOIDECTOMY  1939       Medications:   Scheduled Meds:  . heparin (porcine)  5,000 Units Subcutaneous Q8H   . docusate sodium  100 mg Oral BID   . senna  2 tablet Oral BID   . polyethylene glycol  17 g Oral Daily   . pantoprazole  40 mg Oral BID AC     Continuous Infusions:  . sodium chloride 100 mL/hr at 09/16/17 0621     PRN Meds:.ondansetron, prochlorperazine    Allergies:  Celebrex [celecoxib]; Fish allergy; Lisinopril; Neurontin [gabapentin]; Norvasc [amlodipine besylate]; Other; Shellfish allergy; Ciprofloxacin; Lipitor; and Losartan    Social History:   TOBACCO:   reports that she has never smoked. She has never used smokeless tobacco.  ETOH:   reports that she does not drink alcohol.  OCCUPATION:      Family History:   Family History   Problem Relation Age of Onset   . Heart Disease Mother    . Cancer Mother         "Cancer In Her Eye"   . Depression Mother    . Mental Illness Mother    . Diabetes Father    . Heart Disease Father         "Heart Attack"   . Hearing Loss Father    . Cancer Sister         "Breast Cancer"   . Early Death  Brother         "At [employer]   . Diabetes Sister    . Vision Loss Son         "Eye Problems"   . Other Son         "Alot of health problems due to motorcycle wreck"   . Arthritis Daughter    . Early Death Son         "Early 65's"           Physical Exam:    Vitals: BP (!) 160/69   Pulse 82   Temp 98.2 F (36.8 C) (Oral)   Resp 16   Wt 95 lb (43.1 kg)   SpO2 97%   BMI 21.30 kg/m   General appearance: in no acute distress, appears stated age  Skin: Skin color, texture, turgor normal. No rashes or lesions  HEENT: normocephalic, atraumatic  Neck: supple, trachea midline  Lungs: clear to auscultation bilaterally, breathing comfortably  Heart:: regular rate and rhythm, S1, S2 normal,  Abdomen: soft, non-tender; bowel sounds normal; no masses, slight distension in the lower quadrants  Extremities: extremities normal, atraumatic, no cyanosis or edema  Neurologic: Mental status: alert, oriented, interactive, following commands  Psychiatric: mood and affect appropriate    CBC:   Recent Labs      09/15/17   1845  09/16/17   0228   WBC  7.9  7.0   HGB  10.0*  7.5*   PLT  393  279     BMP:    Recent Labs      09/15/17   1845  09/16/17   0228   NA  139  137   K  4.0  3.8   CL  94*  99   CO2  25  26   BUN  80*  70*   CREATININE  4.3*  3.9*   GLUCOSE  185*  134*     Hepatic:   Recent Labs      09/15/17   1845   AST  16   ALT  7*   BILITOT  0.3   ALKPHOS  95     Troponin: No results for input(s): TROPONINI in the last 72 hours.  BNP: No results for input(s): BNP in the last 72 hours.  Lipids: No results for input(s): CHOL, HDL in the last 72 hours.    Invalid input(s): LDLCALCU  ABGs: No results found for: PHART, PO2ART, PCO2ART  INR: No results for input(s): INR in the last 72 hours.  -----------------------------------------------------------------      Assessment and Recommendations     - AKI: cr peak of 4.3  - CKD4  - HTN  - Emesis  - Constipation  - 4.7cm fluid collection ventral to left hip    Plan:  AKI likely from  volume depletion in setting of emesis  Continue NS  No obstructive uropathy on ct a/p  Please renally dose meds  Will follow      Electronically signed by Lyda Kalata, DO on 09/16/2017 at 6:37 AM    [place], MD  Rennis Harding, DO  [address],  [place] [address]  PHONE: [phone]  FAX: [phone]

## 2017-09-17 ENCOUNTER — Inpatient Hospital Stay: Admit: 2017-09-17 | Payer: MEDICARE | Primary: Internal Medicine

## 2017-09-17 LAB — CBC WITH AUTO DIFFERENTIAL
Basophils %: 0.4 % (ref 0–1)
Basophils Absolute: 0 10*3/uL
Eosinophils %: 1.4 % (ref 0–3)
Eosinophils Absolute: 0.1 10*3/uL
Hematocrit: 22.9 % — ABNORMAL LOW (ref 37–47)
Hemoglobin: 7.2 GM/DL — ABNORMAL LOW (ref 12.5–16.0)
Immature Neutrophil %: 0.2 % (ref 0–0.43)
Lymphocytes %: 19.7 % — ABNORMAL LOW (ref 24–44)
Lymphocytes Absolute: 1.1 10*3/uL
MCH: 34.6 PG — ABNORMAL HIGH (ref 27–31)
MCHC: 31.4 % — ABNORMAL LOW (ref 32.0–36.0)
MCV: 110.1 FL — ABNORMAL HIGH (ref 78–100)
MPV: 9.7 FL (ref 7.5–11.1)
Monocytes %: 12.8 % — ABNORMAL HIGH (ref 0–4)
Monocytes Absolute: 0.7 10*3/uL
Nucleated RBC %: 0 %
Platelets: 277 10*3/uL (ref 140–440)
RBC: 2.08 10*6/uL — ABNORMAL LOW (ref 4.2–5.4)
RDW: 16.6 % — ABNORMAL HIGH (ref 11.7–14.9)
Segs Absolute: 3.7 10*3/uL
Segs Relative: 65.5 % (ref 36–66)
Total Immature Neutrophil: 0.01 10*3/uL
Total Nucleated RBC: 0 10*3/uL
WBC: 5.6 10*3/uL (ref 4.0–10.5)

## 2017-09-17 LAB — ANEMIA PANEL
Basophils %: 0.5 % (ref 0–1)
Basophils Absolute: 0 10*3/uL
Eosinophils %: 1.7 % (ref 0–3)
Eosinophils Absolute: 0.1 10*3/uL
Ferritin: 732 NG/ML — ABNORMAL HIGH (ref 15–150)
Folate: >20 NG/ML — ABNORMAL HIGH (ref 3.1–17.5)
Hematocrit: 24.8 % — ABNORMAL LOW (ref 37–47)
Hemoglobin: 8 GM/DL — ABNORMAL LOW (ref 12.5–16.0)
Immature Neutrophil %: 0.2 % (ref 0–0.43)
Iron: 44 ug/dL (ref 37–145)
Lymphocytes %: 19.8 % — ABNORMAL LOW (ref 24–44)
Lymphocytes Absolute: 1.3 10*3/uL
MCH: 35.1 PG — ABNORMAL HIGH (ref 27–31)
MCHC: 32.3 % (ref 32.0–36.0)
MCV: 108.8 FL — ABNORMAL HIGH (ref 78–100)
MPV: 9.1 FL (ref 7.5–11.1)
Monocytes %: 11.5 % — ABNORMAL HIGH (ref 0–4)
Monocytes Absolute: 0.8 10*3/uL
Nucleated RBC %: 0 %
Platelets: 280 10*3/uL (ref 140–440)
RBC: 2.28 10*6/uL — ABNORMAL LOW (ref 4.2–5.4)
RDW: 16.9 % — ABNORMAL HIGH (ref 11.7–14.9)
Retic Ct Pct: 3.6 % — ABNORMAL HIGH (ref 0.2–2.20)
Segs Absolute: 4.4 10*3/uL
Segs Relative: 66.3 % — ABNORMAL HIGH (ref 36–66)
TIBC: 98 ug/dL — ABNORMAL LOW (ref 250–450)
Total Immature Neutrophil: 0.01 10*3/uL
Total Nucleated RBC: 0 10*3/uL
Transferrin %: 45 % — ABNORMAL HIGH (ref 10–44)
UIBC: 54 ug/dL — ABNORMAL LOW (ref 110–370)
Vitamin B-12: 1480 pg/ml — ABNORMAL HIGH (ref 211–911)
WBC: 6.6 10*3/uL (ref 4.0–10.5)

## 2017-09-17 LAB — BASIC METABOLIC PANEL
Anion Gap: 13 (ref 4–16)
BUN: 63 MG/DL — ABNORMAL HIGH (ref 6–23)
CO2: 24 MMOL/L (ref 21–32)
Calcium: 7.5 MG/DL — ABNORMAL LOW (ref 8.3–10.6)
Chloride: 101 mMol/L (ref 99–110)
Creatinine: 3.6 MG/DL — ABNORMAL HIGH (ref 0.6–1.1)
GFR African American: 15 mL/min/{1.73_m2} — ABNORMAL LOW (ref >60)
GFR Non-African American: 12 mL/min/{1.73_m2} — ABNORMAL LOW (ref >60)
Glucose: 82 MG/DL (ref 70–99)
Potassium: 4 MMOL/L (ref 3.5–5.1)
Sodium: 138 MMOL/L (ref 135–145)

## 2017-09-17 LAB — PROTIME-INR
INR: 1.02 INDEX
Protime: 11.6 SECONDS (ref 9.12–12.5)

## 2017-09-17 LAB — MAGNESIUM: Magnesium: 2.6 mg/dl — ABNORMAL HIGH (ref 1.8–2.4)

## 2017-09-17 LAB — PERIOD AND VOLUME
Time (Hours): 24 h
Volume, (UVOL): 750 mL

## 2017-09-17 LAB — CRYSTALS, BODY FLUID: Crystals, Fluid: NONE SEEN

## 2017-09-17 LAB — IRON AND TIBC
Iron: 39 ug/dL (ref 37–145)
TIBC: 91 ug/dL — ABNORMAL LOW (ref 250–450)
Transferrin %: 43 % (ref 10–44)
UIBC: 52 ug/dL — ABNORMAL LOW (ref 110–370)

## 2017-09-17 LAB — PROTEIN, URINE, 24 HOUR: Protein, 24H Urine: 1643 MG/24 HR — ABNORMAL HIGH (ref <150)

## 2017-09-17 MED ORDER — NORMAL SALINE FLUSH 0.9 % IV SOLN
0.9 % | INTRAVENOUS | Status: DC | PRN
Start: 2017-09-17 — End: 2017-09-26
  Administered 2017-09-24: 02:00:00 10 mL via INTRAVENOUS

## 2017-09-17 MED ORDER — INFLUENZA VAC SPLIT QUAD 0.5 ML IM SUSY
0.5 ML | Freq: Once | INTRAMUSCULAR | Status: DC
Start: 2017-09-17 — End: 2017-09-26

## 2017-09-17 MED ORDER — IOVERSOL 68 % IJ SOLN
68 | INTRAMUSCULAR | Status: AC
Start: 2017-09-17 — End: 2017-09-17

## 2017-09-17 MED ORDER — MAGNESIUM CITRATE PO SOLN
Freq: Once | ORAL | Status: AC
Start: 2017-09-17 — End: 2017-09-17
  Administered 2017-09-17: 21:00:00 296 mL via ORAL

## 2017-09-17 MED ORDER — LIDOCAINE HCL (PF) 1 % IJ SOLN
1 | INTRAMUSCULAR | Status: AC
Start: 2017-09-17 — End: 2017-09-17

## 2017-09-17 MED ORDER — PHENOL 1.4 % MT LIQD
1.4 % | OROMUCOSAL | Status: DC | PRN
Start: 2017-09-17 — End: 2017-09-26
  Administered 2017-09-17 – 2017-09-19 (×2): 1 via OROMUCOSAL

## 2017-09-17 MED ORDER — BISACODYL 10 MG RE SUPP
10 MG | Freq: Once | RECTAL | Status: DC
Start: 2017-09-17 — End: 2017-09-26

## 2017-09-17 MED ORDER — DIPHENHYDRAMINE HCL 12.5 MG/5ML PO LIQD
12.55 MG/5ML | Freq: Four times a day (QID) | ORAL | Status: DC | PRN
Start: 2017-09-17 — End: 2017-09-26
  Administered 2017-09-19: 16:00:00 5 mL via ORAL

## 2017-09-17 MED ORDER — NORMAL SALINE FLUSH 0.9 % IV SOLN
0.9 | INTRAVENOUS | Status: AC
Start: 2017-09-17 — End: 2017-09-17

## 2017-09-17 MED ORDER — ACETAMINOPHEN 325 MG PO TABS
325 MG | ORAL | Status: DC | PRN
Start: 2017-09-17 — End: 2017-09-26
  Administered 2017-09-17 – 2017-09-20 (×6): 650 mg via ORAL

## 2017-09-17 MED ORDER — NORMAL SALINE FLUSH 0.9 % IV SOLN
0.9 % | Freq: Two times a day (BID) | INTRAVENOUS | Status: DC
Start: 2017-09-17 — End: 2017-09-26
  Administered 2017-09-18 – 2017-09-26 (×15): 10 mL via INTRAVENOUS

## 2017-09-17 MED FILL — PANTOPRAZOLE SODIUM 40 MG PO TBEC: 40 mg | ORAL | Qty: 1

## 2017-09-17 MED FILL — FERREX 150 150 MG PO CAPS: 150 mg | ORAL | Qty: 1

## 2017-09-17 MED FILL — POLYETHYLENE GLYCOL 3350 17 G PO PACK: 17 g | ORAL | Qty: 1

## 2017-09-17 MED FILL — SENNA-LAX 8.6 MG PO TABS: 8.6 mg | ORAL | Qty: 2

## 2017-09-17 MED FILL — DOK 100 MG PO CAPS: 100 mg | ORAL | Qty: 1

## 2017-09-17 MED FILL — GNP SORE THROAT SPRAY 1.4 % MT LIQD: 1.4 % | OROMUCOSAL | Qty: 177

## 2017-09-17 MED FILL — ANASTROZOLE 1 MG PO TABS: 1 mg | ORAL | Qty: 1

## 2017-09-17 MED FILL — PAIN & FEVER 325 MG PO TABS: 325 mg | ORAL | Qty: 2

## 2017-09-17 MED FILL — CARVEDILOL 3.125 MG PO TABS: 3.125 mg | ORAL | Qty: 1

## 2017-09-17 MED FILL — LIDOCAINE VISCOUS 2 % MT SOLN: 2 % | OROMUCOSAL | Qty: 1.67

## 2017-09-17 MED FILL — HYDRALAZINE HCL 50 MG PO TABS: 50 mg | ORAL | Qty: 1

## 2017-09-17 MED FILL — HEPARIN SODIUM (PORCINE) 5000 UNIT/ML IJ SOLN: 5000 [IU]/mL | INTRAMUSCULAR | Qty: 1

## 2017-09-17 MED FILL — SODIUM CHLORIDE 0.9 % IV SOLN: 0.9 % | INTRAVENOUS | Qty: 1000

## 2017-09-17 MED FILL — XYLOCAINE-MPF 1 % IJ SOLN: 1 % | INTRAMUSCULAR | Qty: 10

## 2017-09-17 MED FILL — OPTIRAY 320 68 % IJ SOLN: 68 % | INTRAMUSCULAR | Qty: 50

## 2017-09-17 MED FILL — ONDANSETRON HCL 4 MG/2ML IJ SOLN: 4 MG/2ML | INTRAMUSCULAR | Qty: 2

## 2017-09-17 MED FILL — NORMAL SALINE FLUSH 0.9 % IV SOLN: 0.9 % | INTRAVENOUS | Qty: 30

## 2017-09-17 MED FILL — MAGNESIUM CITRATE 1.745 GM/30ML PO SOLN: 1.745 GM/30ML | ORAL | Qty: 296

## 2017-09-17 NOTE — Consults (Addendum)
Consults     Department of Internal Medicine  Gastroenterology Consult Note  Judy Velasquez. Judy Canion MD      Reason for Consult:  odynophagia    Primary Care Physician:  [name], MD    History Obtained From:  patient    HISTORY OF PRESENT ILLNESS:              The patient is a 83 y.o.  female admitted with odynophagia, anorexia and inability to eat for several days. CT done and showed a thickened esophagus and atrophic pancreas containing cystic areas. She denies abd pain or weight loss and says that her gi complaints only started a few days ago. She has had a colonoscopy and EGD in the remote past but cannot remember when or by whom    Past Medical History:        Diagnosis Date   . Allergic rhinitis    . Anemia    . Anesthesia     Nausea/Vomiting Post Op In Past   . Anxiety    . Arthritis     s/p bilateral hip and right knee replacement sees Dr. Chriss Czar   . Back problem     "Occ Back Hurts"   . Blood transfusion    . CAD (coronary artery disease) 2008    Sees Dr. Brandt Loosen CABG x 3   . DCIS (ductal carcinoma in situ) 05/20/2011   . Glaucoma     Bilateral Eyes   . H/O cardiovascular stress test 05/20/11   . Heart attack ([place]) 2006   . HOH (hard of hearing)     Bilateral Ears   . Hyperlipidemia    . Hypertension    . IBS (irritable bowel syndrome)    . Kidney problem     "Sees  Dr. Maretta Bees For Blood Pressure And Kidneys"   . Nausea & vomiting     Nausea/Vomiting Post Op In Past   . Osteoarthritis    . Osteoporosis    . Right Breast Cancer Dx 7-12   . Shortness of breath on exertion    . Staph infection 1980's    "They sent me to a tropical infection doctor"   . TB (tuberculosis)     "Probably had it as a child, my dad had it"   . Type II or unspecified type diabetes mellitus without mention of complication, not stated as uncontrolled Dx 1980's       Past Surgical History:        Procedure Laterality Date   . BREAST LUMPECTOMY  05/20/2011    Right   . BREAST SURGERY  7-12    Right Breast Biopsy   . CARDIAC SURGERY  2008     CABG (3 Bypasses)   . CARPAL TUNNEL RELEASE  2000's    Right   . CESAREAN SECTION  "Late 1950's or Early 1960's"    X 3   . CHOLECYSTECTOMY, LAPAROSCOPIC  1990's   . COLONOSCOPY  2000's   . CORONARY ARTERY BYPASS GRAFT  2008    cabg x 3   . ENDOSCOPY, COLON, DIAGNOSTIC  1990's   . HYSTERECTOMY, TOTAL ABDOMINAL  1960's   . JOINT REPLACEMENT  1980's    Total Right Hip   . JOINT REPLACEMENT  2000's    Total Right Hip   . JOINT REPLACEMENT  1990's    Total Left Hip   . JOINT REPLACEMENT  1980's    Total Right Knee   . OTHER  SURGICAL HISTORY      Family Physician Is Dr. Evelina Bucy In [place], [place]   . OTHER SURGICAL HISTORY  2000's    "Took section of my intestines out, it was a exploratory operation, they found scar tissue from the c-sections"   . TONSILLECTOMY AND ADENOIDECTOMY  1939       Medications Prior to Admission:    Prior to Admission medications    Medication Sig Start Date End Date Taking? Authorizing Provider   hydrALAZINE (APRESOLINE) 50 MG tablet Take 1 tablet by mouth 3 times daily One and one half Tablets Twice Daily 06/19/17  Yes Noe Gens, MD   iron polysaccharides (FERREX 150) 150 MG capsule Take 150 mg by mouth 2 times daily   Yes Historical Provider, MD   naphazoline-pheniramine (NAPHCON-A) 0.025-0.3 % ophthalmic solution 1 drop 4 times daily   Yes Historical Provider, MD   B Complex-Biotin-FA (SUPER B-50 B COMPLEX PO) Take by mouth   Yes Historical Provider, MD   fluticasone (FLONASE) 50 MCG/ACT nasal spray 1 spray by Nasal route daily   Yes Historical Provider, MD   anastrozole (ARIMIDEX) 1 MG tablet Take 1 tablet by mouth daily 04/21/14  Yes Maudie Mercury, MD   furosemide (LASIX) 40 MG tablet Take 40 mg by mouth daily    Yes Historical Provider, MD   carvedilol (COREG) 6.25 MG tablet Take 3.125 mg by mouth 2 times daily    Yes Historical Provider, MD   clindamycin (CLEOCIN) 150 MG capsule Take 150 mg by mouth 3 times daily    Historical Provider, MD   acyclovir (ZOVIRAX) 800 MG  tablet Take 800 mg by mouth 4 times daily    Historical Provider, MD   predniSONE (DELTASONE) 20 MG tablet Take 20 mg by mouth daily Two tabs daily    Historical Provider, MD   diltiazem (TIAZAC) 240 MG SR capsule Take 240 mg by mouth every evening.      Historical Provider, MD       Allergies:    Allergies   Allergen Reactions   . Celebrex [Celecoxib] Shortness Of Breath, Swelling and Anaphylaxis     "Swelling Of The Esophagus"   . Fish Allergy Anaphylaxis   . Lisinopril Other (See Comments)     Dry Mouth   . Neurontin [Gabapentin] Other (See Comments)     Dizziness   . Norvasc [Amlodipine Besylate] Other (See Comments)     Edema Of Legs   . Other      "Allergic To Certain Seafoods Causing Headaches, Difficulty Breathing"   . Shellfish Allergy Anaphylaxis   . Ciprofloxacin Rash   . Lipitor Other (See Comments)     Ache   . Losartan      Per nephrologist   .    Social History:    TOBACCO:  No  ETOH:  No    Family History:   Family History   Problem Relation Age of Onset   . Heart Disease Mother    . Cancer Mother         "Cancer In Her Eye"   . Depression Mother    . Mental Illness Mother    . Diabetes Father    . Heart Disease Father         "Heart Attack"   . Hearing Loss Father    . Cancer Sister         "Breast Cancer"   . Early Death Brother         "  At [facility]"   . Diabetes Sister    . Vision Loss Son         "Eye Problems"   . Other Son         "Alot of health problems due to motorcycle wreck"   . Arthritis Daughter    . Early Death Son         "Early 50's"       REVIEW OF SYSTEMS: (POSITIVES WILL BE UNDERLINED)  CONSTITUTIONAL:    Weight change,fatigue, fever, chills  EYES:  Diplopia, change in vision  EARS:  hearing loss, tinnitus, vertigo  NOSE:   epistaxis  MOUTH/THROAT:     hoarseness, sore throat.  RESPIRATORY:  SOB,  cough, sputum, hemoptysis  CARDIOVASCULAR : chest pain,palpitations, dyspnea exertion, orthopnea, paroxysmal nocturnal dyspnea, pedal edema.  GASTROINTESTINAL:  See HPI  GENITOURINARY:    dysuria, hematuria, .  HEMATOLOGIC/LYMPHATIC:   Anemia, bleeding tendencies.  MUSCULOSKELETAL:    myalgias,  joint pain  NEUROLOGICAL:   Loss of Consciousness, paresthesias, anesthesias, focal weakness  SKIN :   History of dermatitis, rashes  PSYCHIATRIC:  depression, , anxiety past psychosis  ENDOCRINE:  History of diabetes, thyroid disease  ALL/IMM : allergies, rashes    PHYSICAL EXAM:    Vitals:  BP (!) 193/84   Pulse 79   Temp 98.1 F (36.7 C) (Oral)   Resp 16   Ht 4' 9.24" (1.454 m)   Wt 95 lb (43.1 kg)   SpO2 97%   BMI 20.38 kg/m   CONSTITUTIONAL: alert, cooperative, no apparent distress,   EYES:  pupils equal, round and reactive to light and sclera clear  ENT:  normocepalic, without obvious abnormality  NECK:  supple, symmetrical, trachea midline  HEMATOLOGIC/LYMPHATICS:  no cervical lymphadenopathy and no supraclavicular lymphadenopathy  LUNGS:  clear to auscultation  CARDIOVASCULAR:  regular rate and rhythm and no murmur noted  ABDOMEN:  Soft, non-tender with normal bowel sounds. No organomegaly or masses  NEUROLOGIC: no focal deficit detected  SKIN:  no lesions  EXTREMITIES: no clubbing, cyanosis, or edema    IMPRESSION:  1) anorexia, odynophagia, thickened esophagus- prob esophagitis- ? Peptic, infectious or neoplastic  2) cystic lesions in pancreas- likely asymptomatic- would just follow that for now    RECOMMENDATIONS:  1) EGD tomorrow  2) check CA 19-9        Judy Velasquez.D

## 2017-09-17 NOTE — Plan of Care (Signed)
Problem: Falls - Risk of:  Goal: Will remain free from falls  Will remain free from falls   Outcome: Ongoing    Goal: Absence of physical injury  Absence of physical injury   Outcome: Ongoing

## 2017-09-17 NOTE — Anesthesia Pre-Procedure Evaluation (Signed)
Department of Anesthesiology  Preprocedure Note       Name:  Judy Velasquez   Age:  83 y.o.  DOB:  04-02-1929                                          MRN:  [id number]         Date:  09/17/2017      Surgeon: Juliann Mule):  [name], MD    Procedure: EGD ESOPHAGOGASTRODUODENOSCOPY (N/A )    Medications prior to admission:   Prior to Admission medications    Medication Sig Start Date End Date Taking? Authorizing Provider   hydrALAZINE (APRESOLINE) 50 MG tablet Take 1 tablet by mouth 3 times daily One and one half Tablets Twice Daily 06/19/17  Yes Noe Gens, MD   iron polysaccharides (FERREX 150) 150 MG capsule Take 150 mg by mouth 2 times daily   Yes Historical Provider, MD   naphazoline-pheniramine (NAPHCON-A) 0.025-0.3 % ophthalmic solution 1 drop 4 times daily   Yes Historical Provider, MD   B Complex-Biotin-FA (SUPER B-50 B COMPLEX PO) Take by mouth   Yes Historical Provider, MD   fluticasone (FLONASE) 50 MCG/ACT nasal spray 1 spray by Nasal route daily   Yes Historical Provider, MD   anastrozole (ARIMIDEX) 1 MG tablet Take 1 tablet by mouth daily 04/21/14  Yes Maudie Mercury, MD   furosemide (LASIX) 40 MG tablet Take 40 mg by mouth daily    Yes Historical Provider, MD   carvedilol (COREG) 6.25 MG tablet Take 3.125 mg by mouth 2 times daily    Yes Historical Provider, MD   clindamycin (CLEOCIN) 150 MG capsule Take 150 mg by mouth 3 times daily    Historical Provider, MD   acyclovir (ZOVIRAX) 800 MG tablet Take 800 mg by mouth 4 times daily    Historical Provider, MD   predniSONE (DELTASONE) 20 MG tablet Take 20 mg by mouth daily Two tabs daily    Historical Provider, MD   diltiazem (TIAZAC) 240 MG SR capsule Take 240 mg by mouth every evening.      Historical Provider, MD       Current medications:    Current Facility-Administered Medications   Medication Dose Route Frequency Provider Last Rate Last Dose   . [START ON 09/18/2017] influenza quadrivalent split vaccine (FLUZONE;FLUARIX;FLULAVAL;AFLURIA)  injection 0.5 mL  0.5 mL Intramuscular Once Regino Bellow, MD       . sodium chloride flush 0.9 % injection 10 mL  10 mL Intravenous 2 times per day Regino Bellow, MD       . sodium chloride flush 0.9 % injection 10 mL  10 mL Intravenous PRN Regino Bellow, MD       . bisacodyl (DULCOLAX) suppository 10 mg  10 mg Rectal Once Regino Bellow, MD       . phenol 1.4 % mouth spray 1 spray  1 spray Mouth/Throat Q2H PRN Regino Bellow, MD   1 spray at 09/17/17 1839   . magic (miracle) mouthwash  5 mL Swish & Spit 4x Daily PRN Regino Bellow, MD       . heparin (porcine) injection 5,000 Units  5,000 Units Subcutaneous Q8H Vernie Ammons, MD   5,000 Units at 09/17/17 1323   . docusate sodium (COLACE) capsule 100 mg  100 mg Oral BID Vernie Ammons, MD  100 mg at 09/17/17 0857   . senna (SENOKOT) tablet 17.2 mg  2 tablet Oral BID Vernie Ammons, MD   17.2 mg at 09/17/17 0857   . polyethylene glycol (GLYCOLAX) packet 17 g  17 g Oral Daily Vernie Ammons, MD   17 g at 09/17/17 0857   . pantoprazole (PROTONIX) tablet 40 mg  40 mg Oral BID AC Vernie Ammons, MD   40 mg at 09/17/17 1607   . 0.9 % sodium chloride infusion   Intravenous Continuous Adeleye Annick Edon, DO 40 mL/hr at 09/17/17 1035     . ondansetron (ZOFRAN) injection 4 mg  4 mg Intravenous Q6H PRN Vernie Ammons, MD   4 mg at 09/17/17 1823   . prochlorperazine (COMPAZINE) injection 10 mg  10 mg Intravenous Q6H PRN Vernie Ammons, MD       . anastrozole (ARIMIDEX) tablet 1 mg  1 mg Oral Daily Regino Bellow, MD   1 mg at 09/17/17 0857   . carvedilol (COREG) tablet 3.125 mg  3.125 mg Oral BID Regino Bellow, MD   3.125 mg at 09/17/17 0857   . hydrALAZINE (APRESOLINE) tablet 50 mg  50 mg Oral TID Regino Bellow, MD   50 mg at 09/17/17 1326   . iron polysaccharides (NIFEREX) capsule 150 mg  150 mg Oral BID Regino Bellow, MD   150 mg at 09/17/17 0857   . acetaminophen (TYLENOL) tablet 650 mg  650 mg Oral Q4H PRN Renard Matter, MD   650 mg at  09/17/17 1620       Allergies:    Allergies   Allergen Reactions   . Celebrex [Celecoxib] Shortness Of Breath, Swelling and Anaphylaxis     "Swelling Of The Esophagus"   . Fish Allergy Anaphylaxis   . Lisinopril Other (See Comments)     Dry Mouth   . Neurontin [Gabapentin] Other (See Comments)     Dizziness   . Norvasc [Amlodipine Besylate] Other (See Comments)     Edema Of Legs   . Other      "Allergic To Certain Seafoods Causing Headaches, Difficulty Breathing"   . Shellfish Allergy Anaphylaxis   . Ciprofloxacin Rash   . Lipitor Other (See Comments)     Ache   . Losartan      Per nephrologist       Problem List:    Patient Active Problem List   Diagnosis Code   . Type II or unspecified type diabetes mellitus without mention of complication, not stated as uncontrolled E11.9   . Shortness of breath on exertion R06.02   . Anemia of chronic disease D63.8   . Kidney problem N28.9   . CAD (coronary artery disease) I25.10   . Arthritis M19.90   . Chest pain R07.9   . Essential hypertension I10   . PAF (paroxysmal atrial fibrillation) ([facility]) I48.0   . Stage 4 chronic kidney disease ([place]) N18.4   . ARF (acute renal failure) ([facility]) N17.9   . AKI (acute kidney injury) ([place]) N17.9       Past Medical History:        Diagnosis Date   . Allergic rhinitis    . Anemia    . Anesthesia     Nausea/Vomiting Post Op In Past   . Anxiety    . Arthritis     s/p bilateral hip and right knee replacement sees Dr. Chriss Czar   . Back problem     "Occ Back Hurts"   .  Blood transfusion    . CAD (coronary artery disease) 2008    Sees Dr. Brandt Loosen CABG x 3   . DCIS (ductal carcinoma in situ) 05/20/2011   . Glaucoma     Bilateral Eyes   . H/O cardiovascular stress test 05/20/11   . Heart attack ([place]) 2006   . HOH (hard of hearing)     Bilateral Ears   . Hyperlipidemia    . Hypertension    . IBS (irritable bowel syndrome)    . Kidney problem     "Sees  Dr. Maretta Bees For Blood Pressure And Kidneys"   . Nausea & vomiting     Nausea/Vomiting Post Op In Past   .  Osteoarthritis    . Osteoporosis    . Right Breast Cancer Dx 7-12   . Shortness of breath on exertion    . Staph infection 1980's    "They sent me to a tropical infection doctor"   . TB (tuberculosis)     "Probably had it as a child, my dad had it"   . Type II or unspecified type diabetes mellitus without mention of complication, not stated as uncontrolled Dx 1980's       Past Surgical History:        Procedure Laterality Date   . BREAST LUMPECTOMY  05/20/2011    Right   . BREAST SURGERY  7-12    Right Breast Biopsy   . CARDIAC SURGERY  2008    CABG (3 Bypasses)   . CARPAL TUNNEL RELEASE  2000's    Right   . CESAREAN SECTION  "Late 1950's or Early 1960's"    X 3   . CHOLECYSTECTOMY, LAPAROSCOPIC  1990's   . COLONOSCOPY  2000's   . CORONARY ARTERY BYPASS GRAFT  2008    cabg x 3   . ENDOSCOPY, COLON, DIAGNOSTIC  1990's   . HYSTERECTOMY, TOTAL ABDOMINAL  1960's   . JOINT REPLACEMENT  1980's    Total Right Hip   . JOINT REPLACEMENT  2000's    Total Right Hip   . JOINT REPLACEMENT  1990's    Total Left Hip   . JOINT REPLACEMENT  1980's    Total Right Knee   . OTHER SURGICAL HISTORY      Family Physician Is Dr. Evelina Bucy In [place], [place]   . OTHER SURGICAL HISTORY  2000's    "Took section of my intestines out, it was a exploratory operation, they found scar tissue from the c-sections"   . TONSILLECTOMY AND ADENOIDECTOMY  1939       Social History:    Social History   Substance Use Topics   . Smoking status: Never Smoker   . Smokeless tobacco: Never Used   . Alcohol use No                                Counseling given: Not Answered      Vital Signs (Current):   Vitals:    09/17/17 0900 09/17/17 1048 09/17/17 1553 09/17/17 1630   BP:  (!) 171/74 (!) 198/86 (!) 178/74   Pulse:  63 75 67   Resp:  _0 Temp:  36.8 C (98.2 F) 35.9 C (96.6 F) 36.1 C (96.9 F)   TempSrc:  Oral Oral Tympanic   SpO2:  97% 99% 100%   Weight: 95 lb (43.1 kg)  Height: 4' 9.24" (1.454 m)                                                  BP Readings from Last 3 Encounters:   09/17/17 (!) 178/74   09/15/17 (!) 125/55   08/18/17 (!) 209/58       NPO Status:                                                                                 BMI:   Wt Readings from Last 3 Encounters:   09/17/17 95 lb (43.1 kg)   09/15/17 95 lb (43.1 kg)   08/18/17 95 lb (43.1 kg)     Body mass index is 20.38 kg/m.    CBC:   Lab Results   Component Value Date    WBC 6.6 09/17/2017    RBC 2.28 09/17/2017    HGB 8.0 09/17/2017    HCT 24.8 09/17/2017    MCV 108.8 09/17/2017    RDW 16.9 09/17/2017    PLT 280 09/17/2017       CMP:   Lab Results   Component Value Date    NA 138 09/17/2017    K 4.0 09/17/2017    CL 101 09/17/2017    CO2 24 09/17/2017    BUN 63 09/17/2017    CREATININE 3.6 09/17/2017    GFRAA 15 09/17/2017    LABGLOM 12 09/17/2017    GLUCOSE 82 09/17/2017    PROT 6.5 09/15/2017    PROT 6.5 04/20/2010    CALCIUM 7.5 09/17/2017    BILITOT 0.3 09/15/2017    ALKPHOS 95 09/15/2017    AST 16 09/15/2017    ALT 7 09/15/2017       POC Tests: No results for input(s): POCGLU, POCNA, POCK, POCCL, POCBUN, POCHEMO, POCHCT in the last 72 hours.    Coags:   Lab Results   Component Value Date    PROTIME 11.6 09/17/2017    PROTIME 11.8 10/23/2009    INR 1.02 09/17/2017    APTT 49.3 04/25/2017       HCG (If Applicable): No results found for: PREGTESTUR, PREGSERUM, HCG, HCGQUANT     ABGs: No results found for: PHART, PO2ART, PCO2ART, HCO3ART, BEART, O2SATART     Type & Screen (If Applicable):  No results found for: LABABO, [place]    Anesthesia Evaluation     history of anesthetic complications: PONV.  Airway: Mallampati: II  TM distance: >3 FB   Neck ROM: full  Mouth opening: > = 3 FB Dental:          Pulmonary:                              Cardiovascular:  Exercise tolerance: poor (<4 METS),   (+) hypertension:, past MI:, CAD: obstructive, CABG/stent:, DOE:,                   Neuro/Psych:   Negative Neuro/Psych ROS  GI/Hepatic/Renal:   (+) renal disease: CRI,            Endo/Other:    (+) DiabetesType II DM, well controlled, , .                 Abdominal:           Vascular: negative vascular ROS.                                       Anesthesia Plan      MAC     ASA 3             Anesthetic plan and risks discussed with patient.                      Ashok Cordia, APRN - CRNA   09/17/2017

## 2017-09-17 NOTE — Progress Notes (Signed)
Nephrology Progress Note        [address], [place] [address]  Phone: [phone]  Office Hours: 8:30AM - 4:30PM  Monday - Friday       [facility]  Claretta Fraise, MD  [address] DO  [place],  [place] [address]  PHONE: [phone]  FAX: [phone]    09/17/2017 7:43 AM  Subjective:   Admit Date: 09/16/2017  PCP: [name], MD  Interval History: on room air  No more vomiting  Some pain with swallowing     Diet: DIET CARDIAC; Low Sodium (2 GM)      Data:   Scheduled Meds:  . heparin (porcine)  5,000 Units Subcutaneous Q8H   . docusate sodium  100 mg Oral BID   . senna  2 tablet Oral BID   . polyethylene glycol  17 g Oral Daily   . pantoprazole  40 mg Oral BID AC   . anastrozole  1 mg Oral Daily   . carvedilol  3.125 mg Oral BID   . hydrALAZINE  50 mg Oral TID   . iron polysaccharides  150 mg Oral BID     Continuous Infusions:  . sodium chloride 100 mL/hr at 09/16/17 0621     PRN Meds:ondansetron, prochlorperazine, acetaminophen  I/O last 3 completed shifts:  In: 3063 [P.O.:600; I.V.:2463]  Out: -   No intake/output data recorded.    Intake/Output Summary (Last 24 hours) at 09/17/17 0743  Last data filed at 09/17/17 0507   Gross per 24 hour   Intake             3063 ml   Output                0 ml   Net             3063 ml       CBC:   Recent Labs      09/16/17   0228  09/16/17   0627  09/17/17   0540   WBC  7.0  7.5  5.6   HGB  7.5*  8.5*  7.2*   PLT  279  333  277       BMP:  Recent Labs      09/16/17   0228  09/16/17   0627  09/17/17   0540   NA  137  140  138   K  3.8  4.2  4.0   CL  99  101  101   CO2  26  24  24    BUN  70*  69*  63*   CREATININE  3.9*  3.6*  3.6*   GLUCOSE  134*  89  82     Hepatic: Recent Labs      09/15/17   1845   AST  16   ALT  7*   BILITOT  0.3   ALKPHOS  95     Troponin: No results for input(s): TROPONINI in the last 72 hours.  BNP: No results for input(s): BNP in the last 72  hours.  Lipids: No results for input(s): CHOL, HDL in the last 72 hours.    Invalid input(s): LDLCALCU  ABGs: No results found for: PHART, PO2ART, PCO2ART  INR: No results for input(s): INR in the last 72 hours.    Objective:   Vitals: BP (!) 193/84   Pulse 79  Temp 98.1 F (36.7 C) (Oral)   Resp 16   Wt 95 lb (43.1 kg)   SpO2 97%   BMI 21.30 kg/m   General appearance: alert and cooperative with exam, in no acute distress  HEENT: normocephalic, atraumatic,   Neck: supple, trachea midline  Lungs: , breathing comfortably on room air  Heart:: regular rate and rhythm,   Abdomen: soft, non-tender;   Extremities: extremities atraumatic, no cyanosis or edema  Neurologic: alert, oriented, follows commands, interactive    Assessment and Plan:   - AKI: cr peak of 4.3, from volume depletion in setting of emesis, No obstructive uropathy on ct a/p  - CKD4  - HTN  - Emesis/Esophagitis  - Constipation  - 4.7cm fluid collection ventral to left hip    Plan:  Cr trending down  Decrease NS to 52ml/hr  No signs of iron deficiency per iron profile  Please renally dose meds  Will follow                    Electronically signed by Lyda Kalata, DO on 09/17/2017 at 7:43 AM    [facility]  Claretta Fraise, MD  Rennis Harding, DO  [place],  [place] [address]  PHONE: [phone]  FAX: [phone]

## 2017-09-17 NOTE — Progress Notes (Signed)
Hospitalist Progress Note      Name:  Judy Velasquez DOB/Age/Sex: 1928-10-25  (83 y.o. female)   MRN & CSN:  [id number] & [id number] Admission Date/Time: 09/16/2017 12:35 AM   Location:  4116/4116-A PCP: [name], MD         Hospital Day: 2    Assessment and Plan:   [name] is a 83 y.o.  female    Pt's hgb today is 7.2 - yesterday it was 8.5.       RE: Judy Velasquez   RM: 0539   pt has not had BM in a week or more she states she cant remember exactly, gave senekot and miralax this a.m. and still no BM. her abd is distended and very hard, laxatives ordered            AKI on CKD  Could be pre-renal since patient reports not eating, drinking given emesis for past few days  - appreciate nephrology consult     Anorexia, unable to tolerate PO intake, odynophagai - liquid and solids. No overt dysphagia described  Underweight  - uncertain etiology  - trial PPI  - GI consult - EGD am    Left hip fluid collection - aspirated in IR today     Constipation   - colace/senna/miralax     RUE swelling  RUE brachial artery pulsatile  - check venous doppler  - check arterial doppler for anerysm        Incidental findings on imaging :  - 4.7 cm fluid collection ventral to the left hip.  -ortho consult     - Pancreatic atrophy with multiple intraparenchymal cystic masses.Differential diagnosis includes, but is not limited, to chronic pancreatitis with pseudocyst formation and IPMN (intraductal papillary mucinous neoplasm).  -GI consult        Reported left face/neck shingles at end of nov  - looks healed up     Hx of DCIS        Patient is unable to recall home med list. [name] dtr is not available to clarify. Will need to clarify home med when grand dtr returns      CQI        History of Present Illness:     Having pain while swallowing    Objective:       Intake/Output Summary (Last 24 hours) at 09/17/17 0951  Last data filed at 09/17/17 0507   Gross per 24 hour   Intake             3063 ml   Output                0 ml    Net             3063 ml      Vitals:   Vitals:    09/17/17 0550   BP: (!) 193/84   Pulse: 79   Resp: 16   Temp: 98.1 F (36.7 C)   SpO2: 97%     Physical Exam:      Physical Exam   Constitutional: She is oriented to person, place, and time. She appears well-developed.   Pulmonary/Chest: Effort normal.   Neurological: She is alert and oriented to person, place, and time.         Medications:   Medications:   . heparin (porcine)  5,000 Units Subcutaneous Q8H   . docusate sodium  100 mg Oral BID   . senna  2 tablet Oral BID   .  polyethylene glycol  17 g Oral Daily   . pantoprazole  40 mg Oral BID AC   . anastrozole  1 mg Oral Daily   . carvedilol  3.125 mg Oral BID   . hydrALAZINE  50 mg Oral TID   . iron polysaccharides  150 mg Oral BID      Infusions:   . sodium chloride 40 mL/hr at 09/17/17 0856     PRN Meds:     ondansetron 4 mg Q6H PRN   prochlorperazine 10 mg Q6H PRN   acetaminophen 650 mg Q4H PRN         Electronically signed by Regino Bellow, MD on 09/17/2017 at 9:51 AM

## 2017-09-17 NOTE — Progress Notes (Signed)
Pt complaining of throat pain. Had one occurrence of emesis, given zofran. Will medicate with pain medications when available. Dr. Maretta Bees aware and placed orders. Will continue to monitor.  Ann Held, BSN, RN

## 2017-09-17 NOTE — Consults (Signed)
ORTHOPEDIC CONSULT      09/17/2017    Patient name: Judy Velasquez  DOB: 07-Nov-1928    CHIEF COMPLAINT  No chief complaint on file.      HPI  The patient was seen and examined. Judy Velasquez is a 83 y.o. female who presents with multiple medical comorbidities including emesis, anorexia, AKI and constipation. Patient being managed by Internal Medicine, GI and Nephrology. Orthopedics consulted for concern of incidental finding of fluid collection on CT ventral left hip. Patient is a poor historian. She notes that has a history of bilateral hip total arthoplasties but cannot recall the date of her most recent surgery by Dr. Grandville Silos. She notes that she ambulates daily but limits it due to pain in right hip occasionally. She denies fevers, chills or swelling/erythema to left hip.     PAST MEDICAL HISTORY  Past Medical History:   Diagnosis Date   . Allergic rhinitis    . Anemia    . Anesthesia     Nausea/Vomiting Post Op In Past   . Anxiety    . Arthritis     s/p bilateral hip and right knee replacement sees Dr. Chriss Czar   . Back problem     "Occ Back Hurts"   . Blood transfusion    . CAD (coronary artery disease) 2008    Sees Dr. Brandt Loosen CABG x 3   . DCIS (ductal carcinoma in situ) 05/20/2011   . Glaucoma     Bilateral Eyes   . H/O cardiovascular stress test 05/20/11   . Heart attack ([place]) 2006   . HOH (hard of hearing)     Bilateral Ears   . Hyperlipidemia    . Hypertension    . IBS (irritable bowel syndrome)    . Kidney problem     "Sees  Dr. Maretta Bees For Blood Pressure And Kidneys"   . Nausea & vomiting     Nausea/Vomiting Post Op In Past   . Osteoarthritis    . Osteoporosis    . Right Breast Cancer Dx 7-12   . Shortness of breath on exertion    . Staph infection 1980's    "They sent me to a tropical infection doctor"   . TB (tuberculosis)     "Probably had it as a child, my dad had it"   . Type II or unspecified type diabetes mellitus without mention of complication, not stated as uncontrolled Dx 1980's       CURRENT  MEDICATIONS  Prior to Admission medications    Medication Sig Start Date End Date Taking? Authorizing Provider   hydrALAZINE (APRESOLINE) 50 MG tablet Take 1 tablet by mouth 3 times daily One and one half Tablets Twice Daily 06/19/17  Yes Noe Gens, MD   iron polysaccharides (FERREX 150) 150 MG capsule Take 150 mg by mouth 2 times daily   Yes Historical Provider, MD   naphazoline-pheniramine (NAPHCON-A) 0.025-0.3 % ophthalmic solution 1 drop 4 times daily   Yes Historical Provider, MD   B Complex-Biotin-FA (SUPER B-50 B COMPLEX PO) Take by mouth   Yes Historical Provider, MD   fluticasone (FLONASE) 50 MCG/ACT nasal spray 1 spray by Nasal route daily   Yes Historical Provider, MD   anastrozole (ARIMIDEX) 1 MG tablet Take 1 tablet by mouth daily 04/21/14  Yes Maudie Mercury, MD   furosemide (LASIX) 40 MG tablet Take 40 mg by mouth daily    Yes Historical Provider, MD   carvedilol (COREG) 6.25 MG tablet  Take 3.125 mg by mouth 2 times daily    Yes Historical Provider, MD   clindamycin (CLEOCIN) 150 MG capsule Take 150 mg by mouth 3 times daily    Historical Provider, MD   acyclovir (ZOVIRAX) 800 MG tablet Take 800 mg by mouth 4 times daily    Historical Provider, MD   predniSONE (DELTASONE) 20 MG tablet Take 20 mg by mouth daily Two tabs daily    Historical Provider, MD   diltiazem (TIAZAC) 240 MG SR capsule Take 240 mg by mouth every evening.      Historical Provider, MD       ALLERGIES  Allergies   Allergen Reactions   . Celebrex [Celecoxib] Shortness Of Breath, Swelling and Anaphylaxis     "Swelling Of The Esophagus"   . Fish Allergy Anaphylaxis   . Lisinopril Other (See Comments)     Dry Mouth   . Neurontin [Gabapentin] Other (See Comments)     Dizziness   . Norvasc [Amlodipine Besylate] Other (See Comments)     Edema Of Legs   . Other      "Allergic To Certain Seafoods Causing Headaches, Difficulty Breathing"   . Shellfish Allergy Anaphylaxis   . Ciprofloxacin Rash   . Lipitor Other (See Comments)      Ache   . Losartan      Per nephrologist       SURGICAL HISTORY  Past Surgical History:   Procedure Laterality Date   . BREAST LUMPECTOMY  05/20/2011    Right   . BREAST SURGERY  7-12    Right Breast Biopsy   . CARDIAC SURGERY  2008    CABG (3 Bypasses)   . CARPAL TUNNEL RELEASE  2000's    Right   . CESAREAN SECTION  "Late 1950's or Early 1960's"    X 3   . CHOLECYSTECTOMY, LAPAROSCOPIC  1990's   . COLONOSCOPY  2000's   . CORONARY ARTERY BYPASS GRAFT  2008    cabg x 3   . ENDOSCOPY, COLON, DIAGNOSTIC  1990's   . HYSTERECTOMY, TOTAL ABDOMINAL  1960's   . JOINT REPLACEMENT  1980's    Total Right Hip   . JOINT REPLACEMENT  2000's    Total Right Hip   . JOINT REPLACEMENT  1990's    Total Left Hip   . JOINT REPLACEMENT  1980's    Total Right Knee   . OTHER SURGICAL HISTORY      Family Physician Is Dr. Evelina Bucy In [place], [place]   . OTHER SURGICAL HISTORY  2000's    "Took section of my intestines out, it was a exploratory operation, they found scar tissue from the c-sections"   . TONSILLECTOMY AND ADENOIDECTOMY  1939       FAMILY HISTORY  Family History   Problem Relation Age of Onset   . Heart Disease Mother    . Cancer Mother         "Cancer In Her Eye"   . Depression Mother    . Mental Illness Mother    . Diabetes Father    . Heart Disease Father         "Heart Attack"   . Hearing Loss Father    . Cancer Sister         "Breast Cancer"   . Early Death Brother         "At [employer]   . Diabetes Sister    . Vision Loss Son         "  Eye Problems"   . Other Son         "Alot of health problems due to motorcycle wreck"   . Arthritis Daughter    . Early Death Son         "Early 34's"       SOCIAL HISTORY  Social History     Social History   . Marital status: Widowed     Spouse name: N/A   . Number of children: N/A   . Years of education: N/A     Social History Main Topics   . Smoking status: Never Smoker   . Smokeless tobacco: Never Used   . Alcohol use No   . Drug use: No   . Sexual activity: No     Other Topics Concern   .  Not on file     Social History Narrative    Do you donate blood or plasma? No    Caffeine intake? Moderate    Advance directive? No    Is blood transfusion acceptable in an emergency? Yes    Live alone or with others? With others    Sunscreen used routinely? No able to care for self? Yes               REVIEW OF SYSTEMS   Review of Systems - History obtained from chart review    PHYSICAL EXAM  GEN: Alert. NAD. Resting in bed.   MS: LLE- sensation intact; CR <2 seconds; NV intact; able to wiggle all toes and PF/DF ankle has active hip and knee ROM with no pain; healed surgical scar left hip; no redness/warmth/erythema to hip region; no reactivity with passive IR/ER hip; calf soft and nontender; all compartments soft.     VITAL SIGNS: BP (!) 171/74   Pulse 63   Temp 98.2 F (36.8 C) (Oral)   Resp 14   Ht 4' 9.24" (1.454 m)   Wt 95 lb (43.1 kg)   SpO2 97%   BMI 20.38 kg/m     LABS   Recent Labs      09/15/17   1845  09/16/17   0228  09/16/17   0627  09/17/17   0540  09/17/17   0857   WBC  7.9  7.0  7.5  5.6  6.6   HCT  30.1*  23.1*  26.3*  22.9*  24.8*   PLT  393  279  333  277  280   NA  139  137  140  138   --    K  4.0  3.8  4.2  4.0   --    CL  94*  99  101  101   --    CO2  25  26  24  24    --    BUN  80*  70*  69*  63*   --    CREATININE  4.3*  3.9*  3.6*  3.6*   --    CALCIUM  7.9*  7.6*  7.8*  7.5*   --    MG   --   2.5*  2.8*  2.6*   --    AST  16   --    --    --    --    ALT  7*   --    --    --    --    LIPASE  4*   --    --    --    --  IMAGING  CT abdomen: incidental finding-   There appears to be a 4.7 x 4.2 cm fluid collection   ventral to the left hip with rim calcifications         ASSESSMENT     Patient Active Problem List   Diagnosis   . Type II or unspecified type diabetes mellitus without mention of complication, not stated as uncontrolled   . Shortness of breath on exertion   . Anemia of chronic disease   . Kidney problem   . CAD (coronary artery disease)   . Arthritis   . Chest  pain   . Essential hypertension   . PAF (paroxysmal atrial fibrillation) ([place])   . Stage 4 chronic kidney disease ([place])   . ARF (acute renal failure) ([place])   . AKI (acute kidney injury) ([place])        83 y.o. female with fluid collection ventral to left hip with history of B/L total hip arthroplasties.   PLAN   1. Recommend aspiration per Interventional Radiology to rule out infection.   2. No acute orthopedic recommendation at this time.   3. WBAT left lower extremity.   4. Will follow.   5. Plan discussed with Dr. Bronson Curb.      Electronically signed by: Tresa Garter, PA-C, 09/17/2017 11:41 AM

## 2017-09-18 LAB — EKG 12-LEAD
Atrial Rate: 64 {beats}/min
P Axis: -27 degrees
P-R Interval: 210 ms
Q-T Interval: 460 ms
QRS Duration: 100 ms
QTc Calculation (Bazett): 474 ms
R Axis: -35 degrees
T Axis: 71 degrees
Ventricular Rate: 64 {beats}/min

## 2017-09-18 LAB — BASIC METABOLIC PANEL
Anion Gap: 12 (ref 4–16)
BUN: 61 MG/DL — ABNORMAL HIGH (ref 6–23)
CO2: 21 MMOL/L (ref 21–32)
Calcium: 7.4 MG/DL — ABNORMAL LOW (ref 8.3–10.6)
Chloride: 104 mMol/L (ref 99–110)
Creatinine: 3.2 MG/DL — ABNORMAL HIGH (ref 0.6–1.1)
GFR African American: 17 mL/min/{1.73_m2} — ABNORMAL LOW (ref >60)
GFR Non-African American: 14 mL/min/{1.73_m2} — ABNORMAL LOW (ref >60)
Glucose: 94 MG/DL (ref 70–99)
Potassium: 3.9 MMOL/L (ref 3.5–5.1)
Sodium: 137 MMOL/L (ref 135–145)

## 2017-09-18 LAB — CBC WITH AUTO DIFFERENTIAL
Basophils %: 0.5 % (ref 0–1)
Basophils Absolute: 0 10*3/uL
Eosinophils %: 1 % (ref 0–3)
Eosinophils Absolute: 0.1 10*3/uL
Hematocrit: 26 % — ABNORMAL LOW (ref 37–47)
Hemoglobin: 7.9 GM/DL — ABNORMAL LOW (ref 12.5–16.0)
Immature Neutrophil %: 0.3 % (ref 0–0.43)
Lymphocytes %: 14.3 % — ABNORMAL LOW (ref 24–44)
Lymphocytes Absolute: 0.9 10*3/uL
MCH: 34.8 PG — ABNORMAL HIGH (ref 27–31)
MCHC: 30.4 % — ABNORMAL LOW (ref 32.0–36.0)
MCV: 114.5 FL — ABNORMAL HIGH (ref 78–100)
MPV: 9.6 FL (ref 7.5–11.1)
Monocytes %: 10.3 % — ABNORMAL HIGH (ref 0–4)
Monocytes Absolute: 0.6 10*3/uL
Nucleated RBC %: 0 %
Platelets: 290 10*3/uL (ref 140–440)
RBC: 2.27 10*6/uL — ABNORMAL LOW (ref 4.2–5.4)
RDW: 16.4 % — ABNORMAL HIGH (ref 11.7–14.9)
Segs Absolute: 4.4 10*3/uL
Segs Relative: 73.6 % — ABNORMAL HIGH (ref 36–66)
Total Immature Neutrophil: 0.02 10*3/uL
Total Nucleated RBC: 0 10*3/uL
WBC: 6 10*3/uL (ref 4.0–10.5)

## 2017-09-18 LAB — CELL COUNT WITH DIFFERENTIAL, BODY FLUID
Lymphocytes, Body Fluid: 48 %
Mesothelial, Fluid: 20 /100 WBC
Monocyte Count, Fluid: 32 %
Neutrophil Count, Fluid: 0 %
RBC, Fluid: 6000 /mm3
WBC, Fluid: 754 /mm3

## 2017-09-18 LAB — UREA NITROGEN, URINE: Urea Nitrogen, Ur: 441 MG/DL

## 2017-09-18 LAB — CANCER ANTIGEN 19-9: CA 19-9: 95 — ABNORMAL HIGH

## 2017-09-18 LAB — MAGNESIUM: Magnesium: 3.1 mg/dl — ABNORMAL HIGH (ref 1.8–2.4)

## 2017-09-18 LAB — BLOOD OCCULT STOOL DIAGNOSTIC: Occult Blood Fecal: POSITIVE — AB

## 2017-09-18 MED ORDER — ACETAMINOPHEN 650 MG RE SUPP
650 MG | RECTAL | Status: DC | PRN
Start: 2017-09-18 — End: 2017-09-26

## 2017-09-18 MED ORDER — ISOSORBIDE MONONITRATE ER 30 MG PO TB24
30 MG | Freq: Every day | ORAL | Status: DC
Start: 2017-09-18 — End: 2017-09-19
  Administered 2017-09-19 (×2): 30 mg via ORAL

## 2017-09-18 MED ORDER — PROPOFOL 200 MG/20ML IV EMUL
200 | INTRAVENOUS | Status: AC
Start: 2017-09-18 — End: 2017-09-18

## 2017-09-18 MED ORDER — SODIUM CHLORIDE 0.9 % IV SOLN
0.9 % | INTRAVENOUS | Status: DC | PRN
Start: 2017-09-18 — End: 2017-09-18
  Administered 2017-09-18: 17:00:00 via INTRAVENOUS

## 2017-09-18 MED ORDER — HYDRALAZINE HCL 25 MG PO TABS
25 MG | Freq: Four times a day (QID) | ORAL | Status: DC | PRN
Start: 2017-09-18 — End: 2017-09-18

## 2017-09-18 MED ORDER — LIDOCAINE HCL (CARDIAC) 20 MG/ML IV SOLN
20 | INTRAVENOUS | Status: AC
Start: 2017-09-18 — End: 2017-09-18

## 2017-09-18 MED ORDER — PROPOFOL 200 MG/20ML IV EMUL
200 MG/20ML | INTRAVENOUS | Status: DC | PRN
Start: 2017-09-18 — End: 2017-09-18
  Administered 2017-09-18: 17:00:00 70 via INTRAVENOUS
  Administered 2017-09-18: 17:00:00 40 via INTRAVENOUS

## 2017-09-18 MED ORDER — LIDOCAINE HCL (PF) 2 % IJ SOLN
2 % | INTRAMUSCULAR | Status: DC | PRN
Start: 2017-09-18 — End: 2017-09-18
  Administered 2017-09-18: 17:00:00 80 via INTRAVENOUS

## 2017-09-18 MED FILL — PROPOFOL 200 MG/20ML IV EMUL: 200 MG/20ML | INTRAVENOUS | Qty: 20

## 2017-09-18 MED FILL — ONDANSETRON HCL 4 MG/2ML IJ SOLN: 4 MG/2ML | INTRAMUSCULAR | Qty: 2

## 2017-09-18 MED FILL — FERREX 150 150 MG PO CAPS: 150 mg | ORAL | Qty: 1

## 2017-09-18 MED FILL — SENNA-LAX 8.6 MG PO TABS: 8.6 mg | ORAL | Qty: 2

## 2017-09-18 MED FILL — HYDRALAZINE HCL 50 MG PO TABS: 50 mg | ORAL | Qty: 1

## 2017-09-18 MED FILL — ANASTROZOLE 1 MG PO TABS: 1 mg | ORAL | Qty: 1

## 2017-09-18 MED FILL — PANTOPRAZOLE SODIUM 40 MG PO TBEC: 40 mg | ORAL | Qty: 1

## 2017-09-18 MED FILL — DOK 100 MG PO CAPS: 100 mg | ORAL | Qty: 1

## 2017-09-18 MED FILL — CARVEDILOL 3.125 MG PO TABS: 3.125 mg | ORAL | Qty: 1

## 2017-09-18 MED FILL — SODIUM CHLORIDE 0.9 % IV SOLN: 0.9 % | INTRAVENOUS | Qty: 1000

## 2017-09-18 MED FILL — HEPARIN SODIUM (PORCINE) 5000 UNIT/ML IJ SOLN: 5000 [IU]/mL | INTRAMUSCULAR | Qty: 1

## 2017-09-18 MED FILL — FLUZONE QUADRIVALENT 0.5 ML IM SUSY: 0.5 mL | INTRAMUSCULAR | Qty: 0.5

## 2017-09-18 MED FILL — LIDOCAINE VISCOUS 2 % MT SOLN: 2 % | OROMUCOSAL | Qty: 30

## 2017-09-18 MED FILL — LIDOCAINE HCL (CARDIAC) 20 MG/ML IV SOLN: 20 mg/mL | INTRAVENOUS | Qty: 5

## 2017-09-18 NOTE — Progress Notes (Addendum)
Hospitalist Progress Note      Name:  Judy Velasquez DOB/Age/Sex: December 06, 1928  (83 y.o. female)   MRN & CSN:  [id number] & [id number] Admission Date/Time: 09/16/2017 12:35 AM   Location:  4116/4116-A PCP: [name], MD         Hospital Day: 3    Assessment and Plan:   Judy Velasquez is a 83 y.o.  female      Plan for 12/28 - pt wanted to be discharged today, however, her cr is still 3.2, with baseline of 2.  She has LA grad C erosive esophagitis and was having nausea.  Hopefully she can stay well hydrated at dc given her CKD IV.  Anticipate dc to [facility] or [facility] likely for a temporary stay.  Anticipate dc soon, coordinate care with nephrology.      AKI on CKD IV  -occurred in the setting of volume depletion/emesis  - appreciate nephrology consult, IVF to be stopped later today, monitor BMP     Erosive esophagitis - LA grade C  -PPI, odynophagia is better    Underweight Body mass index is 20.62 kg/m.    Left hip fluid collection -incidental finding on imaging - aspirated in IR 12/26  -12/27 - reviewed synovial fluid studies, WBC not suggestive of infection, follow up ortho attending recommendation     RUE swelling  -[place] negative     Pancreatic atrophy with multiple intraparenchymal cystic masse on CT.Differential diagnosis includes, but is not limited, to chronic pancreatitis with pseudocyst formation and IPMN (intraductal papillary mucinous neoplasm).  -CA 19-9 is high, GI recommended referral to [place] for EUS        HTN  -12/27 - BP high today - restarted home med Imdur  -noted she is actually on hydralazine 100 mg TID at home rather than the listed 50 mg TID, so will adjust the dose for 12/28    Significant constipation  -12/27 - resolved with laxatives, having diarrhea now    Anemia, macrocytic - profile reviewed - consider outpatient hematology eva    Reported left face/neck shingles at end of nov  - looks healed up     Hx of DCIS right breast s/p lumpectomy in 2012  -continue anstrazole         Patient is unable to recall home med list. [name] dtr is not available to clarify. Will need to clarify home med when grand dtr returns  -reviewed meds dispensed through link from [facility] dispense over 1 month ago  -Nifedipine dispensed recently  -Lasix dispensed recently  -2 different eye drops dispensed recnetly  -continue med rec process - asked pharmacy to complete the process 12/28    History of Present Illness:     Having nausea and diarrhea    Advance directives: does not have them per nursing admission summary    Pain level: 0 to 8 while here, Tylenol ordered      Objective:       Intake/Output Summary (Last 24 hours) at 09/18/17 1000  Last data filed at 09/18/17 0643   Gross per 24 hour   Intake              480 ml   Output              200 ml   Net              280 ml      Vitals:   Vitals:  09/18/17 0630   BP: (!) 189/74   Pulse:    Resp:    Temp:    SpO2:      Physical Exam:      Physical Exam   Constitutional: She is oriented to person, place, and time. She appears well-developed.   Pulmonary/Chest: Effort normal.   Neurological: She is alert and oriented to person, place, and time.         Medications:   Medications:   . influenza virus vaccine  0.5 mL Intramuscular Once   . sodium chloride flush  10 mL Intravenous 2 times per day   . bisacodyl  10 mg Rectal Once   . heparin (porcine)  5,000 Units Subcutaneous Q8H   . docusate sodium  100 mg Oral BID   . senna  2 tablet Oral BID   . polyethylene glycol  17 g Oral Daily   . pantoprazole  40 mg Oral BID AC   . anastrozole  1 mg Oral Daily   . carvedilol  3.125 mg Oral BID   . hydrALAZINE  50 mg Oral TID   . iron polysaccharides  150 mg Oral BID      Infusions:   . sodium chloride 40 mL/hr at 09/18/17 0643     PRN Meds:     acetaminophen 650 mg Q4H PRN   sodium chloride flush 10 mL PRN   phenol 1 spray Q2H PRN   magic (miracle) mouthwash 5 mL 4x Daily PRN   ondansetron 4 mg Q6H PRN   prochlorperazine 10 mg Q6H PRN   acetaminophen 650 mg Q4H PRN          Electronically signed by Regino Bellow, MD on 09/18/2017 at 10:00 AM

## 2017-09-18 NOTE — Care Coordination-Inpatient (Signed)
CM reviewed chart and met with patient in room to initiate dc planning.  PTA, patient living in two story home with her granddaughter and 81 year old great grandson in [place].  Patient states everything she needs is on the ground level and she does not go up/down the steps.  Patient has a walker, shower chair and tilt chair. Granddaughter helps with transportation.  Patient has PCP, Dr. Sonnie Alamo and [facility].  CM notes PT/OT rec of SNF placement.  Patient agreeable and would like to look at [facility] or [facility].  Patient asked that I wait to contact her granddaughter until all arrangements are made.  CM to follow.    1519  CM called referral to [facility] [phone]; left message with referral info.  Will await call back.

## 2017-09-18 NOTE — Progress Notes (Signed)
Occupational Therapy   Occupational Therapy Initial Assessment  Date: 09/18/2017   Patient Name: Judy Velasquez  MRN: [id number]     DOB: [birth year]    Date of Service: 09/18/2017    Discharge Recommendations:  [facility], Patient would benefit from continued therapy after discharge ([facility])         Patient Diagnosis(es): There were no encounter diagnoses.     has a past medical history of Allergic rhinitis; Anemia; Anesthesia; Anxiety; Arthritis; Back problem; Blood transfusion; CAD (coronary artery disease); DCIS (ductal carcinoma in situ); Glaucoma; H/O cardiovascular stress test; Heart attack ([place]); HOH (hard of hearing); Hyperlipidemia; Hypertension; IBS (irritable bowel syndrome); Kidney problem; Nausea & vomiting; Osteoarthritis; Osteoporosis; Right Breast Cancer; Shortness of breath on exertion; Staph infection; TB (tuberculosis); and Type II or unspecified type diabetes mellitus without mention of complication, not stated as uncontrolled.   has a past surgical history that includes Cesarean section ("Late 1950's or Early 1960's"); Carpal tunnel release (2000's); Breast surgery (7-12); joint replacement (1980's); joint replacement (2000's); joint replacement (1990's); joint replacement (1980's); other surgical history; Hysterectomy, total abdominal (1960's); Cardiac surgery (2008); other surgical history (2000's); Colonoscopy (2000's); Endoscopy, colon, diagnostic (1990's); Tonsillectomy and adenoidectomy (1939); Cholecystectomy, laparoscopic (1990's); Breast lumpectomy (05/20/2011); Coronary artery bypass graft (2008); and Upper gastrointestinal endoscopy (N/A, 09/18/2017).           Restrictions  Restrictions/Precautions  Restrictions/Precautions: General Precautions, Fall Risk, Up as Tolerated  Implants present? :  (bil THR, R TKR)  Position Activity Restriction  Other position/activity restrictions: IV; foley, pulse ox. RN, Trish, cleared pt for OT and was w/pt on OTs arrival,  managing her hygiene s/p BM and assisting from [facility] back to chair.     Subjective   General  Chart Reviewed: Yes  Patient assessed for rehabilitation services?: Yes  Family / Caregiver Present: Yes  Subjective  Subjective: Pt agreable to therapy evaluation. Pleasant, cooperative.   Pain Assessment  Patient Currently in Pain: Denies (later indicates abd pain but does not rate)  Pain Assessment: 0-10  Pain Level: 0     Social/Functional History  Social/Functional History  Lives With:  (granddtr and her 38 y.o. son; son lives next door)  Type of Home: House  Home Layout: Two level, Able to Live on Main level with bedroom/bathroom  Home Access: Stairs to enter with rails  Entrance Stairs - Number of Steps: 3  Entrance Stairs - Rails: Both  Bathroom Shower/Tub: Civil engineer, contracting with back  Biochemist, clinical: Soil scientist: Engineer, site: 4 wheeled walker, Radio producer, Teacher, English as a foreign language, IT sales professional, Theatre stage manager Help From: Family  ADL Assistance:  (granddtr provides sup for bathing d/t prior fall out of tub)  Homemaking Assistance:  (granddtr is primary for laundry, pt folds laundry; granddtr does all addtl cleaning; pt does some meal prep - mainly uses microwave)  Homemaking Responsibilities: No  Ambulation Assistance:  (4WW at all times)  Transfer Assistance: Needs assistance (needs some A w/tub transfer; has A from granddtr getting in/out of bed several times a night d/t frequent urination)  Active Driver: No  Occupation: Retired  Additional Comments: pt independent prior to recent cataract sx. now requires increased assist for bathing, homemaking, transportation. Still mod I for gait, transfers, dressing. [name] is w/pt all the time but pt states she is worried about her because she is not getting sleep with assisting her and taking care of her 40 y.o. son. Pt's son is unable to A - disabled  per pt.       Objective   Vision: Impaired  Vision Exceptions: Legally blind;Cataracts (recent cataract sx  affected pt's vision negatively)  Hearing: Exceptions to [facility]  Hearing Exceptions: Hard of hearing/hearing concerns    Orientation  Overall Orientation Status: Within Functional Limits  Observation/Palpation  Posture: Good  Observation: arthritic changes bil hands leading to imp ability to make a full fist  Edema: mod edema RUE, min edema LUE, primarily from elbow into forearm and then in R hand>L hand  Balance  Sitting Balance:  (n/t, as pt in supported sitting in chair)  Standing Balance: Contact guard assistance (w/UE support on RW)  Standing Balance  Time: x 3 min  Activity: peri/anal hygiene, barrier cream applied, pad under bottom changed  Sit to stand: Contact guard assistance (to min A from chair)  Stand to sit: Minimal assistance (to chair w/cues for hand placement)  Comment: pt completing hygiene w/max A     Tone RUE  RUE Tone: Normotonic  Tone LUE  LUE Tone: Normotonic  Quality of Movement Other  Comment: arthritic changes bil hands leading to imp ability to make a full fist. Pt reports difficulty managing silverware effectively when asked - has not trialed built up utensils.     Bed mobility  Comment: n/t, as pt seated in chair upon OT's arrival  Transfers  Stand Pivot Transfers: Contact guard assistance (to min A w/RW to chair)  Sit to stand: Contact guard assistance (to min A from chair)  Stand to sit: Minimal assistance (to chair w/cues for hand placement)  Transfer Comments: amb in room x 81f at CGA to min A w/RW. Pt fairly steady, no LOB, but presents w/decreased act tolerance (has also had a busy day w/EGD as well as PT eval earlier).     Cognition  Overall Cognitive Status: WFL        Sensation  Overall Sensation Status: WFL (bil UEs/LEs)        LUE AROM (degrees)  LUE AROM :  (grossly WFL at shd, elbow, 60-70* full fist d/t arthritis and edema)  RUE AROM (degrees)  RUE AROM :  (to approx 80* shd flexion, elbow flex limited by PIV and edema, full elbow ext, partial fist at approx 40% d/t arthritis  and edema)  LUE Strength  Gross LUE Strength:  (3/5 shd, 3+/5 elbow flexion, 4-/5 elbow ext, grasp limited d/t imp AROM)  RUE Strength  Gross RUE Strength:  (unable to tolerate resistance to R shd or w/elbow flexion, 4-/5 elbow ext, grasp limited d/t imp AROM)     Hand Dominance  Hand Dominance: Right            Assessment   Performance deficits / Impairments: Decreased functional mobility ;Decreased ADL status;Decreased endurance;Decreased balance;Decreased strength;Decreased ROM;Decreased high-level IADLs  Prognosis: Good  Decision Making: Medium Complexity  History: Pt is an 83y.o. F adm to hosp w/dx of ARF/AKI on CKD, RUE swelling, fluid accumulation L hip, nausea and emesis, constipation, anorexia.  RUE duplex neg for brachial artery aneurysm and DVT.  Exam: 7  Assistance / Modification: Pt presents w/deficits in the above areas. Based on reported PLOF, current status, and anticipated d/c needs, pt will benefit from acute OT services as well as cont'd OT services at d/c on SNF. OT services are necessary to restore pt's functional ADL independence, to restore pt's mobility independence, to address safety and A/E/DME needs, and to improve act tolerance and strength.   Patient Education: role of  therapy, sit<>stand, SPT, ambulation, edu in safe mobility tech, sp bathing, LB dressing, discharge recommendations/planning  Cues were given for safety, sequence, UE/LE placement, awareness, and balance.    Barriers to Learning: visual imp  REQUIRES OT FOLLOW UP: Yes  Activity Tolerance  Activity Tolerance: Patient Tolerated treatment well;Patient limited by fatigue  Safety Devices  Safety Devices in place: Yes  Type of devices: Nurse notified;Gait belt;Patient at risk for falls;Call light within reach;Left in chair;Chair alarm in place         Plan   Plan  Times per week: 1x+  Times per day: Daily  Current Treatment Recommendations: Strengthening, ROM, Balance Training, Therapist, nutritional, Engineer, production,  Proofreader, Brewing technologist, Child psychotherapist, Barrister's clerk, Education, Visual merchandiser, Self-Care / ADL    G-Code  OT G-codes  Functional Assessment Tool Used: modified AM PAC 6  AM-PAC 6 click short form for inpatient daily activity:   How much help from another person does the patient currently need... Unable  Dep A Lot  Max A A Lot   Mod A A Little  Min A A Little   CGA  SBA None   Mod I  Indep  Sup   1.  Putting on and taking off regular lower body clothing? '[]'  1    '[]'  2   '[x]'  2   '[]'  3   '[]'  3   '[]'  4      2. Bathing (including washing, rinsing, drying)? '[]'  1   '[]'  2   '[x]'  2 '[]'  3 '[]'  3 '[]'  4   3. Toileting, which includes using toilet, bedpan, or urinal? '[]'  1    '[x]'  2   '[]'  2   '[]'  3   '[]'  3   '[]'  4     4. Putting on and taking off regular upper body clothing? '[]'  1   '[]'  2   '[]'  2   '[x]'  3   '[]'  3    '[]'  4      5. Taking care of personal grooming such as brushing teeth? '[]'  1   '[]'  2    '[]'  2 '[]'  3    '[x]'  3   '[]'  4      6. Eating meals?   '[]'  1   '[]'  2   '[]'  2   '[]'  3   '[x]'  3   '[]'  4      Raw Score:  15     [24=0% impaired(CH), 23=1-19%(CI), 20-22=20-39%(CJ), 15-19=40-59%(CK), 10-14=60-79%(CL), 7-9=80-99%(CM), 6=100%(CN)]    Functional Limitation: Self care  Self Care Current Status (D2202): At least 40 percent but less than 60 percent impaired, limited or restricted  Self Care Goal Status (R4270): At least 20 percent but less than 40 percent impaired, limited or restricted  OutComes Score                                                  AM-PAC Score             Goals  Short term goals  Time Frame for Short term goals: until pt discharged from hosp or goals met  Short term goal 1: Pt will complete bed mobility at [facility].  Short term goal 2: Pt will complete transfers and amb w/RW at SBA/sup in room for ADL completion.  Short term goal  3: Pt will complete sp bath at [facility] w/use of A/E prn.  Short term goal 4: Pt will complete bil UE AROM/light strengthening at sup w/min vcues to  increase act tolerance and strength.   Short term goal 5: Pt will complete toileting at [place].       Therapy Time   Individual Concurrent Group Co-treatment   Time In 1435         Time Out 1515         Minutes 40         Timed Code Treatment Minutes: 25 Minutes       Antwine Agosto L Claudina Oliphant, OT/L

## 2017-09-18 NOTE — Anesthesia Post-Procedure Evaluation (Signed)
Department of Anesthesiology  Postprocedure Note    Patient: Judy Velasquez  MRN: [id number]  Birthdate: 1929-08-31  Date of evaluation: 09/18/2017  Time:  11:57 AM     Procedure Summary     Date:  09/18/17 Room / Location:  [facility] / [facility]    Anesthesia Start:  7948 Anesthesia Stop:      Procedure:  EGD BIOPSY (N/A ) Diagnosis:  (-)    Surgeon:  [name], MD Responsible Provider:  Lucila Maine, DO    Anesthesia Type:  MAC ASA Status:  3          Anesthesia Type: MAC    Aldrete Phase I:      Aldrete Phase II:      Last vitals: Reviewed and per EMR flowsheets.       Anesthesia Post Evaluation    Patient location during evaluation: bedside  Patient participation: complete - patient participated  Level of consciousness: awake  Pain score: 0  Airway patency: patent  Nausea & Vomiting: no nausea and no vomiting  Complications: no  Cardiovascular status: hemodynamically stable  Respiratory status: acceptable, room air, spontaneous ventilation and nonlabored ventilation  Hydration status: stable

## 2017-09-18 NOTE — Plan of Care (Signed)
Problem: Falls - Risk of:  Goal: Will remain free from falls  Will remain free from falls   Outcome: Ongoing    Goal: Absence of physical injury  Absence of physical injury   Outcome: Ongoing      Problem: Pain:  Goal: Pain level will decrease  Pain level will decrease   Outcome: Ongoing    Goal: Control of acute pain  Control of acute pain   Outcome: Ongoing    Goal: Control of chronic pain  Control of chronic pain   Outcome: Ongoing      Problem: Risk for Impaired Skin Integrity  Goal: Tissue integrity - skin and mucous membranes  Structural intactness and normal physiological function of skin and  mucous membranes.   Outcome: Ongoing      Problem: Musculor/Skeletal Functional Status  Goal: Highest potential functional level  Outcome: Ongoing

## 2017-09-18 NOTE — Plan of Care (Signed)
Problem: Falls - Risk of:  Goal: Will remain free from falls  Will remain free from falls   Outcome: Ongoing    Goal: Absence of physical injury  Absence of physical injury   Outcome: Ongoing      Problem: Pain:  Goal: Pain level will decrease  Pain level will decrease   Outcome: Ongoing    Goal: Control of acute pain  Control of acute pain   Outcome: Ongoing    Goal: Control of chronic pain  Control of chronic pain   Outcome: Ongoing      Problem: Risk for Impaired Skin Integrity  Goal: Tissue integrity - skin and mucous membranes  Structural intactness and normal physiological function of skin and  mucous membranes.   Outcome: Ongoing

## 2017-09-18 NOTE — Brief Op Note (Signed)
BRIEF EGD REPORT:    Pre-op Diagnosis: odynophagia  Post-op Diagnosis: see impression below    Anesthesia: MAC  Estimated blood loss: less than 50 cc  Implants: none  Drains: none  Complications: none  Specimens- none- or as referred to below    Impression:    1) erosive/exudative esophagitis- LA Class C- biopsied and brushed for fungal smear    2) 1 cm clean-based ulcer distal duodenal bulb   3) unusual black peppery appearance to duodenum- biopsied   4)antral and fundic biopsies taken to assay for H pylori by the Clo-Test method          Suggest:   1) await biopsies   2) continue BID Protonix        The complete operative report (including photos) is available in the following locations:   1) soft chart now   2) report will also be scanned and can then be found by going to "chart review" then "notes" then "op report" - "scanning HPF" or by going to "chart review" then "media" then "op report". For review of photos, may need to go to page 2.

## 2017-09-18 NOTE — Other (Signed)
positive ob reported to dr. Karyl Kinnier

## 2017-09-18 NOTE — Progress Notes (Signed)
Physical Therapy    Facility/Department: [facility]  Initial Assessment    NAME: Judy Velasquez  DOB: August 09, 1929  MRN: [id number]    Date of Service: 09/18/2017    Discharge Recommendations:  [place] (pt wants home with [facility] )   PT Equipment Recommendations  Equipment Needed: No    Patient Diagnosis(es): There were no encounter diagnoses.     has a past medical history of Allergic rhinitis; Anemia; Anesthesia; Anxiety; Arthritis; Back problem; Blood transfusion; CAD (coronary artery disease); DCIS (ductal carcinoma in situ); Glaucoma; H/O cardiovascular stress test; Heart attack ([place]); HOH (hard of hearing); Hyperlipidemia; Hypertension; IBS (irritable bowel syndrome); Kidney problem; Nausea & vomiting; Osteoarthritis; Osteoporosis; Right Breast Cancer; Shortness of breath on exertion; Staph infection; TB (tuberculosis); and Type II or unspecified type diabetes mellitus without mention of complication, not stated as uncontrolled.   has a past surgical history that includes Cesarean section ("Late 1950's or Early 1960's"); Carpal tunnel release (2000's); Breast surgery (7-12); joint replacement (1980's); joint replacement (2000's); joint replacement (1990's); joint replacement (1980's); other surgical history; Hysterectomy, total abdominal (1960's); Cardiac surgery (2008); other surgical history (2000's); Colonoscopy (2000's); Endoscopy, colon, diagnostic (1990's); Tonsillectomy and adenoidectomy (1939); Cholecystectomy, laparoscopic (1990's); Breast lumpectomy (05/20/2011); and Coronary artery bypass graft (2008).    Restrictions  Restrictions/Precautions  Restrictions/Precautions: General Precautions, Fall Risk, Up as Tolerated  Position Activity Restriction  Other position/activity restrictions: IV; foley   Vision/Hearing  Vision: Impaired  Vision Exceptions: Legally blind;Cataracts  Hearing: Exceptions to [facility]     Subjective  General  Chart Reviewed: Yes  Patient assessed for rehabilitation  services?: Yes  Family / Caregiver Present: No  Referring Practitioner: Regino Bellow, MD  Referral Date : 09/17/17  Diagnosis: ARF  Follows Commands: Within Functional Limits  General Comment  Comments: pt in bed upon arrival   Subjective  Subjective: pt agreeable to therapy eval. reports that balance deficits coincided with unsuccessful cataract sx that resulted in severe vision impairment  Pain Screening  Patient Currently in Pain: Denies  Pain Assessment  Pain Assessment: 0-10  Pain Level: 0  Vital Signs  Patient Currently in Pain: Denies       Orientation  Orientation  Overall Orientation Status: Within Normal Limits  Social/Functional History  Social/Functional History  Lives With: Family  Type of Home: House  Home Layout: Two level, Able to Live on Main level with bedroom/bathroom  Home Access: Stairs to enter with rails  Entrance Stairs - Number of Steps: 3  Bathroom Shower/Tub: Administrator, Civil Service: Soil scientist: Engineer, site: Conservation officer, nature, Radio producer (walker in house; cane for community )  Receives Help From: Family  ADL Assistance: Needs assistance  Homemaking Assistance: Needs assistance  Homemaking Responsibilities: No  Ambulation Assistance: Independent  Transfer Assistance: Independent  Active Driver: No  Occupation: Retired  Additional Comments: pt independent prior to recent cataract sx. now requires increased assist for bathing, homemaking, transportation. Still mod I for gait, transfers, dressing    Objective     Observation/Palpation  Posture: Good  Observation: generalized atrophy;     PROM RLE (degrees)  RLE PROM: WNL  AROM RLE (degrees)  RLE AROM: WNL  PROM LLE (degrees)  LLE PROM: WNL  AROM LLE (degrees)  LLE AROM : WNL  Strength RLE  Strength RLE: Exception  Comment: grossly 4/5. observed weakness with mobility   Strength LLE  Strength LLE: Exception  Comment: grossly 4/5. observed weakness with mobility   Motor  Control  Gross Motor?:  WNL  Sensation  Overall Sensation Status: WNL  Bed mobility  Supine to Sit: Independent  Sit to Supine: Independent  Scooting: Independent  Transfers  Sit to Stand: Contact guard assistance  Stand to sit: Contact guard assistance  Stand Pivot Transfers: Contact guard assistance  Comment: rises on first attempt consistently. poor hand placement, limited carryover of cues on subsequent performance. sits prematurely without addressing surface   Ambulation  Ambulation?: Yes  Ambulation 1  Surface: level tile  Device: Rolling Walker  Assistance: Contact guard assistance  Quality of Gait: antalgic, reduced stance time on left (pt denies hip pain), verbal cues for environment awareness d/t visual impairments. overall, gait is slow and hesitant   Distance: 150 ft   Comments: pt c/o weakness, fatigue and requests to turn back      Balance  Posture: Good  Sitting - Static: Good  Sitting - Dynamic: Good  Standing - Static: Fair;-  Standing - Dynamic: Fair;-  Comments: pt needs RW for all dynamic balance. see tinetti        Assessment   Body structures, Functions, Activity limitations: Decreased functional mobility ;Decreased ADL status;Decreased safe awareness;Decreased strength;Decreased endurance;Decreased high-level IADLs;Decreased fine motor control;Decreased balance  Assessment: pt is an 83 yo female who [facility] to [facility] with ARF, emesis, anorexia, weakness. left hip pain and edema also pertinent to this admission. pt evaluated for PT services on this date where she exhibits the aforementioned impairments and demos unsafe gait and transfers. She is generally weak, but dynamic balance and vision deficits are most detrimental to her safety with OOB mobility. The pt had just returned home from skilled stay prior to admit. pt would benefit from continued skilled services. Safest d/c rec is SNF; however pt is insistent on returning home with [place]. If that is her choice at time of d/c, she will need HH PT S3  Specific  instructions for Next Treatment: progress dynamic balance   Prognosis: Good  Decision Making: Medium Complexity  Patient Education: safety, gait, POC, dc   REQUIRES PT FOLLOW UP: Yes  Activity Tolerance  Activity Tolerance: Patient Tolerated treatment well         Plan   Plan  Times per week: 3+  Times per day: Daily  Specific instructions for Next Treatment: progress dynamic balance   Current Treatment Recommendations: Strengthening, Hotel manager, Therapist, nutritional, Training and development officer, Engineer, production, IADL Training, Personnel officer, Psychologist, clinical, Brewing technologist, Proofreader, Home Exercise Program, Child psychotherapist, Barrister's clerk, Education, & procurement  Safety Devices  Type of devices: Call light within reach, Left in chair, Patient at risk for falls, All fall risk precautions in place, Chair alarm in place    G-Code  PT G-Codes  Functional Assessment Tool Used: ampac   Functional Limitation: Mobility: Walking and moving around  Mobility: Walking and Moving Around Current Status [phone]): At least 20 percent but less than 40 percent impaired, limited or restricted  Mobility: Walking and Moving Around Goal Status [phone]): At least 20 percent but less than 40 percent impaired, limited or restricted  OutComes Score  Balance Score: 6  Gait Score: 9        Tinetti Total Score: 15     AM-PAC Score  AM-PAC Inpatient Mobility Raw Score : 20  AM-PAC Inpatient T-Scale Score : 47.67  Mobility Inpatient CMS 0-100% Score: 35.83  Mobility Inpatient CMS G-Code Modifier : CJ     Goals  Short term  goals  Time Frame for Short term goals: one week  Short term goal 1: pt will increase tinetti score to 18  Short term goal 2: pt will transfer consistently at [place] with good hand placement   Short term goal 3: pt will ambulate 250 ft, safely navigating obstacles with LRAD     Therapy Time      Time In: 0900  Time Out: 940   Total Treatment Time: 40   Timed  Code Treatment Minutes: [place], PT

## 2017-09-18 NOTE — Progress Notes (Signed)
Nephrology Progress Note        [address] [place] [address]  Phone: [phone]  Office Hours: 8:30AM - 4:30PM  Monday - Friday       [facility]  Claretta Fraise, MD  [address] DO  [place],  [place] [address]  PHONE: [phone]  FAX: [phone]    09/18/2017 11:11 AM  Subjective:   Admit Date: 09/16/2017  PCP: [name], MD  Interval History: getting EGD today  Nausea is better    Diet: Diet NPO, After Midnight      Data:   Scheduled Meds:  . influenza virus vaccine  0.5 mL Intramuscular Once   . sodium chloride flush  10 mL Intravenous 2 times per day   . bisacodyl  10 mg Rectal Once   . heparin (porcine)  5,000 Units Subcutaneous Q8H   . docusate sodium  100 mg Oral BID   . senna  2 tablet Oral BID   . polyethylene glycol  17 g Oral Daily   . pantoprazole  40 mg Oral BID AC   . anastrozole  1 mg Oral Daily   . carvedilol  3.125 mg Oral BID   . hydrALAZINE  50 mg Oral TID   . iron polysaccharides  150 mg Oral BID     Continuous Infusions:  . sodium chloride 40 mL/hr at 09/18/17 0643     PRN Meds:acetaminophen, sodium chloride flush, phenol, magic (miracle) mouthwash, ondansetron, prochlorperazine, acetaminophen  I/O last 3 completed shifts:  In: 680 [P.O.:200; I.V.:480]  Out: 200 [Drains:200]  No intake/output data recorded.    Intake/Output Summary (Last 24 hours) at 09/18/17 1111  Last data filed at 09/18/17 7035   Gross per 24 hour   Intake              480 ml   Output              200 ml   Net              280 ml       CBC:   Recent Labs      09/17/17   0540  09/17/17   0857  09/18/17   0529   WBC  5.6  6.6  6.0   HGB  7.2*  8.0*  7.9*   PLT  277  280  290       BMP:  Recent Labs      09/16/17   0627  09/17/17   0540  09/18/17   0529   NA  140  138  137   K  4.2  4.0  3.9   CL  101  101  104   CO2  24  24  21    BUN  69*  63*  61*   CREATININE  3.6*  3.6*  3.2*   GLUCOSE  89  82  94     Hepatic: Recent Labs       09/15/17   1845   AST  16   ALT  7*   BILITOT  0.3   ALKPHOS  95     Troponin: No results for input(s): TROPONINI in the last 72 hours.  BNP: No results for input(s): BNP in the last 72 hours.  Lipids: No results for input(s): CHOL, HDL in the last 72 hours.    Invalid input(s): LDLCALCU  ABGs:  No results found for: PHART, PO2ART, PCO2ART  INR:   Recent Labs      09/17/17   0857   INR  1.02       Objective:   Vitals: BP (!) 189/74   Pulse 69   Temp 98.3 F (36.8 C) (Oral)   Resp 18   Ht 4' 9.24" (1.454 m)   Wt 96 lb 1.6 oz (43.6 kg)   SpO2 99%   BMI 20.62 kg/m   General appearance: alert and cooperative with exam, in no acute distress  HEENT: normocephalic, atraumatic,   Neck: supple, trachea midline  Lungs:breathing comfortably on room air  Heart:: regular rate and rhythm, S1, S2 normal,  Abdomen:  non distended,   Extremities: extremities atraumatic, no cyanosis or edema  Neurologic: alert, oriented, follows commands, interactive    Assessment and Plan:   - AKI: cr peak of 4.3, from volume depletion in setting of emesis, No obstructive uropathy on ct a/p  - CKD4  - HTN  - Emesis/Esophagitis  - Constipation  - 4.7cm fluid collection ventral to left hip: s/p fluid aspiration  - acute anemia: No signs of iron deficiency per iron profile      Plan:  Cr down to 3.2 today  Continue NS at 40ml/hr until later today then will stop and reassess need for IVF  Please renally dose meds  Will follow                      Electronically signed by Lyda Kalata, DO on 09/18/2017 at [place], MD  Rennis Harding, DO  [place] [address]  PHONE: [phone]  FAX: [phone]

## 2017-09-18 NOTE — Progress Notes (Signed)
Occupational Therapy  OT eval and tx orders received. Pt currently off floor for EGD per RN, Gilmore Laroche. Will reattempt at later time. HBright, OT/L

## 2017-09-18 NOTE — Progress Notes (Signed)
Report called to floor Trish RN. Vitals 116/43 58-13 99% on room air. Pt drowsy but arousable and talking to Dr Edwena Felty. Transported to floor via cart to room 4116 per [employer]. No family or friends here at this time.

## 2017-09-19 LAB — BASIC METABOLIC PANEL
Anion Gap: 14 (ref 4–16)
BUN: 56 MG/DL — ABNORMAL HIGH (ref 6–23)
CO2: 21 MMOL/L (ref 21–32)
Calcium: 7.3 MG/DL — ABNORMAL LOW (ref 8.3–10.6)
Chloride: 101 mMol/L (ref 99–110)
Creatinine: 3 MG/DL — ABNORMAL HIGH (ref 0.6–1.1)
GFR African American: 18 mL/min/{1.73_m2} — ABNORMAL LOW (ref >60)
GFR Non-African American: 15 mL/min/{1.73_m2} — ABNORMAL LOW (ref >60)
Glucose: 85 MG/DL (ref 70–99)
Potassium: 4.1 MMOL/L (ref 3.5–5.1)
Sodium: 136 MMOL/L (ref 135–145)

## 2017-09-19 LAB — CBC WITH AUTO DIFFERENTIAL
Basophils %: 0.4 % (ref 0–1)
Basophils Absolute: 0 10*3/uL
Eosinophils %: 2.3 % (ref 0–3)
Eosinophils Absolute: 0.1 10*3/uL
Hematocrit: 25.7 % — ABNORMAL LOW (ref 37–47)
Hemoglobin: 7.9 GM/DL — ABNORMAL LOW (ref 12.5–16.0)
Immature Neutrophil %: 0.4 % (ref 0–0.43)
Lymphocytes %: 17 % — ABNORMAL LOW (ref 24–44)
Lymphocytes Absolute: 0.9 10*3/uL
MCH: 34.5 PG — ABNORMAL HIGH (ref 27–31)
MCHC: 30.7 % — ABNORMAL LOW (ref 32.0–36.0)
MCV: 112.2 FL — ABNORMAL HIGH (ref 78–100)
MPV: 9.3 FL (ref 7.5–11.1)
Monocytes %: 9.5 % — ABNORMAL HIGH (ref 0–4)
Monocytes Absolute: 0.5 10*3/uL
Nucleated RBC %: 0 %
Platelets: 290 10*3/uL (ref 140–440)
RBC: 2.29 10*6/uL — ABNORMAL LOW (ref 4.2–5.4)
RDW: 16.7 % — ABNORMAL HIGH (ref 11.7–14.9)
Segs Absolute: 3.7 10*3/uL
Segs Relative: 70.4 % — ABNORMAL HIGH (ref 36–66)
Total Immature Neutrophil: 0.02 10*3/uL
Total Nucleated RBC: 0 10*3/uL
WBC: 5.2 10*3/uL (ref 4.0–10.5)

## 2017-09-19 LAB — CLO TEST: Clotest: NEGATIVE

## 2017-09-19 LAB — MAGNESIUM: Magnesium: 3.4 mg/dl — ABNORMAL HIGH (ref 1.8–2.4)

## 2017-09-19 MED ORDER — ISOSORBIDE DINITRATE 20 MG PO TABS
20 MG | Freq: Three times a day (TID) | ORAL | Status: DC
Start: 2017-09-19 — End: 2017-09-26
  Administered 2017-09-19 – 2017-09-26 (×19): 30 mg via ORAL

## 2017-09-19 MED ORDER — HYDRALAZINE HCL 50 MG PO TABS
50 MG | Freq: Three times a day (TID) | ORAL | Status: DC
Start: 2017-09-19 — End: 2017-09-19

## 2017-09-19 MED ORDER — HYDRALAZINE HCL 50 MG PO TABS
50 MG | Freq: Three times a day (TID) | ORAL | Status: DC
Start: 2017-09-19 — End: 2017-09-26
  Administered 2017-09-19 – 2017-09-26 (×22): 100 mg via ORAL

## 2017-09-19 MED FILL — CARVEDILOL 3.125 MG PO TABS: 3.125 mg | ORAL | Qty: 1

## 2017-09-19 MED FILL — HYDRALAZINE HCL 50 MG PO TABS: 50 mg | ORAL | Qty: 1

## 2017-09-19 MED FILL — HEPARIN SODIUM (PORCINE) 5000 UNIT/ML IJ SOLN: 5000 [IU]/mL | INTRAMUSCULAR | Qty: 1

## 2017-09-19 MED FILL — FERREX 150 150 MG PO CAPS: 150 mg | ORAL | Qty: 1

## 2017-09-19 MED FILL — ANASTROZOLE 1 MG PO TABS: 1 mg | ORAL | Qty: 1

## 2017-09-19 MED FILL — PANTOPRAZOLE SODIUM 40 MG PO TBEC: 40 mg | ORAL | Qty: 1

## 2017-09-19 MED FILL — ISOSORBIDE MONONITRATE ER 30 MG PO TB24: 30 mg | ORAL | Qty: 1

## 2017-09-19 MED FILL — HYDRALAZINE HCL 50 MG PO TABS: 50 mg | ORAL | Qty: 2

## 2017-09-19 MED FILL — ISOSORBIDE DINITRATE 10 MG PO TABS: 10 mg | ORAL | Qty: 1

## 2017-09-19 MED FILL — PAIN & FEVER 325 MG PO TABS: 325 mg | ORAL | Qty: 2

## 2017-09-19 NOTE — Plan of Care (Signed)
Problem: Falls - Risk of:  Goal: Will remain free from falls  Will remain free from falls   Outcome: Ongoing    Goal: Absence of physical injury  Absence of physical injury   Outcome: Ongoing      Problem: Pain:  Goal: Pain level will decrease  Pain level will decrease   Outcome: Ongoing    Goal: Control of acute pain  Control of acute pain   Outcome: Ongoing    Goal: Control of chronic pain  Control of chronic pain   Outcome: Ongoing      Problem: Risk for Impaired Skin Integrity  Goal: Tissue integrity - skin and mucous membranes  Structural intactness and normal physiological function of skin and  mucous membranes.   Outcome: Ongoing      Problem: Musculor/Skeletal Functional Status  Goal: Highest potential functional level  Outcome: Ongoing

## 2017-09-19 NOTE — Progress Notes (Signed)
Medication History  [facility]    Patient Name: Judy Velasquez 1929/07/22     Medication history has been completed by: Felix Ahmadi, CPhT    Source(s) of information: Patient, Patients granddaughter and Insurance claims    Primary Care Physician: [name], MD     Pharmacy: [facility]    Allergies as of 09/15/2017 - Review Complete 09/15/2017   Allergen Reaction Noted   . Celebrex [celecoxib] Shortness Of Breath, Swelling, and Anaphylaxis 04/30/2011   . Fish allergy Anaphylaxis 08/22/2015   . Lisinopril Other (See Comments) 05/13/2011   . Neurontin [gabapentin] Other (See Comments) 05/13/2011   . Norvasc [amlodipine besylate] Other (See Comments) 05/13/2011   . Other  05/15/2011   . Shellfish allergy Anaphylaxis 08/22/2015   . Ciprofloxacin Rash 05/13/2011   . Lipitor Other (See Comments) 05/13/2011   . Losartan  04/25/2016        Prior to Admission medications    Medication Sig Start Date End Date Taking? Authorizing Provider   dorzolamide-timolol (COSOPT) 22.3-6.8 MG/ML ophthalmic solution 1 drop 2 times daily   Yes Historical Provider, MD   hydrALAZINE (APRESOLINE) 100 MG tablet Take 100 mg by mouth 3 times daily   Yes Historical Provider, MD   isosorbide dinitrate (ISORDIL) 30 MG tablet Take 30 mg by mouth 3 times daily   Yes Historical Provider, MD   NIFEdipine (ADALAT CC) 90 MG extended release tablet Take 90 mg by mouth daily   Yes Historical Provider, MD   docusate sodium (PHILLIPS STOOL SOFTENER) 100 MG capsule Take 100 mg by mouth Twice a Week   Yes Historical Provider, MD   iron polysaccharides (FERREX 150) 150 MG capsule Take 150 mg by mouth 2 times daily   Yes Historical Provider, MD   B Complex-Biotin-FA (SUPER B-50 B COMPLEX PO) Take by mouth   Yes Historical Provider, MD   fluticasone (FLONASE) 50 MCG/ACT nasal spray 1 spray by Nasal route daily   Yes Historical Provider, MD   anastrozole (ARIMIDEX) 1 MG tablet Take 1 tablet by mouth daily  04/21/14  Yes Maudie Mercury, MD   furosemide (LASIX) 40 MG tablet Take 40 mg by mouth 2 times daily    Yes Historical Provider, MD   carvedilol (COREG) 6.25 MG tablet Take 6.25 mg by mouth 2 times daily    Yes Historical Provider, MD   benazepril (LOTENSIN) 20 MG tablet Take 20 mg by mouth daily    Historical Provider, MD                                                               Medications added or changed (ex. new medication, dosage change, interval change, formulation change):   Isosorbide dinitrate new medication   Nifedipine new medication   Docusate new medication   Coreg dose changed to 6.25 mg bid from 3.125 mg bid   Lasix frequency changed to bid from qd    Medications removed from list (include reason, ex. noncompliance, medication cost, therapy complete etc.):    Diltiazem no longer taking per granddaughter   Ketorolac eye drops no longer using   Naphcon-A eye drops no longer using   Acyclovir therapy complete   Clindamycin therapy complete   Prednisone therapy complete  Medications requiring reconciliation with provider:    Isosorbide dinitrate new medication not ordered   Nifedipine new medication not ordered   Docusate new medication not ordered   Cosopt not ordered and patient uses   Hydralazine ordered as 50 mg tid patient taking 100 mg tid   Lasix not ordered and patient taking   Coreg ordered as 3.125 mg bid patient taking 6.25 mg bid   Benazepril not ordered new medication for patient     Other Comments:   Patient is a poor historian and is not aware of her entire medication list. Patient provided granddaughters # [phone] and stated she helps with medications.   Contacted Granddaughter and reviewed medication list. Granddaughter states patient was released from [facility] last week.   [place] to obtain med list from recent admission and left message [phone])   Benazepril is a new medication (filled 09/15/2017)  unsure if patient was taking at Nursing facility   Updated med list per patients granddaughter and verified with insurance claims    To my knowledge the above medication history is accurate as of 09/19/2017 11:48 AM.   Felix Ahmadi, CPhT   09/19/2017 11:48 AM

## 2017-09-19 NOTE — Progress Notes (Signed)
Physical Therapy    Physical Therapy Treatment Note  Name: Judy Velasquez MRN: [id number] DOB:   Nov 17, 1928   Date:  09/19/2017   Admission Date: 09/16/2017 Room:  4116/4116-A   Restrictions/Precautions:  Restrictions/Precautions  Restrictions/Precautions: General Precautions, Fall Risk, Up as Tolerated  Implants present? :  (bil THR, R TKR)       Subjective:  Patient states:  "Is breakfast coming this morning?"   Pain:   Location, Type, Intensity (0/10 to 10/10):  Denies   Objective:    Observation:    Sitting up in recliner. Vitals stable on room air. Oriented x 3. Agreeable and cooperative with therapy. When breakfast came during session pt requested to terminate so she could eat before it got cold.   Treatment, including education/measures:  Pt performed sit><stand from recliner >< RW CGA for safety with VCs for UE placement for greater control of transfer. Pt ambulated 119ft with RW CGA for safety. Dec pace, step length, and foot clearance at times. Able to maintain good control of walker and stay within BOS having no LOBs. Pt completed step pivot turn to chair CGA w/ VCs for sequencing full pivot turn with AD prior to sitting.   Assessment / Impression:    Patient's tolerance of treatment:  good  Adverse Reaction: fatigue   Significant change in status and impact:  no  Barriers to improvement:  Activity tolerance, balance, strength, fear   Plan for Next Session:    Continue to progress ambulation distance, address stairs, strengthen, activity tolerance, balance, transfers   Time in:  0838  Time out:  0848  Timed treatment minutes:  10  Total treatment time:  10    Previously filed items:  Social/Functional History  Lives With:  (granddtr and her 81 y.o. son; son lives next door)  Type of Home: House  Home Layout: Two level, Able to Live on Main level with bedroom/bathroom  Home Access: Stairs to enter with rails  Entrance Stairs - Number of Steps: 3  Entrance Stairs - Rails: Both  Bathroom Shower/Tub: Oceanographer with back  Biochemist, clinical: Soil scientist: Engineer, site: 4 wheeled walker, Radio producer, Teacher, English as a foreign language, IT sales professional, Theatre stage manager Help From: Family  ADL Assistance:  (granddtr provides sup for bathing d/t prior fall out of tub)  Homemaking Assistance:  (granddtr is primary for laundry, pt folds laundry; granddtr does all addtl cleaning; pt does some meal prep - mainly uses microwave)  Homemaking Responsibilities: No  Ambulation Assistance:  (4WW at all times)  Transfer Assistance: Needs assistance (needs some A w/tub transfer; has A from granddtr getting in/out of bed several times a night d/t frequent urination)  Active Driver: No  Occupation: Retired  Additional Comments: pt independent prior to recent cataract sx. now requires increased assist for bathing, homemaking, transportation. Still mod I for gait, transfers, dressing. [name] is w/pt all the time but pt states she is worried about her because she is not getting sleep with assisting her and taking care of her 81 y.o. son. Pt's son is unable to A - disabled per pt.  Short term goals  Time Frame for Short term goals: one week  Short term goal 1: pt will increase tinetti score to 18  Short term goal 2: pt will transfer consistently at [facility] with good hand placement   Short term goal 3: pt will ambulate 250 ft, safely navigating obstacles with LRAD        Electronically signed  by:    Catalina Lunger, PT  09/19/2017, 8:49 AM

## 2017-09-19 NOTE — Plan of Care (Signed)
Problem: Falls - Risk of:  Goal: Will remain free from falls  Will remain free from falls   Outcome: Ongoing    Goal: Absence of physical injury  Absence of physical injury   Outcome: Ongoing      Problem: Pain:  Goal: Pain level will decrease  Pain level will decrease   Outcome: Met This Shift    Goal: Control of acute pain  Control of acute pain   Outcome: Ongoing    Goal: Control of chronic pain  Control of chronic pain   Outcome: Ongoing      Problem: Risk for Impaired Skin Integrity  Goal: Tissue integrity - skin and mucous membranes  Structural intactness and normal physiological function of skin and  mucous membranes.   Outcome: Ongoing      Problem: Musculor/Skeletal Functional Status  Goal: Highest potential functional level  Outcome: Ongoing

## 2017-09-19 NOTE — Progress Notes (Signed)
[place]  HOSPITALIST PROGRESS NOTE                       Name:  Judy Velasquez DOB/Age/Sex: 22-Sep-1929  (83 y.o. female)   MRN & CSN:  [id number] & [id number] Admission Date/Time: 09/16/2017 12:35 AM   Location:  4116/4116-A Attending:  Jeris Penta, MD                                                  HPI  Judy Velasquez is a 83 y.o. female who was admitted on 12/25 with nausea/vomiting.    SUBJECTIVE  -denies any abdominal pain, nausea/vomiting, no shortness of breath, doing okay on room air, appearing weak    10 point review of systems reviewed and negative unless noted above.    ALLERGIES:   Allergies   Allergen Reactions   . Celebrex [Celecoxib] Shortness Of Breath, Swelling and Anaphylaxis     "Swelling Of The Esophagus"   . Fish Allergy Anaphylaxis   . Lisinopril Other (See Comments)     Dry Mouth   . Neurontin [Gabapentin] Other (See Comments)     Dizziness   . Norvasc [Amlodipine Besylate] Other (See Comments)     Edema Of Legs   . Other      "Allergic To Certain Seafoods Causing Headaches, Difficulty Breathing"   . Shellfish Allergy Anaphylaxis   . Ciprofloxacin Rash   . Lipitor Other (See Comments)     Ache   . Losartan      Per nephrologist       PCP: [name], MD    PAST MEDICAL HISTORY, SURGICAL HISTORY, SOCIAL HISTORY and  HOME MEDICATIONS all reviewed.      OBJECTIVE  Vitals:    09/18/17 1812 09/18/17 2030 09/19/17 0400 09/19/17 1146   BP: (!) 187/76 (!) 139/57 (!) 174/70 (!) 164/73   Pulse: 71 58 62 63   Resp: 17 18 18 17    Temp: 97.4 F (36.3 C) 98.7 F (37.1 C) 98 F (36.7 C) 97.6 F (36.4 C)   TempSrc: Oral Oral Oral Oral   SpO2: 97% 100% 98% 98%   Weight:       Height:           PHYSICAL EXAM   GEN Awake female, sitting upright in bed in no apparent distress.  EYES Pupils are equally round.  No scleral erythema, discharge, or conjunctivitis  HENT Mucous membranes are moist. Oral pharynx without exudates, no evidence of thrush.  NECK Supple, no apparent thyromegaly or  masses.  RESP Clear to auscultation, no wheezes, rales or rhonchi.  Symmetric chest movement while on room air.  CARDIO/VASC S1/S2 auscultated. Regular rate without appreciable murmurs, rubs, or gallops. No JVD or carotid bruits. Peripheral pulses equal bilaterally and palpable. No peripheral edema.  GI Abdomen is soft without significant tenderness, masses, or guarding. Bowel sounds are normoactive. Rectal exam deferred.   GU No costovertebral angle tenderness. Normal appearing external genitalia.  HEME/LYMPH No palpable cervical lymphadenopathy and no hepatosplenomegaly. No petechiae or ecchymoses.  MSK Spontaneous movement of all extremities.  No gross joint deformities.  SKIN Normal coloration, warm, dry.  NEURO Cranial nerves appear grossly intact, normal speech, no lateralizing weakness.  PSYCH Awake, alert, oriented x 4.  Affect appropriate.    INTAKE: In: 480 [I.V.:480]  Out: 450   OUTPUT: In: 480   Out: 450 [Drains:450]    LABS  Recent Labs      09/17/17   0857  09/18/17   0529  09/19/17   0618   WBC  6.6  6.0  5.2   HGB  8.0*  7.9*  7.9*   HCT  24.8*  26.0*  25.7*   PLT  280  290  290      Recent Labs      09/17/17   0540  09/18/17   0529  09/19/17   0618   NA  138  137  136   K  4.0  3.9  4.1   CL  101  104  101   CO2  24  21  21    BUN  63*  61*  56*   CREATININE  3.6*  3.2*  3.0*     No results for input(s): AST, ALT, ALB, BILIDIR, BILITOT, ALKPHOS in the last 72 hours.  Recent Labs      09/17/17   0857   INR  1.02     No results for input(s): CKTOTAL, CKMB, CKMBINDEX, TROPONINT in the last 72 hours.       Abnormal labs for today noted      Imaging: CT abd/pelvis   Impression   Thickened lower esophagus-correlate for esophagitis.      Pancreatic atrophy with multiple intraparenchymal cystic masses.   Differential diagnosis includes, but is not limited, to chronic pancreatitis   with pseudocyst formation and IPMN (intraductal papillary mucinous neoplasm).      Large amount of stool within the proximal  colon and diverticulosis coli,   without evidence of diverticulitis or colitis.      4.7 cm fluid collection ventral to the left hip.        Upper extremity DVT study negative    ECHO:    Microbiology:  Blood culture:    Urine culture:    Sputum culture:    Procedures done this admission:    MEDS  Scheduled Meds:  . hydrALAZINE  100 mg Oral TID   . isosorbide mononitrate  30 mg Oral Daily   . influenza virus vaccine  0.5 mL Intramuscular Once   . sodium chloride flush  10 mL Intravenous 2 times per day   . bisacodyl  10 mg Rectal Once   . heparin (porcine)  5,000 Units Subcutaneous Q8H   . docusate sodium  100 mg Oral BID   . senna  2 tablet Oral BID   . polyethylene glycol  17 g Oral Daily   . pantoprazole  40 mg Oral BID AC   . anastrozole  1 mg Oral Daily   . carvedilol  3.125 mg Oral BID   . iron polysaccharides  150 mg Oral BID     Continuous Infusions:  PRN Meds:acetaminophen, sodium chloride flush, phenol, magic (miracle) mouthwash, ondansetron, prochlorperazine, acetaminophen        ASSESSMENT and [facility] Day: 4    AKI on CKD IV  -occurred in the setting of volume depletion/emesis  - appreciate nephrology consult, IVF to be stopped later today, monitor BMP    Erosive esophagitis - LA grade C  -PPI, odynophagia is better    Underweight Body mass index is 20.62 kg/m.    Left hip fluid collection -incidental finding on imaging - aspirated in IR 12/26  -12/27 - reviewed synovial fluid studies, WBC not suggestive of infection, follow  up ortho attending recommendation    RUE swelling  -[place] negative    Pancreatic atrophy with multiple intraparenchymal cystic masse on CT.Differential diagnosis includes, but is not limited, to chronic pancreatitis with pseudocyst formation and IPMN (intraductal papillary mucinous neoplasm).  -CA 19-9 is high, GI recommended referral to [place] for EUS      HTN  -12/27 - BP high today - restarted home med Imdur  -noted she is actually on hydralazine 100 mg TID at home  rather than the listed 50 mg TID, so will adjust the dose for 12/28    Significant constipation  -12/27 - resolved with laxatives, having diarrhea now    Anemia, macrocytic - profile reviewed - consider outpatient hematology eva    Reported left face/neck shingles at end of nov  - looks healed up    Hx of DCIS right breast s/p lumpectomy in 2012  -continue anstrazole      -awaiting placement                Disp: rehab    Diet DIET GENERAL; Low Sodium (2 GM); Dysphagia II Mechanically Altered   DVT Prophylaxis []  Lovenox, [x]   Heparin, []  SCDs, []  Ambulation   GI Prophylaxis []  PPI,  []  H2 Blocker,  []  Carafate,  []  Diet/Tube Feeds   Code Status Full Code   Disposition Patient requires continued admission due to debility   CMS Level of Risk []  Low, []  Moderate,[x]   High  Patient's risk as above due to debility     Haru Shaff, MD 09/19/2017 2:30 PM

## 2017-09-19 NOTE — Progress Notes (Signed)
Nephrology Progress Note        [address], [place] [address]  Phone: [phone]  Office Hours: 8:30AM - 4:30PM  Monday - Friday       [facility]  Claretta Fraise, MD  [address], DO  [place],  [place] [address]  PHONE: [phone]  FAX: [phone]    09/19/2017 5:17 PM  Subjective:   Admit Date: 09/16/2017  PCP: [name], MD  Interval History: better  Rt arm swelling- DVT scan negative yesterday  Making urine  Diet: DIET GENERAL; Low Sodium (2 GM); Dysphagia II Mechanically Altered      Data:   Scheduled Meds:  . hydrALAZINE  100 mg Oral TID   . isosorbide dinitrate  30 mg Oral TID AC   . influenza virus vaccine  0.5 mL Intramuscular Once   . sodium chloride flush  10 mL Intravenous 2 times per day   . bisacodyl  10 mg Rectal Once   . heparin (porcine)  5,000 Units Subcutaneous Q8H   . docusate sodium  100 mg Oral BID   . senna  2 tablet Oral BID   . polyethylene glycol  17 g Oral Daily   . pantoprazole  40 mg Oral BID AC   . anastrozole  1 mg Oral Daily   . carvedilol  3.125 mg Oral BID   . iron polysaccharides  150 mg Oral BID     Continuous Infusions:    PRN Meds:acetaminophen, sodium chloride flush, phenol, magic (miracle) mouthwash, ondansetron, prochlorperazine, acetaminophen  I/O last 3 completed shifts:  In: 480 [I.V.:480]  Out: 450 [Drains:450]  No intake/output data recorded.    Intake/Output Summary (Last 24 hours) at 09/19/17 1717  Last data filed at 09/19/17 0612   Gross per 24 hour   Intake              480 ml   Output              450 ml   Net               30 ml       CBC:   Recent Labs      09/17/17   0857  09/18/17   0529  09/19/17   0618   WBC  6.6  6.0  5.2   HGB  8.0*  7.9*  7.9*   PLT  280  290  290       BMP:    Recent Labs      09/17/17   0540  09/18/17   0529  09/19/17   0618   NA  138  137  136   K  4.0  3.9  4.1   CL  101  104  101   CO2  24  21  21    BUN  63*  61*  56*   CREATININE   3.6*  3.2*  3.0*   GLUCOSE  82  94  85     Hepatic: No results for input(s): AST, ALT, ALB, BILITOT, ALKPHOS in the last 72 hours.  Troponin: No results for input(s): TROPONINI in the last 72 hours.  BNP: No results for input(s): BNP in the last 72 hours.  Lipids: No results for input(s): CHOL, HDL in the last 72 hours.    Invalid input(s): LDLCALCU  ABGs: No results found for: PHART, PO2ART, PCO2ART  INR:   Recent Labs      09/17/17   0857   INR  1.02       Objective:   Vitals: BP (!) 164/73   Pulse 63   Temp 97.6 F (36.4 C) (Oral)   Resp 17   Ht 4' 9.24" (1.454 m)   Wt 96 lb 1.6 oz (43.6 kg)   SpO2 98%   BMI 20.62 kg/m   General appearance: alert and cooperative with exam, in no acute distress  HEENT: normocephalic, atraumatic,   Neck: supple, trachea midline  Lungs:breathing comfortably on room air  Heart:: regular rate and rhythm, S1, S2 normal,  Abdomen:  non distended,   Extremities: extremities atraumatic, no cyanosis or edema  Neurologic: alert, oriented, follows commands, interactive    Assessment and Plan:   - AKI: cr peak of 4.3, from volume depletion in setting of emesis, No obstructive uropathy on ct a/p  - CKD4  - HTN  - Emesis/Esophagitis  - Constipation  - 4.7cm fluid collection ventral to left hip: s/p fluid aspiration  - acute anemia: No signs of iron deficiency per iron profile      Plan:  Cr down to 3.0 today  Stop IVF  BP up- restart home meds  Will follow  Discussed with Dr Quita Skye                  Electronically signed by Claretta Fraise, MD on 09/19/2017 at 5:17 PM    [facility]  Claretta Fraise, MD  [address]ennis Harding, DO  [place] [address]  PHONE: [phone]  FAX: [phone]

## 2017-09-19 NOTE — Progress Notes (Signed)
Hip fluid cell count not suspicious for infection  Gram stain negative  Cultures negative so far  At discharge patient to follow up with surgeon who performed total hip in 2-4 weeks  If cultures grow something then would need to have prosthesis removed by a total joint surgeon as an elective procedure and staged revision surgery  If cultures negative then no further treatment necessary

## 2017-09-19 NOTE — Progress Notes (Signed)
Feeling better  Abdomen soft, non-tender, non-distended  CA 19-9 mildly elevated  Added history is that sister had pancreas cancer  Impression:    1) duodenal ulcer    2) erosive esophagitis   3) cystic lesions pancreas, elevated CA 19-9,. FH pancreas cancer- suggest outpatient referral to [facility] for EUS and pancreas biopsy      Plan:   1) continue PPI   2) avoid NSAIDs   3) await H pylori assay and treat if positive   4) she will call my office as outpatient to arrange EUS (written instructions given to patient)

## 2017-09-19 NOTE — Care Coordination-Inpatient (Signed)
Dr. Evelena Leyden accepted patient to swing bed, still waiting on precert.  TS

## 2017-09-19 NOTE — Care Coordination-Inpatient (Signed)
Initiated precert with [employer]  Ref # [id number].  Holland Falling will reach out for clinicals within 24-48 hours and to provide official approval or denial.  Call [phone] option 3 if need to contact [employer].  Will update CM as soon as Holland Falling reaches out.  TS

## 2017-09-20 LAB — URINALYSIS WITH MICROSCOPIC
Bilirubin Urine: NEGATIVE MG/DL
Glucose, Urine: NEGATIVE MG/DL
Ketones, Urine: NEGATIVE MG/DL
Nitrite Urine, Quantitative: NEGATIVE
Protein, UA: >500 MG/DL — AB
RBC, UA: 28 /HPF — ABNORMAL HIGH (ref 0–6)
Specific Gravity, UA: 1.012 (ref 1.001–1.035)
Squam Epithel, UA: 5 /HPF
Trichomonas, UA: NONE SEEN /HPF
Urobilinogen, Urine: NORMAL MG/DL (ref 0.2–1.0)
WBC, UA: 731 /HPF — ABNORMAL HIGH (ref 0–5)
pH, Urine: 5 (ref 5.0–8.0)

## 2017-09-20 LAB — BASIC METABOLIC PANEL
Anion Gap: 9 (ref 4–16)
BUN: 53 MG/DL — ABNORMAL HIGH (ref 6–23)
CO2: 24 MMOL/L (ref 21–32)
Calcium: 7.5 MG/DL — ABNORMAL LOW (ref 8.3–10.6)
Chloride: 103 mMol/L (ref 99–110)
Creatinine: 3 MG/DL — ABNORMAL HIGH (ref 0.6–1.1)
GFR African American: 18 mL/min/{1.73_m2} — ABNORMAL LOW (ref >60)
GFR Non-African American: 15 mL/min/{1.73_m2} — ABNORMAL LOW (ref >60)
Glucose: 89 MG/DL (ref 70–99)
Potassium: 4.1 MMOL/L (ref 3.5–5.1)
Sodium: 136 MMOL/L (ref 135–145)

## 2017-09-20 LAB — CBC WITH AUTO DIFFERENTIAL
Basophils %: 0.4 % (ref 0–1)
Basophils Absolute: 0 10*3/uL
Eosinophils %: 2.6 % (ref 0–3)
Eosinophils Absolute: 0.1 10*3/uL
Hematocrit: 21.9 % — ABNORMAL LOW (ref 37–47)
Hemoglobin: 7.1 GM/DL — ABNORMAL LOW (ref 12.5–16.0)
Immature Neutrophil %: 0.4 % (ref 0–0.43)
Lymphocytes %: 20.9 % — ABNORMAL LOW (ref 24–44)
Lymphocytes Absolute: 1 10*3/uL
MCH: 35.3 PG — ABNORMAL HIGH (ref 27–31)
MCHC: 32.4 % (ref 32.0–36.0)
MCV: 109 FL — ABNORMAL HIGH (ref 78–100)
MPV: 9 FL (ref 7.5–11.1)
Monocytes %: 11.9 % — ABNORMAL HIGH (ref 0–4)
Monocytes Absolute: 0.5 10*3/uL
Nucleated RBC %: 0 %
Platelets: 252 10*3/uL (ref 140–440)
RBC: 2.01 10*6/uL — ABNORMAL LOW (ref 4.2–5.4)
RDW: 15.9 % — ABNORMAL HIGH (ref 11.7–14.9)
Segs Absolute: 2.9 10*3/uL
Segs Relative: 63.8 % (ref 36–66)
Total Immature Neutrophil: 0.02 10*3/uL
Total Nucleated RBC: 0 10*3/uL
WBC: 4.5 10*3/uL (ref 4.0–10.5)

## 2017-09-20 LAB — MAGNESIUM: Magnesium: 3.3 mg/dl — ABNORMAL HIGH (ref 1.8–2.4)

## 2017-09-20 MED ORDER — TRAMADOL HCL 50 MG PO TABS
50 MG | Freq: Four times a day (QID) | ORAL | Status: DC | PRN
Start: 2017-09-20 — End: 2017-09-22
  Administered 2017-09-21 – 2017-09-22 (×4): 25 mg via ORAL

## 2017-09-20 MED ORDER — TRAMADOL HCL 50 MG PO TABS
50 MG | Freq: Once | ORAL | Status: AC
Start: 2017-09-20 — End: 2017-09-20
  Administered 2017-09-20: 09:00:00 25 mg via ORAL

## 2017-09-20 MED FILL — ANASTROZOLE 1 MG PO TABS: 1 mg | ORAL | Qty: 1

## 2017-09-20 MED FILL — FERREX 150 150 MG PO CAPS: 150 mg | ORAL | Qty: 1

## 2017-09-20 MED FILL — POLYETHYLENE GLYCOL 3350 17 G PO PACK: 17 g | ORAL | Qty: 1

## 2017-09-20 MED FILL — ONDANSETRON HCL 4 MG/2ML IJ SOLN: 4 MG/2ML | INTRAMUSCULAR | Qty: 2

## 2017-09-20 MED FILL — TRAMADOL HCL 50 MG PO TABS: 50 mg | ORAL | Qty: 1

## 2017-09-20 MED FILL — SENNA-LAX 8.6 MG PO TABS: 8.6 mg | ORAL | Qty: 2

## 2017-09-20 MED FILL — HYDRALAZINE HCL 50 MG PO TABS: 50 mg | ORAL | Qty: 2

## 2017-09-20 MED FILL — HEPARIN SODIUM (PORCINE) 5000 UNIT/ML IJ SOLN: 5000 [IU]/mL | INTRAMUSCULAR | Qty: 1

## 2017-09-20 MED FILL — ISOSORBIDE DINITRATE 10 MG PO TABS: 10 mg | ORAL | Qty: 1

## 2017-09-20 MED FILL — CARVEDILOL 3.125 MG PO TABS: 3.125 mg | ORAL | Qty: 1

## 2017-09-20 MED FILL — DOK 100 MG PO CAPS: 100 mg | ORAL | Qty: 1

## 2017-09-20 MED FILL — PANTOPRAZOLE SODIUM 40 MG PO TBEC: 40 mg | ORAL | Qty: 1

## 2017-09-20 MED FILL — PAIN & FEVER 325 MG PO TABS: 325 mg | ORAL | Qty: 2

## 2017-09-20 NOTE — Plan of Care (Signed)
Problem: Falls - Risk of:  Goal: Will remain free from falls  Will remain free from falls   Outcome: Ongoing    Goal: Absence of physical injury  Absence of physical injury   Outcome: Ongoing      Problem: Pain:  Goal: Pain level will decrease  Pain level will decrease   Outcome: Ongoing    Goal: Control of acute pain  Control of acute pain   Outcome: Ongoing    Goal: Control of chronic pain  Control of chronic pain   Outcome: Ongoing      Problem: Risk for Impaired Skin Integrity  Goal: Tissue integrity - skin and mucous membranes  Structural intactness and normal physiological function of skin and  mucous membranes.   Outcome: Ongoing      Problem: Musculor/Skeletal Functional Status  Goal: Highest potential functional level  Outcome: Ongoing

## 2017-09-20 NOTE — Progress Notes (Signed)
[facility] [address] [place] [address]  Phone: [phone]  Office Hours: 8:30AM - 4:30PM  Monday - Friday     BP better  Creat stable at 3  Will follow

## 2017-09-20 NOTE — Progress Notes (Signed)
[facility]  HOSPITALIST PROGRESS NOTE                       Name:  Judy Velasquez DOB/Age/Sex: 09-Jun-1929  (83 y.o. female)   MRN & CSN:  [id number] & [id number] Admission Date/Time: 09/16/2017 12:35 AM   Location:  4116/4116-A Attending:  Jeris Penta, MD                                                  HPI  Judy Velasquez is a 83 y.o. female who was admitted on 12/25 with nausea/vomiting.    SUBJECTIVE  -reports leg pains over night which is better this morning, she reports this to be chronic and happens intermittently. No other acute issues and awaiting placement    10 point review of systems reviewed and negative unless noted above.    ALLERGIES:   Allergies   Allergen Reactions   . Celebrex [Celecoxib] Shortness Of Breath, Swelling and Anaphylaxis     "Swelling Of The Esophagus"   . Fish Allergy Anaphylaxis   . Lisinopril Other (See Comments)     Dry Mouth   . Neurontin [Gabapentin] Other (See Comments)     Dizziness   . Norvasc [Amlodipine Besylate] Other (See Comments)     Edema Of Legs   . Other      "Allergic To Certain Seafoods Causing Headaches, Difficulty Breathing"   . Shellfish Allergy Anaphylaxis   . Ciprofloxacin Rash   . Lipitor Other (See Comments)     Ache   . Losartan      Per nephrologist       PCP: [name], MD    PAST MEDICAL HISTORY, SURGICAL HISTORY, SOCIAL HISTORY and  HOME MEDICATIONS all reviewed.      OBJECTIVE  Vitals:    09/19/17 1757 09/19/17 2015 09/20/17 0602 09/20/17 1045   BP: (!) 166/74 (!) 175/65 (!) 145/79 (!) 141/76   Pulse: 65 59 65 69   Resp: 20 16 16 17    Temp: 97.9 F (36.6 C) 98 F (36.7 C) 97.8 F (36.6 C) 98.1 F (36.7 C)   TempSrc: Oral Oral Oral Oral   SpO2: 99% 99% 98% 98%   Weight:       Height:           PHYSICAL EXAM   GEN Awake female, sitting upright in bed in no apparent distress.  EYES Pupils are equally round.  No scleral erythema, discharge, or conjunctivitis  HENT Mucous membranes are moist. Oral pharynx without exudates, no evidence of  thrush.  NECK Supple, no apparent thyromegaly or masses.  RESP Clear to auscultation, no wheezes, rales or rhonchi.  Symmetric chest movement while on room air.  CARDIO/VASC S1/S2 auscultated. Regular rate without appreciable murmurs, rubs, or gallops. No JVD or carotid bruits. Peripheral pulses equal bilaterally and palpable. No peripheral edema.  GI Abdomen is soft without significant tenderness, masses, or guarding. Bowel sounds are normoactive. Rectal exam deferred.   GU No costovertebral angle tenderness. Normal appearing external genitalia.  HEME/LYMPH No palpable cervical lymphadenopathy and no hepatosplenomegaly. No petechiae or ecchymoses.  MSK Spontaneous movement of all extremities.  No gross joint deformities.  SKIN Normal coloration, warm, dry.  NEURO Cranial nerves appear grossly intact, normal speech, no lateralizing weakness.  PSYCH Awake, alert, oriented x 4.  Affect appropriate.    INTAKE: In: -   Out: 100   OUTPUT: In: -   Out: 100 [Urine:100]    LABS  Recent Labs      09/18/17   0529  09/19/17   0618  09/20/17   0728   WBC  6.0  5.2  4.5   HGB  7.9*  7.9*  7.1*   HCT  26.0*  25.7*  21.9*   PLT  290  290  252      Recent Labs      09/18/17   0529  09/19/17   0618  09/20/17   0728   NA  137  136  136   K  3.9  4.1  4.1   CL  104  101  103   CO2  21  21  24    BUN  61*  56*  53*   CREATININE  3.2*  3.0*  3.0*     No results for input(s): AST, ALT, ALB, BILIDIR, BILITOT, ALKPHOS in the last 72 hours.  No results for input(s): INR in the last 72 hours.  No results for input(s): CKTOTAL, CKMB, CKMBINDEX, TROPONINT in the last 72 hours.       Abnormal labs for today noted      Imaging: CT abd/pelvis   Impression   Thickened lower esophagus-correlate for esophagitis.      Pancreatic atrophy with multiple intraparenchymal cystic masses.   Differential diagnosis includes, but is not limited, to chronic pancreatitis   with pseudocyst formation and IPMN (intraductal papillary mucinous neoplasm).      Large  amount of stool within the proximal colon and diverticulosis coli,   without evidence of diverticulitis or colitis.      4.7 cm fluid collection ventral to the left hip.        Upper extremity DVT study negative    ECHO:    Microbiology:  Blood culture:    Urine culture:    Sputum culture:    Procedures done this admission:    MEDS  Scheduled Meds:  . hydrALAZINE  100 mg Oral TID   . isosorbide dinitrate  30 mg Oral TID AC   . influenza virus vaccine  0.5 mL Intramuscular Once   . sodium chloride flush  10 mL Intravenous 2 times per day   . bisacodyl  10 mg Rectal Once   . heparin (porcine)  5,000 Units Subcutaneous Q8H   . docusate sodium  100 mg Oral BID   . senna  2 tablet Oral BID   . polyethylene glycol  17 g Oral Daily   . pantoprazole  40 mg Oral BID AC   . anastrozole  1 mg Oral Daily   . carvedilol  3.125 mg Oral BID   . iron polysaccharides  150 mg Oral BID     Continuous Infusions:  PRN Meds:traMADol, acetaminophen, sodium chloride flush, phenol, magic (miracle) mouthwash, ondansetron, prochlorperazine, acetaminophen        ASSESSMENT and [facility] Day: 5    AKI on CKD IV  -occurred in the setting of volume depletion/emesis  - appreciate nephrology consult, IVF to be stopped later today, monitor BMP    Erosive esophagitis - LA grade C  -PPI, odynophagia is better    Underweight Body mass index is 20.62 kg/m.    Left hip fluid collection -incidental finding on imaging - aspirated in IR 12/26  -12/27 - reviewed synovial fluid studies, WBC not suggestive  of infection, follow up ortho attending recommendation    RUE swelling  -[place] negative    Pancreatic atrophy with multiple intraparenchymal cystic masse on CT.Differential diagnosis includes, but is not limited, to chronic pancreatitis with pseudocyst formation and IPMN (intraductal papillary mucinous neoplasm).  -CA 19-9 is high, GI recommended referral to [place] for EUS      HTN  -12/27 - BP high today - restarted home med Imdur  -noted she is  actually on hydralazine 100 mg TID at home rather than the listed 50 mg TID, so will adjust the dose for 12/28    Significant constipation  -12/27 - resolved with laxatives, having diarrhea now    Anemia, macrocytic - profile reviewed - consider outpatient hematology eva    Reported left face/neck shingles at end of nov  - looks healed up    Hx of DCIS right breast s/p lumpectomy in 2012  -continue anstrazole      -awaiting placement                Disp: rehab    Diet DIET GENERAL; Low Sodium (2 GM); Dysphagia II Mechanically Altered   DVT Prophylaxis []  Lovenox, [x]   Heparin, []  SCDs, []  Ambulation   GI Prophylaxis []  PPI,  []  H2 Blocker,  []  Carafate,  []  Diet/Tube Feeds   Code Status Full Code   Disposition Patient requires continued admission due to debility   CMS Level of Risk []  Low, []  Moderate,[x]   High  Patient's risk as above due to debility     Lashawnda Hancox, MD 09/20/2017 11:00 AM

## 2017-09-20 NOTE — Progress Notes (Signed)
Physical Therapy    Physical Therapy Treatment Note  Name: Judy Velasquez MRN: [id number] DOB:   1928/12/21   Date:  09/20/2017   Admission Date: 09/16/2017 Room:  4116/4116-A   Restrictions/Precautions:  Restrictions/Precautions  Restrictions/Precautions: General Precautions, Fall Risk, Up as Tolerated  Implants present? :  (bil THR, R TKR)       Subjective:  Patient states: Feeling pretty good other than she is nauseous when she first gets moving   Pain:   Location, Type, Intensity (0/10 to 10/10):  Denies   Objective:    Observation:    Sitting up in recliner. Vitals stable on room air.  Patient agreeable to therapy, and especially would like to walk.  Treatment, including education/measures:  Transfers:  Sit to Stand: CGA  Stand to sit: CGA  Sit to stand and reverse on commode with CGA    Patient ambulated 119ft with RW with CGA.      Patient washed up sitting in chair with water on bedside table.  Able to do all tasks independently with assist for washing lower legs, feet and back.  Patient also required assist for peri-care after going to restroom.      Assessment / Impression:    Patient's tolerance of treatment:  good  Adverse Reaction: fatigue   Significant change in status and impact:  no  Barriers to improvement:  Activity tolerance, balance, strength, fear     Plan for Next Session:    Continue to progress ambulation distance, address stairs, strengthen, activity tolerance, balance, transfers   Time in:  0935  Time out: 1023  Timed treatment minutes:  48  Total treatment time:  35    Previously filed items:  Social/Functional History  Lives With:  (granddtr and her 36 y.o. son; son lives next door)  Type of Home: House  Home Layout: Two level, Able to Live on Main level with bedroom/bathroom  Home Access: Stairs to enter with rails  Entrance Stairs - Number of Steps: 3  Entrance Stairs - Rails: Both  Bathroom Shower/Tub: Civil engineer, contracting with back  Biochemist, clinical: Soil scientist: Haematologist: 4 wheeled walker, Radio producer, Teacher, English as a foreign language, IT sales professional, Theatre stage manager Help From: Family  ADL Assistance:  (granddtr provides sup for bathing d/t prior fall out of tub)  Homemaking Assistance:  (granddtr is primary for laundry, pt folds laundry; granddtr does all addtl cleaning; pt does some meal prep - mainly uses microwave)  Homemaking Responsibilities: No  Ambulation Assistance:  (4WW at all times)  Transfer Assistance: Needs assistance (needs some A w/tub transfer; has A from granddtr getting in/out of bed several times a night d/t frequent urination)  Active Driver: No  Occupation: Retired  Additional Comments: pt independent prior to recent cataract sx. now requires increased assist for bathing, homemaking, transportation. Still mod I for gait, transfers, dressing. [name] is w/pt all the time but pt states she is worried about her because she is not getting sleep with assisting her and taking care of her 28 y.o. son. Pt's son is unable to A - disabled per pt.  Short term goals  Time Frame for Short term goals: one week  Short term goal 1: pt will increase tinetti score to 18  Short term goal 2: pt will transfer consistently at SBA with good hand placement   Short term goal 3: pt will ambulate 250 ft, safely navigating obstacles with LRAD        Electronically signed by:  Eulis Foster, PTA  09/20/2017, 12:40 PM

## 2017-09-20 NOTE — Progress Notes (Addendum)
The nurse called regarding strong odor of urine and cloudy appearance. UA showed pyuria. The patient is asymptomatic, afebrile, no dysuria/frequency/urgency. Will hold antibiotics at this time. Encourage oral fluid intake.    The patient has persistent bilateral leg pain, unresponsive to Tylenol. Tramadol 25 mg 1 ordered.

## 2017-09-21 LAB — BASIC METABOLIC PANEL
Anion Gap: 10 (ref 4–16)
BUN: 50 MG/DL — ABNORMAL HIGH (ref 6–23)
CO2: 23 MMOL/L (ref 21–32)
Calcium: 7.7 MG/DL — ABNORMAL LOW (ref 8.3–10.6)
Chloride: 104 mMol/L (ref 99–110)
Creatinine: 3 MG/DL — ABNORMAL HIGH (ref 0.6–1.1)
GFR African American: 18 mL/min/{1.73_m2} — ABNORMAL LOW (ref >60)
GFR Non-African American: 15 mL/min/{1.73_m2} — ABNORMAL LOW (ref >60)
Glucose: 88 MG/DL (ref 70–99)
Potassium: 4.2 MMOL/L (ref 3.5–5.1)
Sodium: 137 MMOL/L (ref 135–145)

## 2017-09-21 LAB — CULTURE, BODY FLUID: Gram Smear: NONE SEEN

## 2017-09-21 LAB — CBC WITH AUTO DIFFERENTIAL
Basophils %: 0.6 % (ref 0–1)
Basophils Absolute: 0 10*3/uL
Eosinophils %: 1.9 % (ref 0–3)
Eosinophils Absolute: 0.1 10*3/uL
Hematocrit: 24.7 % — ABNORMAL LOW (ref 37–47)
Hemoglobin: 7.7 GM/DL — ABNORMAL LOW (ref 12.5–16.0)
Immature Neutrophil %: 0.4 % (ref 0–0.43)
Lymphocytes %: 21 % — ABNORMAL LOW (ref 24–44)
Lymphocytes Absolute: 1 10*3/uL
MCH: 34.5 PG — ABNORMAL HIGH (ref 27–31)
MCHC: 31.2 % — ABNORMAL LOW (ref 32.0–36.0)
MCV: 110.8 FL — ABNORMAL HIGH (ref 78–100)
MPV: 9.3 FL (ref 7.5–11.1)
Monocytes %: 11.4 % — ABNORMAL HIGH (ref 0–4)
Monocytes Absolute: 0.5 10*3/uL
Nucleated RBC %: 0 %
Platelets: 266 10*3/uL (ref 140–440)
RBC: 2.23 10*6/uL — ABNORMAL LOW (ref 4.2–5.4)
RDW: 15.9 % — ABNORMAL HIGH (ref 11.7–14.9)
Segs Absolute: 3.2 10*3/uL
Segs Relative: 64.7 % (ref 36–66)
Total Immature Neutrophil: 0.02 10*3/uL
Total Nucleated RBC: 0 10*3/uL
WBC: 4.8 10*3/uL (ref 4.0–10.5)

## 2017-09-21 LAB — MAGNESIUM: Magnesium: 3.3 mg/dl — ABNORMAL HIGH (ref 1.8–2.4)

## 2017-09-21 MED FILL — CARVEDILOL 3.125 MG PO TABS: 3.125 mg | ORAL | Qty: 1

## 2017-09-21 MED FILL — SENNA-LAX 8.6 MG PO TABS: 8.6 mg | ORAL | Qty: 2

## 2017-09-21 MED FILL — PANTOPRAZOLE SODIUM 40 MG PO TBEC: 40 mg | ORAL | Qty: 1

## 2017-09-21 MED FILL — FERREX 150 150 MG PO CAPS: 150 mg | ORAL | Qty: 1

## 2017-09-21 MED FILL — ISOSORBIDE DINITRATE 10 MG PO TABS: 10 mg | ORAL | Qty: 1

## 2017-09-21 MED FILL — HEPARIN SODIUM (PORCINE) 5000 UNIT/ML IJ SOLN: 5000 [IU]/mL | INTRAMUSCULAR | Qty: 1

## 2017-09-21 MED FILL — HYDRALAZINE HCL 50 MG PO TABS: 50 mg | ORAL | Qty: 2

## 2017-09-21 MED FILL — ANASTROZOLE 1 MG PO TABS: 1 mg | ORAL | Qty: 1

## 2017-09-21 MED FILL — TRAMADOL HCL 50 MG PO TABS: 50 mg | ORAL | Qty: 1

## 2017-09-21 MED FILL — DOK 100 MG PO CAPS: 100 mg | ORAL | Qty: 1

## 2017-09-21 MED FILL — FLUZONE QUADRIVALENT 0.5 ML IM SUSY: 0.5 mL | INTRAMUSCULAR | Qty: 0.5

## 2017-09-21 MED FILL — POLYETHYLENE GLYCOL 3350 17 G PO PACK: 17 g | ORAL | Qty: 1

## 2017-09-21 NOTE — Progress Notes (Signed)
Feeling better  Eating some  Clo-Test negative for H pylori  Impression:               1) duodenal ulcer               2) erosive esophagitis              3) cystic lesions pancreas, elevated CA 19-9,. FH pancreas cancer-discussed with her and she wants to evaluate further-- suggest outpatient referral to [place] for EUS and pancreas biopsy      Plan:              1) continue PPI indefinitely              2) avoid NSAIDs              3) she will call my office as outpatient to arrange EUS (written instructions given to patient)    I will sign off now- call if need to see again

## 2017-09-21 NOTE — Plan of Care (Signed)
Problem: Falls - Risk of:  Goal: Will remain free from falls  Will remain free from falls   Outcome: Ongoing    Goal: Absence of physical injury  Absence of physical injury   Outcome: Ongoing      Problem: Pain:  Goal: Pain level will decrease  Pain level will decrease   Outcome: Ongoing    Goal: Control of acute pain  Control of acute pain   Outcome: Ongoing    Goal: Control of chronic pain  Control of chronic pain   Outcome: Ongoing      Problem: Risk for Impaired Skin Integrity  Goal: Tissue integrity - skin and mucous membranes  Structural intactness and normal physiological function of skin and  mucous membranes.   Outcome: Ongoing      Problem: Musculor/Skeletal Functional Status  Goal: Highest potential functional level  Outcome: Ongoing

## 2017-09-21 NOTE — Progress Notes (Signed)
BP 160s  Creat 3  K normal  Will follw

## 2017-09-21 NOTE — Progress Notes (Signed)
[facility]  HOSPITALIST PROGRESS NOTE                       Name:  Judy Velasquez DOB/Age/Sex: 1929/03/06  (83 y.o. female)   MRN & CSN:  [id number] & [id number] Admission Date/Time: 09/16/2017 12:35 AM   Location:  4116/4116-A Attending:  Jeris Penta, MD                                                  HPI  Judy Velasquez is a 83 y.o. female who was admitted on 12/25 with nausea/vomiting.    SUBJECTIVE  -appears comfortable and ano acute issues overnight, awaiting placement    10 point review of systems reviewed and negative unless noted above.    ALLERGIES:   Allergies   Allergen Reactions   . Celebrex [Celecoxib] Shortness Of Breath, Swelling and Anaphylaxis     "Swelling Of The Esophagus"   . Fish Allergy Anaphylaxis   . Lisinopril Other (See Comments)     Dry Mouth   . Neurontin [Gabapentin] Other (See Comments)     Dizziness   . Norvasc [Amlodipine Besylate] Other (See Comments)     Edema Of Legs   . Other      "Allergic To Certain Seafoods Causing Headaches, Difficulty Breathing"   . Shellfish Allergy Anaphylaxis   . Ciprofloxacin Rash   . Lipitor Other (See Comments)     Ache   . Losartan      Per nephrologist       PCP: [name], MD    PAST MEDICAL HISTORY, SURGICAL HISTORY, SOCIAL HISTORY and  HOME MEDICATIONS all reviewed.      OBJECTIVE  Vitals:    09/20/17 2145 09/21/17 0515 09/21/17 0819 09/21/17 1100   BP: (!) 187/70 (!) 167/72 (!) 190/83 (!) 151/79   Pulse: 64 66 63 69   Resp: 18 18  19    Temp: 97.6 F (36.4 C) 97.7 F (36.5 C)  97.9 F (36.6 C)   TempSrc: Oral Oral  Oral   SpO2: 99% 97%  98%   Weight:  98 lb 9.6 oz (44.7 kg)     Height:           PHYSICAL EXAM   GEN Awake female, sitting upright in bed in no apparent distress.  EYES Pupils are equally round.  No scleral erythema, discharge, or conjunctivitis  HENT Mucous membranes are moist. Oral pharynx without exudates, no evidence of thrush.  NECK Supple, no apparent thyromegaly or masses.  RESP Clear to auscultation, no wheezes,  rales or rhonchi.  Symmetric chest movement while on room air.  CARDIO/VASC S1/S2 auscultated. Regular rate without appreciable murmurs, rubs, or gallops. No JVD or carotid bruits. Peripheral pulses equal bilaterally and palpable. No peripheral edema.  GI Abdomen is soft without significant tenderness, masses, or guarding. Bowel sounds are normoactive. Rectal exam deferred.   GU No costovertebral angle tenderness. Normal appearing external genitalia.  HEME/LYMPH No palpable cervical lymphadenopathy and no hepatosplenomegaly. No petechiae or ecchymoses.  MSK Spontaneous movement of all extremities.  No gross joint deformities.  SKIN Normal coloration, warm, dry.  NEURO Cranial nerves appear grossly intact, normal speech, no lateralizing weakness.  PSYCH Awake, alert, oriented x 4.  Affect appropriate.    INTAKE: In: -   Out: 650   OUTPUT:  In: -   Out: [place]      09/19/17   0618  09/20/17   0728  09/21/17   0604   WBC  5.2  4.5  4.8   HGB  7.9*  7.1*  7.7*   HCT  25.7*  21.9*  24.7*   PLT  290  252  266      Recent Labs      09/19/17   0618  09/20/17   0728  09/21/17   0604   NA  136  136  137   K  4.1  4.1  4.2   CL  101  103  104   CO2  21  24  23    BUN  56*  53*  50*   CREATININE  3.0*  3.0*  3.0*     No results for input(s): AST, ALT, ALB, BILIDIR, BILITOT, ALKPHOS in the last 72 hours.  No results for input(s): INR in the last 72 hours.  No results for input(s): CKTOTAL, CKMB, CKMBINDEX, TROPONINT in the last 72 hours.       Abnormal labs for today noted      Imaging: CT abd/pelvis   Impression   Thickened lower esophagus-correlate for esophagitis.      Pancreatic atrophy with multiple intraparenchymal cystic masses.   Differential diagnosis includes, but is not limited, to chronic pancreatitis   with pseudocyst formation and IPMN (intraductal papillary mucinous neoplasm).      Large amount of stool within the proximal colon and diverticulosis coli,   without evidence of  diverticulitis or colitis.      4.7 cm fluid collection ventral to the left hip.        Upper extremity DVT study negative    ECHO:    Microbiology:  Blood culture:    Urine culture:    Sputum culture:    Procedures done this admission:    MEDS  Scheduled Meds:  . hydrALAZINE  100 mg Oral TID   . isosorbide dinitrate  30 mg Oral TID AC   . influenza virus vaccine  0.5 mL Intramuscular Once   . sodium chloride flush  10 mL Intravenous 2 times per day   . bisacodyl  10 mg Rectal Once   . heparin (porcine)  5,000 Units Subcutaneous Q8H   . docusate sodium  100 mg Oral BID   . senna  2 tablet Oral BID   . polyethylene glycol  17 g Oral Daily   . pantoprazole  40 mg Oral BID AC   . anastrozole  1 mg Oral Daily   . carvedilol  3.125 mg Oral BID   . iron polysaccharides  150 mg Oral BID     Continuous Infusions:  PRN Meds:traMADol, acetaminophen, sodium chloride flush, phenol, magic (miracle) mouthwash, ondansetron, prochlorperazine, acetaminophen        ASSESSMENT and [facility] Day: 6    AKI on CKD IV  -occurred in the setting of volume depletion/emesis  - appreciate nephrology consult, IVF to be stopped later today, monitor BMP    Erosive esophagitis - LA grade C  -PPI, odynophagia is better    Underweight Body mass index is 20.62 kg/m.    Left hip fluid collection -incidental finding on imaging - aspirated in IR 12/26  -12/27 - reviewed synovial fluid studies, WBC not suggestive of infection, follow up ortho attending recommendation    RUE swelling  -[place] negative  Pancreatic atrophy with multiple intraparenchymal cystic masse on CT.Differential diagnosis includes, but is not limited, to chronic pancreatitis with pseudocyst formation and IPMN (intraductal papillary mucinous neoplasm).  -CA 19-9 is high, GI recommended referral to [place] for EUS      HTN  -12/27 - BP high today - restarted home med Imdur  -noted she is actually on hydralazine 100 mg TID at home rather than the listed 50 mg TID, so will  adjust the dose for 12/28    Significant constipation  -12/27 - resolved with laxatives, having diarrhea now    Anemia, macrocytic - profile reviewed - consider outpatient hematology eva    Reported left face/neck shingles at end of nov  - looks healed up    Hx of DCIS right breast s/p lumpectomy in 2012  -continue anstrazole      -awaiting placement                Disp: rehab    Diet DIET GENERAL; Low Sodium (2 GM); Dysphagia II Mechanically Altered   DVT Prophylaxis []  Lovenox, [x]   Heparin, []  SCDs, []  Ambulation   GI Prophylaxis []  PPI,  []  H2 Blocker,  []  Carafate,  []  Diet/Tube Feeds   Code Status Full Code   Disposition Patient requires continued admission due to debility   CMS Level of Risk []  Low, []  Moderate,[x]   High  Patient's risk as above due to debility     Petrea Fredenburg, MD 09/21/2017 12:25 PM

## 2017-09-22 LAB — MAGNESIUM: Magnesium: 3.3 mg/dl — ABNORMAL HIGH (ref 1.8–2.4)

## 2017-09-22 MED ORDER — PILL SPLITTER MISC
Status: AC
Start: 2017-09-22 — End: 2017-09-22

## 2017-09-22 MED ORDER — TRAMADOL HCL 50 MG PO TABS
50 MG | Freq: Four times a day (QID) | ORAL | Status: DC | PRN
Start: 2017-09-22 — End: 2017-09-26
  Administered 2017-09-23 – 2017-09-26 (×8): 50 mg via ORAL

## 2017-09-22 MED FILL — SENNA-LAX 8.6 MG PO TABS: 8.6 mg | ORAL | Qty: 2

## 2017-09-22 MED FILL — ANASTROZOLE 1 MG PO TABS: 1 mg | ORAL | Qty: 1

## 2017-09-22 MED FILL — HEPARIN SODIUM (PORCINE) 5000 UNIT/ML IJ SOLN: 5000 [IU]/mL | INTRAMUSCULAR | Qty: 1

## 2017-09-22 MED FILL — ISOSORBIDE DINITRATE 10 MG PO TABS: 10 mg | ORAL | Qty: 1

## 2017-09-22 MED FILL — FERREX 150 150 MG PO CAPS: 150 mg | ORAL | Qty: 1

## 2017-09-22 MED FILL — TRAMADOL HCL 50 MG PO TABS: 50 mg | ORAL | Qty: 1

## 2017-09-22 MED FILL — DOK 100 MG PO CAPS: 100 mg | ORAL | Qty: 1

## 2017-09-22 MED FILL — PANTOPRAZOLE SODIUM 40 MG PO TBEC: 40 mg | ORAL | Qty: 1

## 2017-09-22 MED FILL — HYDRALAZINE HCL 50 MG PO TABS: 50 mg | ORAL | Qty: 2

## 2017-09-22 MED FILL — PILL SPLITTER MISC: Qty: 1

## 2017-09-22 MED FILL — POLYETHYLENE GLYCOL 3350 17 G PO PACK: 17 g | ORAL | Qty: 1

## 2017-09-22 MED FILL — CARVEDILOL 3.125 MG PO TABS: 3.125 mg | ORAL | Qty: 1

## 2017-09-22 NOTE — Progress Notes (Signed)
Physical Therapy    Physical Therapy Treatment Note  Name: Judy Velasquez MRN: [id number] DOB:   Mar 03, 1929   Date:  09/22/2017   Admission Date: 09/16/2017 Room:  4116/4116-A   Restrictions/Precautions:  Restrictions/Precautions  Restrictions/Precautions: General Precautions, Fall Risk, Up as Tolerated  Implants present? :  (bil THR, R TKR)       Communication with other providers:  Per Rn, pt okay to be seen   Subjective:  Patient states:  'It feels good to be up out of that chair"  Pain:   Location, Type, Intensity (0/10 to 10/10):  Face,head 6/10 (from shingles)  Objective:   Observation:    Sitting up in recliner, vitals stable on room air. Oriented to person, place, and time.  Agreeable and cooperative with therapy.   Treatment, including education/measures:  Pt performed sit><stand CGA for safety from recliner><RW. Inc VCs for UE sequencing.  Pt stood with single UE support while brushing hair CGA (~1 minute). Pt ambulated 163ft with RW CGA. Antalgic gait with dec stance time to LLE. No major LOB but fatigue noted with dec in pace. Pt performed step pivot turn to standard toilet and recliner with RW CGA w/ verbal cues for sequencing pivot turn. Sit><stand from standard toilet CGA with use of grab bar for sit>stand w/ VCs.   Assessment / Impression:    Patient's tolerance of treatment:  good  Adverse Reaction: no  Significant change in status and impact:  no  Barriers to improvement:  Activity tolerance, safety awareness, balance, strength, prolonged hospital stay, pain.  Continue to recommend SNF for short term rehab stay.   Plan for Next Session:    Progress with ambulation distance/trials, activity tolerance, dynamic balance, strengthening.   Time in:  1300  Time out:  1312  Timed treatment minutes:  12  Total treatment time:  12    Previously filed items:  Social/Functional History  Lives With:  (granddtr and her 53 y.o. son; son lives next door)  Type of Home: House  Home Layout: Two level, Able to Live on  Main level with bedroom/bathroom  Home Access: Stairs to enter with rails  Entrance Stairs - Number of Steps: 3  Entrance Stairs - Rails: Both  Bathroom Shower/Tub: Civil engineer, contracting with back  Biochemist, clinical: Soil scientist: Engineer, site: 4 wheeled walker, Radio producer, Teacher, English as a foreign language, IT sales professional, Theatre stage manager Help From: Family  ADL Assistance:  (granddtr provides sup for bathing d/t prior fall out of tub)  Homemaking Assistance:  (granddtr is primary for laundry, pt folds laundry; granddtr does all addtl cleaning; pt does some meal prep - mainly uses microwave)  Homemaking Responsibilities: No  Ambulation Assistance:  (4WW at all times)  Transfer Assistance: Needs assistance (needs some A w/tub transfer; has A from granddtr getting in/out of bed several times a night d/t frequent urination)  Active Driver: No  Occupation: Retired  Additional Comments: pt independent prior to recent cataract sx. now requires increased assist for bathing, homemaking, transportation. Still mod I for gait, transfers, dressing. [name] is w/pt all the time but pt states she is worried about her because she is not getting sleep with assisting her and taking care of her 51 y.o. son. Pt's son is unable to A - disabled per pt.  Short term goals  Time Frame for Short term goals: one week  Short term goal 1: pt will increase tinetti score to 18  Short term goal 2: pt will transfer consistently at  SBA with good hand placement   Short term goal 3: pt will ambulate 250 ft, safely navigating obstacles with LRAD        Electronically signed by:    Catalina Lunger, PT  09/22/2017, 1:13 PM

## 2017-09-22 NOTE — Progress Notes (Signed)
Nutrition Assessment    Type and Reason for Visit: Initial (length of stay )    Nutrition Recommendations: Continue current diet as tolerates   Offer frozen oral nutrition supplement at this time    Encourage consistent intake     Nutrition Assessment: Patient currently moderate nutrition risk with predicted suboptimal intake related to reduced appetite and esophagitis. Currently on dysphagia II diet, no caffeine, low sodium. Recent meal intake 76-100%.     Malnutrition Assessment:   Malnutrition Status: Insufficient data (asleep in bed on visit, did not arouse )   Context: Acute illness or injury    Nutrition Risk Level: Moderate    Nutrient Needs:   Estimated Daily Total Kcal: 1350-1575 (30-53 cal/kg maintenance)   Estimated Daily Protein (g): 42-50 (1-1.2 g/kg)   Estimated Daily Total Fluid (ml/day): 1300-1500 (1 ml/cal)    Nutrition Diagnosis:    Problem: Predicted suboptimal energy intake   Etiology: related to Difficulty swallowing, Other (Comment) (hx decreased appetite )    . Signs and symptoms:  as evidenced by Diet history of poor intake    Objective Information:   Nutrition-Focused Physical Findings: pain 8/10, very petite female    Wound Type: None   Current Nutrition Therapies:   Oral Diet Orders: Dysphagia 2, 2gm Sodium (no caffeine )    Oral Diet intake: 76-100%   Oral Nutrition Supplement (ONS) Orders: None   ONS intake:     Anthropometric Measures:   Ht: 4' 9.24" (145.4 cm)    Current Body Wt: 98 lb 8.7 oz (44.7 kg)   Usual Body Wt:  (per hx wt in past year 95-100# range)   % Weight Change:  ,  per records no significant loss in past 6 months   Ideal Body Wt: 93 lb (42.2 kg), % Ideal Body 105   BMI Classification: BMI 18.5 - 24.9 Normal Weight (BMI-21.2 )    Nutrition Interventions:   Continue current diet, Start ONS  Continued Inpatient Monitoring, Education not appropriate at this time, Coordination of Care    Nutrition Evaluation:    Evaluation: Goals set    Goals:  Patient will tolerate diet with meal intake at least 50-75%      Monitoring: Meal Intake, Supplement Intake, Diet Tolerance, Weight, Pertinent Labs, Chewing/Swallowing      Electronically signed by Pricilla Handler, RD, LD on 09/22/17 at 12:24 PM    Contact Number: [phone]

## 2017-09-22 NOTE — Progress Notes (Signed)
[facility]  HOSPITALIST PROGRESS NOTE                       Name:  Judy Velasquez DOB/Age/Sex: 08/17/1929  (83 y.o. female)   MRN & CSN:  [id number] & [id number] Admission Date/Time: 09/16/2017 12:35 AM   Location:  4116/4116-A Attending:  Jeris Penta, MD                                                  HPI  Judy Velasquez is a 83 y.o. female who was admitted on 12/25 with nausea/vomiting.    SUBJECTIVE  -reports intermittent arthritic pains, no shortness of breath, swelling on RUE improved. Awaiting precert    10 point review of systems reviewed and negative unless noted above.    ALLERGIES:   Allergies   Allergen Reactions   . Celebrex [Celecoxib] Shortness Of Breath, Swelling and Anaphylaxis     "Swelling Of The Esophagus"   . Fish Allergy Anaphylaxis   . Lisinopril Other (See Comments)     Dry Mouth   . Neurontin [Gabapentin] Other (See Comments)     Dizziness   . Norvasc [Amlodipine Besylate] Other (See Comments)     Edema Of Legs   . Other      "Allergic To Certain Seafoods Causing Headaches, Difficulty Breathing"   . Shellfish Allergy Anaphylaxis   . Ciprofloxacin Rash   . Lipitor Other (See Comments)     Ache   . Losartan      Per nephrologist       PCP: Judy Der, MD    PAST MEDICAL HISTORY, SURGICAL HISTORY, SOCIAL HISTORY and  HOME MEDICATIONS all reviewed.      OBJECTIVE  Vitals:    09/21/17 1629 09/21/17 2145 09/22/17 0500 09/22/17 1103   BP: (!) 140/76 (!) 163/70 125/88 (!) 153/87   Pulse: 71 73 73 63   Resp: 18 16 16 14    Temp: 98.1 F (36.7 C) 98.4 F (36.9 C) 98 F (36.7 C) 97.6 F (36.4 C)   TempSrc: Oral Oral Oral Oral   SpO2: 97% 99% 99% 99%   Weight:   98 lb 8.7 oz (44.7 kg)    Height:           PHYSICAL EXAM   GEN Awake female, sitting upright in bed in no apparent distress.  EYES Pupils are equally round.  No scleral erythema, discharge, or conjunctivitis  HENT Mucous membranes are moist. Oral pharynx without exudates, no evidence of thrush.  NECK Supple, no apparent  thyromegaly or masses.  RESP Clear to auscultation, no wheezes, rales or rhonchi.  Symmetric chest movement while on room air.  CARDIO/VASC S1/S2 auscultated. Regular rate without appreciable murmurs, rubs, or gallops. No JVD or carotid bruits. Peripheral pulses equal bilaterally and palpable. No peripheral edema.  GI Abdomen is soft without significant tenderness, masses, or guarding. Bowel sounds are normoactive. Rectal exam deferred.   GU No costovertebral angle tenderness. Normal appearing external genitalia.  HEME/LYMPH No palpable cervical lymphadenopathy and no hepatosplenomegaly. No petechiae or ecchymoses.  MSK Spontaneous movement of all extremities.  No gross joint deformities.  SKIN Normal coloration, warm, dry.  NEURO Cranial nerves appear grossly intact, normal speech, no lateralizing weakness.  PSYCH Awake, alert, oriented x 4.  Affect appropriate.    INTAKE: In: 300 [  P.O.:300]  Out: 300   OUTPUT: In: 300   Out: 300 [Urine:300]    LABS  Recent Labs      09/20/17   0728  09/21/17   0604   WBC  4.5  4.8   HGB  7.1*  7.7*   HCT  21.9*  24.7*   PLT  252  266      Recent Labs      09/20/17   0728  09/21/17   0604   NA  136  137   K  4.1  4.2   CL  103  104   CO2  24  23   BUN  53*  50*   CREATININE  3.0*  3.0*     No results for input(s): AST, ALT, ALB, BILIDIR, BILITOT, ALKPHOS in the last 72 hours.  No results for input(s): INR in the last 72 hours.  No results for input(s): CKTOTAL, CKMB, CKMBINDEX, TROPONINT in the last 72 hours.       Abnormal labs for today noted      Imaging: CT abd/pelvis   Impression   Thickened lower esophagus-correlate for esophagitis.      Pancreatic atrophy with multiple intraparenchymal cystic masses.   Differential diagnosis includes, but is not limited, to chronic pancreatitis   with pseudocyst formation and IPMN (intraductal papillary mucinous neoplasm).      Large amount of stool within the proximal colon and diverticulosis coli,   without evidence of diverticulitis or  colitis.      4.7 cm fluid collection ventral to the left hip.        Upper extremity DVT study negative    ECHO:    Microbiology:  Blood culture:    Urine culture:    Sputum culture:    Procedures done this admission:    MEDS  Scheduled Meds:  . pill splitter       . hydrALAZINE  100 mg Oral TID   . isosorbide dinitrate  30 mg Oral TID AC   . influenza virus vaccine  0.5 mL Intramuscular Once   . sodium chloride flush  10 mL Intravenous 2 times per day   . bisacodyl  10 mg Rectal Once   . heparin (porcine)  5,000 Units Subcutaneous Q8H   . docusate sodium  100 mg Oral BID   . senna  2 tablet Oral BID   . polyethylene glycol  17 g Oral Daily   . pantoprazole  40 mg Oral BID AC   . anastrozole  1 mg Oral Daily   . carvedilol  3.125 mg Oral BID   . iron polysaccharides  150 mg Oral BID     Continuous Infusions:  PRN Meds:traMADol, acetaminophen, sodium chloride flush, phenol, magic (miracle) mouthwash, ondansetron, prochlorperazine, acetaminophen        ASSESSMENT and [facility] Day: 7    AKI on CKD IV  -occurred in the setting of volume depletion/emesis  - appreciate nephrology consult, IVF to be stopped later today, monitor BMP    Erosive esophagitis - LA grade C  -PPI, odynophagia is better    Underweight Body mass index is 20.62 kg/m.    Left hip fluid collection -incidental finding on imaging - aspirated in IR 12/26  -12/27 - reviewed synovial fluid studies, WBC not suggestive of infection, follow up ortho attending recommendation    RUE swelling  -[place] negative    Pancreatic atrophy with multiple intraparenchymal cystic masse on CT.Differential diagnosis includes,  but is not limited, to chronic pancreatitis with pseudocyst formation and IPMN (intraductal papillary mucinous neoplasm).  -CA 19-9 is high, GI recommended referral to [place] for EUS      HTN  -12/27 - BP high today - restarted home med Imdur  -noted she is actually on hydralazine 100 mg TID at home rather than the listed 50 mg TID, so will  adjust the dose for 12/28    Significant constipation  -12/27 - resolved with laxatives, having diarrhea now    Anemia, macrocytic - profile reviewed - consider outpatient hematology eva    Reported left face/neck shingles at end of nov  - looks healed up    Hx of DCIS right breast s/p lumpectomy in 2012  -continue anstrazole      -awaiting placement                Disp: rehab    Diet DIET GENERAL; Low Sodium (2 GM); Dysphagia II Mechanically Altered   DVT Prophylaxis []  Lovenox, [x]   Heparin, []  SCDs, []  Ambulation   GI Prophylaxis []  PPI,  []  H2 Blocker,  []  Carafate,  []  Diet/Tube Feeds   Code Status Full Code   Disposition Patient requires continued admission due to debility   CMS Level of Risk []  Low, []  Moderate,[x]   High  Patient's risk as above due to debility     Kellyjo Edgren, MD 09/22/2017 12:09 PM

## 2017-09-22 NOTE — Plan of Care (Signed)
Problem: Falls - Risk of:  Goal: Will remain free from falls  Will remain free from falls   Outcome: Ongoing      Problem: Pain:  Goal: Pain level will decrease  Pain level will decrease   Outcome: Ongoing    Goal: Control of acute pain  Control of acute pain   Outcome: Ongoing

## 2017-09-22 NOTE — Care Coordination-Inpatient (Signed)
CM called Judy Velasquez to discuss precert for Swing Bed. CM was informed that precert is still pending. Judy Velasquez will call CM when precert is complete.   CM will continue to follow [facility]

## 2017-09-22 NOTE — Progress Notes (Signed)
[facility] [address], [place] [address]  Phone: [phone]  Office Hours: 8:30AM - 4:30PM  Monday - Friday     BP 125-140s  Awake alert  Creat was 3  Making urine  No pulm rales  Ok from renal for discharge  Follow up in office in [place] in 2 weeks

## 2017-09-22 NOTE — Progress Notes (Signed)
Occupational Therapy    Occupational Therapy Treatment Note  Name: Maghen Group MRN: [id number] DOB:   05-22-1929   Date:  09/22/2017   Admission Date: 09/16/2017 Room:  4116/4116-A   History: Pt is an 83 y.o. F adm to hosp w/dx of ARF/AKI on CKD, RUE swelling, fluid accumulation L hip, nausea and emesis, constipation, anorexia.  RUE duplex neg for brachial artery aneurysm and DVT.    Restrictions/Precautions:  Restrictions/Precautions  Restrictions/Precautions: General Precautions, Fall Risk, Up as Tolerated  Implants present? :  (bil THR, R TKR)   PIV (saline locked); pulse ox    Communication with other providers:  Cleared by RN for OT/PT cotx for precert needs.    Subjective:  Patient states: "They keep saying that every day," re: being d/c'd to SNF. "I'm glad to get out of that bed."  Pain:   Location, Type, Intensity (0/10 to 10/10):  6/10 L side of face and ear from recent shingles     Objective:    Observation: Pt seated in chair, agreable to services.  Objective Measures: none    Treatment, including education:  Therapeutic Activity Training:   Therapeutic activity training was instructed today.  Cues were given for safety, sequence, UE/LE placement, awareness, and balance.     Sit-stand, SPT, amb  Transfers  Supine to sit : n/t  Sit to supine : n/t  Scooting : sup forward/backward in chair  Rolling : n/t  Sit to stand : CGA from chair, toilet w/cues for grab bar use  Stand to sit : CGA; poor safety awareness when reaching back to chair w/hand placement or to toilet  SPT: CGA w/RW to toilet, chair w/cues for safe transfer tech  [facility]RW in [facility] note on gait.     Seated, pt requiring min A to don footies, primarily d/t toe nails needing trimmed and footies getting caught on toe nails. In standing, min A for thoroughness to brush hair.    Safety  Patient left safely in the chair, with call light/phone in reach with alarm applied. Gait belt was used for transfers and  gait.    Assessment / Impression:        Patient's tolerance of treatment:  Good  Adverse Reaction: none  Significant change in status and impact: improving mobility and act tolerance  Barriers to improvement: no  Discharge recommendations: SNF    Plan for Next Session:    Cont OT tx per POC.    Time in:  1300  Time out: 1312  Timed treatment minutes: 12  Total treatment time:  12  Electronically signed by:    HAlfredia Ferguson OT/L  09/22/2017, 12:26 PM    Previously filed values:     Short term goals  Time Frame for Short term goals: until pt discharged from hosp or goals met  Short term goal 1: [facility]  Short term goal 2: Pt will complete transfers and amb w/RW at SBA/sup in room for ADL completion.  Short term goal 3: [facility] w/use of A/E prn.  Short term goal 4: Pt will complete bil UE AROM/light strengthening at sup w/min vcues to increase act tolerance and strength.   Short term goal 5: [place] [facility]

## 2017-09-22 NOTE — Care Coordination-Inpatient (Signed)
Faxed clincals to [phone], awaiting [employer] review.  TS

## 2017-09-23 LAB — MAGNESIUM: Magnesium: 3 mg/dl — ABNORMAL HIGH (ref 1.8–2.4)

## 2017-09-23 MED ORDER — CARBAMIDE PEROXIDE 6.5 % OT SOLN
6.5 % | Freq: Two times a day (BID) | OTIC | Status: DC
Start: 2017-09-23 — End: 2017-09-26
  Administered 2017-09-23 – 2017-09-26 (×7): 5 [drp] via OTIC

## 2017-09-23 MED FILL — ISOSORBIDE DINITRATE 10 MG PO TABS: 10 mg | ORAL | Qty: 1

## 2017-09-23 MED FILL — HEPARIN SODIUM (PORCINE) 5000 UNIT/ML IJ SOLN: 5000 [IU]/mL | INTRAMUSCULAR | Qty: 1

## 2017-09-23 MED FILL — CARVEDILOL 3.125 MG PO TABS: 3.125 mg | ORAL | Qty: 1

## 2017-09-23 MED FILL — TRAMADOL HCL 50 MG PO TABS: 50 mg | ORAL | Qty: 1

## 2017-09-23 MED FILL — FERREX 150 150 MG PO CAPS: 150 mg | ORAL | Qty: 1

## 2017-09-23 MED FILL — HYDRALAZINE HCL 50 MG PO TABS: 50 mg | ORAL | Qty: 2

## 2017-09-23 MED FILL — ONDANSETRON HCL 4 MG/2ML IJ SOLN: 4 MG/2ML | INTRAMUSCULAR | Qty: 2

## 2017-09-23 MED FILL — ANASTROZOLE 1 MG PO TABS: 1 mg | ORAL | Qty: 1

## 2017-09-23 MED FILL — FLUZONE QUADRIVALENT 0.5 ML IM SUSY: 0.5 mL | INTRAMUSCULAR | Qty: 0.5

## 2017-09-23 MED FILL — DOK 100 MG PO CAPS: 100 mg | ORAL | Qty: 1

## 2017-09-23 MED FILL — PANTOPRAZOLE SODIUM 40 MG PO TBEC: 40 mg | ORAL | Qty: 1

## 2017-09-23 MED FILL — EAR DROPS EARWAX AID 6.5 % OT SOLN: 6.5 % | OTIC | Qty: 15

## 2017-09-23 NOTE — Progress Notes (Signed)
[facility] [address] [place] [address]  Phone: [phone]  Office Hours: 8:30AM - 4:30PM  Monday - Friday     No new issues  BP has been labile per chart  Would like to do manual BP checks  Recheck creat in am if she is still in the hospital

## 2017-09-23 NOTE — Plan of Care (Signed)
Problem: Falls - Risk of:  Goal: Will remain free from falls  Will remain free from falls   Outcome: Ongoing    Goal: Absence of physical injury  Absence of physical injury   Outcome: Ongoing      Problem: Pain:  Goal: Pain level will decrease  Pain level will decrease   Outcome: Ongoing    Goal: Control of acute pain  Control of acute pain   Outcome: Ongoing    Goal: Control of chronic pain  Control of chronic pain   Outcome: Ongoing      Problem: Risk for Impaired Skin Integrity  Goal: Tissue integrity - skin and mucous membranes  Structural intactness and normal physiological function of skin and  mucous membranes.   Outcome: Ongoing      Problem: Musculor/Skeletal Functional Status  Goal: Highest potential functional level  Outcome: Ongoing

## 2017-09-23 NOTE — Plan of Care (Signed)
Problem: Falls - Risk of:  Goal: Will remain free from falls  Will remain free from falls   Outcome: Ongoing    Goal: Absence of physical injury  Absence of physical injury   Outcome: Ongoing      Problem: Pain:  Goal: Pain level will decrease  Pain level will decrease   Outcome: Ongoing    Goal: Control of acute pain  Control of acute pain   Outcome: Ongoing    Goal: Control of chronic pain  Control of chronic pain   Outcome: Ongoing

## 2017-09-23 NOTE — Progress Notes (Addendum)
Progress Note (Hospitalist, Internal Medicine)  IDENTIFYING INFORMATION   PATIENT:  Judy Velasquez  MRN:  [id number]  ADMIT DATE: 09/16/2017  TIME OF EVALUATION: 09/23/2017 12:06 PM      HISTORY OF PRESENT ILLNESS   Leylany Nored is a 84 y.o. female who is a patchy historian who lives with grand dtr who is not present is a direct transfer from [place] ED after reported several days of intractable emesis, anorexia, unable to tolerate PO intake - liquids and solids. Found AKI on CKD at [facility] ED. She reports decreased appetite and unable to tolerate food but denies dysphagia. Has been constipation for days now.     She reports being first admitted to [facility] at the end of Nov for a fall and had been transitioned to rehab there before being discharged back home in the past 1-2 days. She was unable to tell me more about her hospital course at [facility]. Will need to await her grand dtr to come in the morning to discuss     SUBJECTIVE     Having stuffed ears, ear ache along the left ear    MEDICATIONS   Medications Prior to Admission  Prescriptions Prior to Admission: dorzolamide-timolol (COSOPT) 22.3-6.8 MG/ML ophthalmic solution, 1 drop 2 times daily  hydrALAZINE (APRESOLINE) 100 MG tablet, Take 100 mg by mouth 3 times daily  isosorbide dinitrate (ISORDIL) 30 MG tablet, Take 30 mg by mouth 3 times daily  NIFEdipine (ADALAT CC) 90 MG extended release tablet, Take 90 mg by mouth daily  docusate sodium (PHILLIPS STOOL SOFTENER) 100 MG capsule, Take 100 mg by mouth Twice a Week  iron polysaccharides (FERREX 150) 150 MG capsule, Take 150 mg by mouth 2 times daily  B Complex-Biotin-FA (SUPER B-50 B COMPLEX PO), Take by mouth  fluticasone (FLONASE) 50 MCG/ACT nasal spray, 1 spray by Nasal route daily  anastrozole (ARIMIDEX) 1 MG tablet, Take 1 tablet by mouth daily  furosemide (LASIX) 40 MG tablet, Take 40 mg by mouth 2 times daily   carvedilol (COREG) 6.25 MG tablet, Take 6.25 mg by mouth 2 times daily   benazepril  (LOTENSIN) 20 MG tablet, Take 20 mg by mouth daily  [DISCONTINUED] ketorolac (ACULAR) 0.5 % ophthalmic solution, 1 drop 4 times daily  [DISCONTINUED] naphazoline-pheniramine (NAPHCON-A) 0.025-0.3 % ophthalmic solution, 1 drop 4 times daily  [DISCONTINUED] diltiazem (TIAZAC) 240 MG SR capsule, Take 240 mg by mouth every evening.      Current Medications  Current Facility-Administered Medications   Medication Dose Route Frequency Provider Last Rate Last Dose   . traMADol (ULTRAM) tablet 50 mg  50 mg Oral Q6H PRN Jeris Penta, MD   50 mg at 09/23/17 0449   . hydrALAZINE (APRESOLINE) tablet 100 mg  100 mg Oral TID Regino Bellow, MD   100 mg at 09/23/17 0919   . isosorbide dinitrate (ISORDIL) tablet 30 mg  30 mg Oral TID AC Abdulmajid Adam, MD   30 mg at 09/23/17 0448   . acetaminophen (TYLENOL) suppository 650 mg  650 mg Rectal Q4H PRN Oliver Pila, MD       . influenza quadrivalent split vaccine (FLUZONE;FLUARIX;FLULAVAL;AFLURIA) injection 0.5 mL  0.5 mL Intramuscular Once Regino Bellow, MD       . sodium chloride flush 0.9 % injection 10 mL  10 mL Intravenous 2 times per day Regino Bellow, MD   10 mL at 09/23/17 0900   . sodium chloride flush 0.9 % injection 10 mL  10 mL Intravenous  PRN Regino Bellow, MD       . bisacodyl (DULCOLAX) suppository 10 mg  10 mg Rectal Once Regino Bellow, MD       . phenol 1.4 % mouth spray 1 spray  1 spray Mouth/Throat Q2H PRN Regino Bellow, MD   1 spray at 09/18/17 2043   . magic (miracle) mouthwash  5 mL Swish & Spit 4x Daily PRN Regino Bellow, MD   5 mL at 09/19/17 1032   . heparin (porcine) injection 5,000 Units  5,000 Units Subcutaneous Q8H Vernie Ammons, MD   5,000 Units at 09/23/17 0449   . docusate sodium (COLACE) capsule 100 mg  100 mg Oral BID Vernie Ammons, MD   100 mg at 09/22/17 2224   . senna (SENOKOT) tablet 17.2 mg  2 tablet Oral BID Vernie Ammons, MD   17.2 mg at 09/21/17 2017   . polyethylene glycol (GLYCOLAX) packet 17 g  17 g  Oral Daily Vernie Ammons, MD   17 g at 09/22/17 0831   . pantoprazole (PROTONIX) tablet 40 mg  40 mg Oral BID AC Vernie Ammons, MD   40 mg at 09/23/17 0449   . ondansetron (ZOFRAN) injection 4 mg  4 mg Intravenous Q6H PRN Vernie Ammons, MD   4 mg at 09/20/17 1054   . prochlorperazine (COMPAZINE) injection 10 mg  10 mg Intravenous Q6H PRN Vernie Ammons, MD   10 mg at 09/20/17 0034   . anastrozole (ARIMIDEX) tablet 1 mg  1 mg Oral Daily Regino Bellow, MD   1 mg at 09/23/17 0919   . carvedilol (COREG) tablet 3.125 mg  3.125 mg Oral BID Regino Bellow, MD   3.125 mg at 09/23/17 0919   . iron polysaccharides (NIFEREX) capsule 150 mg  150 mg Oral BID Regino Bellow, MD   150 mg at 09/23/17 5784   . acetaminophen (TYLENOL) tablet 650 mg  650 mg Oral Q4H PRN Renard Matter, MD   650 mg at 09/20/17 0034         Allergies  Allergies   Allergen Reactions   . Celebrex [Celecoxib] Shortness Of Breath, Swelling and Anaphylaxis     "Swelling Of The Esophagus"   . Fish Allergy Anaphylaxis   . Lisinopril Other (See Comments)     Dry Mouth   . Neurontin [Gabapentin] Other (See Comments)     Dizziness   . Norvasc [Amlodipine Besylate] Other (See Comments)     Edema Of Legs   . Other      "Allergic To Certain Seafoods Causing Headaches, Difficulty Breathing"   . Shellfish Allergy Anaphylaxis   . Ciprofloxacin Rash   . Lipitor Other (See Comments)     Ache   . Losartan      Per nephrologist       REVIEW OF SYSTEMS     Within above limitations. 14 point review of systems reviewed. Pertinent positive or negative as per HPI or otherwise negative per 14 point systems review.     Reviewed 09/23/2017 at 12:06 PM    PHYSICAL EXAM       Blood pressure (!) 125/59, pulse 61, temperature 97.3 F (36.3 C), temperature source Oral, resp. rate 17, height 4' 9.24" (1.454 m), weight 95 lb (43.1 kg), SpO2 99 %, not currently breastfeeding.    General - AAO x 3  Psych - Appropriate affect/speech. No agitation  Eyes - Eye lids intact. No scleral  icterus  ENT - Lips wnl.  External ear clear/dry/intact. No thyromegaly on inspection  Neuro - No gross peripheral or central neuro deficits on inspection  Heart - Sinus. RRR. S1 and S2 present.   Lung - Adequate air entry b/l, No crackles/wheezes appreciated  GI -  Soft. No guarding/rigidity. BS+  GU - No CVA/suprapubic tenderness or palpable bladder distension  Skin - Intact. No rash/petechiae/ecchymosis. Warm extremities      LABS AND IMAGING   CBC  @CBCROUNDS @    Last 3 Hemoglobin  Lab Results   Component Value Date    HGB 7.7 09/21/2017    HGB 7.1 09/20/2017    HGB 7.9 09/19/2017     Last 3 WBC/ANC  Lab Results   Component Value Date    WBC 4.8 09/21/2017    WBC 4.5 09/20/2017    WBC 5.2 09/19/2017     No components found for: GRNLOCTYABS  Last 3 Platelets  No results found for: PLATELET  Chemistry  @CHEMROUNDS @  @LYTESROUNDS @  No results found for: LDH  Coagulation Studies  Lab Results   Component Value Date    INR 1.02 09/17/2017     Liver Function Studies  Lab Results   Component Value Date    ALT 7 09/15/2017    AST 16 09/15/2017    ALKPHOS 95 09/15/2017       Recent Imaging    Krishana, Lutze #[id number] [phone]) (84 y.o. F) (Adm: 09/16/17)     [facility] [id number]         Imaging Results (last 7 days)        Procedure Component Value Ref Range Date/Time    IR ARTHR/ASP/INJ MAJOR JT/BURSA W [place] [[id number]] Collected: 09/17/17 1616    Order Status: Completed Updated: 09/17/17 1620    Narrative:     EXAMINATION:  FLUOROSCOPIC GUIDED aspiration OF THE left hip    09/17/2017 3:09 pm    HISTORY:  ORDERING SYSTEM PROVIDED HISTORY: fluid collection hip seen on CT  TECHNOLOGIST PROVIDED HISTORY:  Reason for exam:->fluid collection hip seen on CT  Ordering Physician Provided Reason for Exam: fluid collection hip seen on CT  Acuity: Unknown  Type of Exam: Unknown    FLUOROSCOPY DOSE AND TYPE OR TIME AND EXPOSURES:  0.4 minutes. 2 images    PROCEDURE:  OPERATOR: Clayborn Heron, MD    Informed consent  was obtained and universal protocol was observed.    Under standard sterile condition, local anesthesia with subcutaneous 2%  lidocaine was administered. A skin entry site was selected with fluoroscopy.  A 22 gauge spinal needle was inserted into the left hip joint under  fluoroscopic guidance. Approximately 1 ml of iodinated contrast was injected  into the joint to confirm location.    A total of 10 cc of yellow tinged fluid was aspirated.    Impression:     Successful fluoroscopic-guided left hip aspiration.    XR ARTHR/ASP/INJ INT JNT/BURSA WO [place] [[id number]] Updated: 09/17/17 1511    Order Status: Canceled     VL DUP UPPER EXTREMITY VENOUS RIGHT [[id number]] Collected: 09/16/17 0817    Order Status: Completed Updated: 09/16/17 0839    Narrative:     EXAMINATION:  VENOUS ULTRASOUND OF THE RIGHT UPPER EXTREMITY, 09/16/2017 5:19 am    TECHNIQUE:  Duplex ultrasound and Doppler images were obtained of the deep venous  structures of the upper extremity.    COMPARISON:  None.    HISTORY:  ORDERING SYSTEM PROVIDED HISTORY: check dvt  TECHNOLOGIST PROVIDED HISTORY:  Reason for  exam:->check dvt  Ordering Physician Provided Reason for Exam: Rt arm swelling  Acuity: Acute  Type of Exam: Initial    FINDINGS:  There is normal flow and compressibility of the visualized venous structures.  There is no evidence of echogenic thrombus.    Impression:     No evidence of DVT.    VL DUP UPPER EXTREMITY ARTERIES RIGHT [[id number]] Collected: 09/16/17 0823    Order Status: Completed Updated: 09/16/17 0829    Narrative:     EXAMINATION:  DUPLEX ARTERAL ULTRASOUND OF THE RIGHT UPPER EXTREMITY 09/16/2017 5:19 am    TECHNIQUE:  Duplex ultrasound and Doppler images were obtained of the lower extremity.    COMPARISON:  None.    HISTORY:  ORDERING SYSTEM PROVIDED HISTORY: check aneurysm  TECHNOLOGIST PROVIDED HISTORY:  Reason for exam:->check aneurysm  Ordering Physician Provided Reason for Exam: Lump upper arm rt. Suspect  BA  aneurysm  Acuity: Unknown  Type of Exam: Initial    FINDINGS:  Grayscale and color-flow Doppler imaging of the arteries in the right upper  extremity shows no evidence of aneurysm at the level of the brachial artery.  Diffuse mild atherosclerotic plaque with moderate atherosclerotic plaque  within the radial and ulnar arteries. No level of significant stenosis.    Flow velocities were measured as follows:        Carotid 69 cm/sec    Subclavian 73 cm/sec    Axillary 119 cm/sec    Brachial 111 cm/sec    Radial 77 cm/sec    Ulnar 79 cm/sec    Impression:     1. No evidence of brachial artery aneurysm.  2. Diffuse atherosclerotic changes in the arteries in the right upper  extremity.    VL DUP LOWER EXTREMITY ARTERIES RIGHT [[id number]]     Order Status: Canceled             Relevant labs and imaging reviewed    ASSESSMENT AND PLAN     AKI on CKD4  - renal assisting  - Cr now baseline     Erosive esophagitis - LA grade C  -PPI, odynophagia is better  - avoid NSAIDs    Left hip fluid collection -incidental finding on imaging- aspirated in IR 12/26  - Synovial fluid studies, WBC not suggestive of infection  - Ortho rec at discharge, patient to follow up with surgeon who performed total hip in 2-4 weeks     Cerumen impaction  - debrox solution    Pancreatic atrophy with multiple intraparenchymal cystic masses on CT.Differential diagnosis includes, but is not limited, to chronic pancreatitis with pseudocyst formation and IPMN (intraductal papillary mucinous neoplasm).  -CA 19-9 is high, GI d/w patient and decision to refer to [facility] for EUS and pancreas bx. Patient instructed by GI to call Dr Dellia Nims office as outpatient to arrange EUS (GI provided written instructions to patient)      Significant constipation  -12/27 - resolved with laxatives     Anemia, macrocytic   - could be related to CKD vs. MDS as differential  - f/u outpatient with oncology    Hx of DCIS right breast s/p lumpectomy in 2012  -continue  anastrazole      Underweight Body mass index is 20.62 kg/m.      Discharge awaiting pre-cert    [place], Internal Medicine  09/23/2017 at 12:06 PM

## 2017-09-24 LAB — BASIC METABOLIC PANEL
Anion Gap: 10 (ref 4–16)
BUN: 40 MG/DL — ABNORMAL HIGH (ref 6–23)
CO2: 23 MMOL/L (ref 21–32)
Calcium: 7.6 MG/DL — ABNORMAL LOW (ref 8.3–10.6)
Chloride: 102 mMol/L (ref 99–110)
Creatinine: 2.7 MG/DL — ABNORMAL HIGH (ref 0.6–1.1)
GFR African American: 20 mL/min/{1.73_m2} — ABNORMAL LOW (ref >60)
GFR Non-African American: 17 mL/min/{1.73_m2} — ABNORMAL LOW (ref >60)
Glucose: 85 MG/DL (ref 70–99)
Potassium: 4.5 MMOL/L (ref 3.5–5.1)
Sodium: 135 MMOL/L (ref 135–145)

## 2017-09-24 LAB — RETICULOCYTES: Retic Ct Pct: 1.9 % (ref 0.2–2.20)

## 2017-09-24 LAB — MAGNESIUM: Magnesium: 2.8 mg/dl — ABNORMAL HIGH (ref 1.8–2.4)

## 2017-09-24 MED ORDER — ISOSORBIDE DINITRATE 30 MG PO TABS
30 MG | ORAL_TABLET | Freq: Three times a day (TID) | ORAL | 3 refills | Status: DC
Start: 2017-09-24 — End: 2017-09-26

## 2017-09-24 MED ORDER — NIFEDIPINE ER 30 MG PO TB24
30 MG | ORAL_TABLET | Freq: Two times a day (BID) | ORAL | 3 refills | Status: DC
Start: 2017-09-24 — End: 2017-09-26

## 2017-09-24 MED ORDER — NIFEDIPINE ER OSMOTIC RELEASE 30 MG PO TB24
30 MG | Freq: Two times a day (BID) | ORAL | Status: DC
Start: 2017-09-24 — End: 2017-09-26
  Administered 2017-09-24 – 2017-09-26 (×5): 30 mg via ORAL

## 2017-09-24 MED ORDER — PANTOPRAZOLE SODIUM 40 MG PO TBEC
40 MG | ORAL_TABLET | Freq: Two times a day (BID) | ORAL | 3 refills | Status: DC
Start: 2017-09-24 — End: 2017-09-26

## 2017-09-24 MED ORDER — CARBAMIDE PEROXIDE 6.5 % OT SOLN
6.5 % | Freq: Two times a day (BID) | OTIC | 0 refills | Status: DC
Start: 2017-09-24 — End: 2017-09-26

## 2017-09-24 MED ORDER — HYDRALAZINE HCL 100 MG PO TABS
100 MG | ORAL_TABLET | Freq: Three times a day (TID) | ORAL | 3 refills | Status: DC
Start: 2017-09-24 — End: 2017-09-26

## 2017-09-24 MED ORDER — CARVEDILOL 3.125 MG PO TABS
3.125 MG | ORAL_TABLET | Freq: Two times a day (BID) | ORAL | 1 refills | Status: DC
Start: 2017-09-24 — End: 2017-09-26

## 2017-09-24 MED FILL — HYDRALAZINE HCL 50 MG PO TABS: 50 mg | ORAL | Qty: 2

## 2017-09-24 MED FILL — ISOSORBIDE DINITRATE 10 MG PO TABS: 10 mg | ORAL | Qty: 1

## 2017-09-24 MED FILL — FERREX 150 150 MG PO CAPS: 150 mg | ORAL | Qty: 1

## 2017-09-24 MED FILL — CARVEDILOL 3.125 MG PO TABS: 3.125 mg | ORAL | Qty: 1

## 2017-09-24 MED FILL — DOK 100 MG PO CAPS: 100 mg | ORAL | Qty: 1

## 2017-09-24 MED FILL — ANASTROZOLE 1 MG PO TABS: 1 mg | ORAL | Qty: 1

## 2017-09-24 MED FILL — PANTOPRAZOLE SODIUM 40 MG PO TBEC: 40 mg | ORAL | Qty: 1

## 2017-09-24 MED FILL — NIFEDICAL XL 30 MG PO TB24: 30 mg | ORAL | Qty: 1

## 2017-09-24 MED FILL — HEPARIN SODIUM (PORCINE) 5000 UNIT/ML IJ SOLN: 5000 [IU]/mL | INTRAMUSCULAR | Qty: 1

## 2017-09-24 MED FILL — SENNA-LAX 8.6 MG PO TABS: 8.6 mg | ORAL | Qty: 2

## 2017-09-24 MED FILL — TRAMADOL HCL 50 MG PO TABS: 50 mg | ORAL | Qty: 1

## 2017-09-24 MED FILL — ONDANSETRON HCL 4 MG/2ML IJ SOLN: 4 MG/2ML | INTRAMUSCULAR | Qty: 2

## 2017-09-24 NOTE — Progress Notes (Signed)
Occupational Therapy  Occupational Therapy Treatment Note  Date:  09/24/2017   Room:  4116/4116-A    Judy Velasquez DOB:   03-02-1929   MRN: [id number] Admission Date: 09/16/2017     Diagnosis:  There were no encounter diagnoses.    Restrictions/Precautions:    Restrictions/Precautions  Restrictions/Precautions: General Precautions, Fall Risk, Up as Tolerated  Implants present? :  (bil THR, R TKR)   Position Activity Restriction  Other position/activity restrictions: IV; foley, pulse ox. RN, Trish, cleared pt for OT and was w/pt on OTs arrival, managing her hygiene s/p BM and assisting from [facility] back to chair.              Communication with other providers:     Subjective:  Patient states:  Pleasant   Pain:   Location, Type, Intensity (0/10 to 10/10):  L side of face    Objective:    Orientation: WFL  Observation: Pt received in chair. Alert and cooperative. RUE edematous.   Objective Measures:  n/a    Treatment, including education:    Self Care Training:   Self care training was performed today.  Cues were given for safety, sequence, UE/LE placement, visual cues, and balance.    Activities performed today included dressing, toileting, hand hygiene, and grooming.    Pt walked to bathroom c CGA using RW.   CGA to sit onto toilet.   Urinated and completed seated higyiene c supervision).  Stood from commode c CGA using grab bar.  ModA for LB dressing (threading both feet and hiking back of brief over buttocks).   Stood sinkside 90 seconds c CGA for oral and hand hygiene.   Pt increasingly fatigued following grooming. Required MinA to walk back to chair c RW  CGA to sit into chair c cues to reach back.     Pt completed x10 reps of seated UE ex: arm raises, punches, wrist flexion and extension, finger flexion.        Assessment / Impression:      Patient's tolerance of treatment:  fair   Adverse Reaction: none  Significant change in status and impact:  Reduced activity tolerance vs last OT session  Barriers to improvement:  none  Recommendations: SNF. Is at risk for falls and further debility therefore not safe to return directly to home.   Plan for Next Session:    Cont POC addressing goals:      Safety: Left in chair with all needs in reach. Gait belt used for transfer and mobility.      Time in:  1430  Time out:  1445  Timed treatment minutes:  15  Total treatment time:  15    Electronically signed by:    [address], OTR/L, [place]   [id number]   2:56 PM, 09/24/2017      Previously filed values:     Short term goals  Time Frame for Short term goals: until pt discharged from hosp or goals met  Short term goal 1: Pt will complete bed mobility at [facility].  Short term goal 2: Pt will complete transfers and amb w/RW at [facility]/sup in room for ADL completion.  Short term goal 3: Pt will complete sp bath at [facility] w/use of A/E prn.  Short term goal 4: Pt will complete bil UE AROM/light strengthening at sup w/min vcues to increase act tolerance and strength.   Short term goal 5: Pt will complete toileting at [place].

## 2017-09-24 NOTE — Care Coordination-Inpatient (Addendum)
CM called [facility] and left VM to discuss precert pending for Swing bed. Pt has been approved and awaiting insurance. LH  0900 CM received call from [place] and pt has been denied for Swing bed.   CM called left VM for Amy at [facility] which was her 2nd option. CM will await discussion. LH  CM spoke with Amy at [facility]. Pt will have to be precerted for SNF placement. CM updated white board and requested additional  PT/OT notes [place]  1455 CM received call from Amy at [facility] that she needs a face sheet. CM faxed the information as she requested. LH  Precert remains pending for [place]. LH  CM collaborated with Dr Ronny Flurry regarding pt wanting to go to a SNF. If pt is declined she does live with grd dtr and has 24/7 care. Pt would like home care if denied and would like [place]  CM please follow up with [facility] and if denied set up home care. [place]

## 2017-09-24 NOTE — Progress Notes (Signed)
[facility] [address] [place] [address]  Phone: [phone]  Office Hours: 8:30AM - 4:30PM  Monday - Friday     BP has been up  Will restart the procardia  Creat 2.7  K normal  Will follow

## 2017-09-24 NOTE — Plan of Care (Signed)
Problem: Falls - Risk of:  Goal: Will remain free from falls  Will remain free from falls   Outcome: Ongoing    Goal: Absence of physical injury  Absence of physical injury   Outcome: Ongoing      Problem: Pain:  Goal: Pain level will decrease  Pain level will decrease   Outcome: Ongoing    Goal: Control of acute pain  Control of acute pain   Outcome: Ongoing    Goal: Control of chronic pain  Control of chronic pain   Outcome: Ongoing      Problem: Risk for Impaired Skin Integrity  Goal: Tissue integrity - skin and mucous membranes  Structural intactness and normal physiological function of skin and  mucous membranes.   Outcome: Ongoing      Problem: Musculor/Skeletal Functional Status  Goal: Highest potential functional level  Outcome: Ongoing

## 2017-09-24 NOTE — Progress Notes (Signed)
Progress Note (Hospitalist, Internal Medicine)  IDENTIFYING INFORMATION   PATIENT:  Judy Velasquez  MRN:  [id number]  ADMIT DATE: 09/16/2017  TIME OF EVALUATION: 09/24/2017 4:03 PM      HISTORY OF PRESENT ILLNESS   Judy Velasquez is a 84 y.o. female who is a patchy historian who lives with grand dtr who is not present is a direct transfer from [place] ED after reported several days of intractable emesis, anorexia, unable to tolerate PO intake - liquids and solids. Found AKI on CKD at [facility] ED. She reports decreased appetite and unable to tolerate food but denies dysphagia. Has been constipation for days now.     She reports being first admitted to [facility] at the end of Nov for a fall and had been transitioned to rehab there before being discharged back home in the past 1-2 days. She was unable to tell me more about her hospital course at [facility]. Will need to await her grand dtr to come in the morning to discuss     SUBJECTIVE     Doing fair. Feels weak. Wishes to try to get to rehab if possible     MEDICATIONS   Medications Prior to Admission  Prescriptions Prior to Admission: dorzolamide-timolol (COSOPT) 22.3-6.8 MG/ML ophthalmic solution, 1 drop 2 times daily  docusate sodium (PHILLIPS STOOL SOFTENER) 100 MG capsule, Take 100 mg by mouth Twice a Week  iron polysaccharides (FERREX 150) 150 MG capsule, Take 150 mg by mouth 2 times daily  B Complex-Biotin-FA (SUPER B-50 B COMPLEX PO), Take by mouth  fluticasone (FLONASE) 50 MCG/ACT nasal spray, 1 spray by Nasal route daily  anastrozole (ARIMIDEX) 1 MG tablet, Take 1 tablet by mouth daily  [DISCONTINUED] benazepril (LOTENSIN) 20 MG tablet, Take 20 mg by mouth daily  [DISCONTINUED] ketorolac (ACULAR) 0.5 % ophthalmic solution, 1 drop 4 times daily  [DISCONTINUED] hydrALAZINE (APRESOLINE) 100 MG tablet, Take 100 mg by mouth 3 times daily  [DISCONTINUED] isosorbide dinitrate (ISORDIL) 30 MG tablet, Take 30 mg by mouth 3 times daily  [DISCONTINUED] NIFEdipine  (ADALAT CC) 90 MG extended release tablet, Take 90 mg by mouth daily  [DISCONTINUED] naphazoline-pheniramine (NAPHCON-A) 0.025-0.3 % ophthalmic solution, 1 drop 4 times daily  [DISCONTINUED] diltiazem (TIAZAC) 240 MG SR capsule, Take 240 mg by mouth every evening.    [DISCONTINUED] furosemide (LASIX) 40 MG tablet, Take 40 mg by mouth 2 times daily   [DISCONTINUED] carvedilol (COREG) 6.25 MG tablet, Take 6.25 mg by mouth 2 times daily     Current Medications  Current Facility-Administered Medications   Medication Dose Route Frequency Provider Last Rate Last Dose   . NIFEdipine (PROCARDIA XL) extended release tablet 30 mg  30 mg Oral BID Claretta Fraise, MD   30 mg at 09/24/17 0925   . carbamide peroxide (DEBROX) 6.5 % otic solution 5 drop  5 drop Both Ears BID Vernie Ammons, MD   5 drop at 09/24/17 0924   . traMADol (ULTRAM) tablet 50 mg  50 mg Oral Q6H PRN Jeris Penta, MD   50 mg at 09/24/17 0957   . hydrALAZINE (APRESOLINE) tablet 100 mg  100 mg Oral TID Regino Bellow, MD   100 mg at 09/24/17 1334   . isosorbide dinitrate (ISORDIL) tablet 30 mg  30 mg Oral TID AC Abdulmajid Adam, MD   30 mg at 09/24/17 1220   . acetaminophen (TYLENOL) suppository 650 mg  650 mg Rectal Q4H PRN Oliver Pila, MD       . influenza  quadrivalent split vaccine (FLUZONE;FLUARIX;FLULAVAL;AFLURIA) injection 0.5 mL  0.5 mL Intramuscular Once Regino Bellow, MD       . sodium chloride flush 0.9 % injection 10 mL  10 mL Intravenous 2 times per day Regino Bellow, MD   10 mL at 09/24/17 0924   . sodium chloride flush 0.9 % injection 10 mL  10 mL Intravenous PRN Regino Bellow, MD   10 mL at 09/23/17 2030   . bisacodyl (DULCOLAX) suppository 10 mg  10 mg Rectal Once Regino Bellow, MD       . phenol 1.4 % mouth spray 1 spray  1 spray Mouth/Throat Q2H PRN Regino Bellow, MD   1 spray at 09/18/17 2043   . magic (miracle) mouthwash  5 mL Swish & Spit 4x Daily PRN Regino Bellow, MD   5 mL at 09/19/17 1032   . heparin  (porcine) injection 5,000 Units  5,000 Units Subcutaneous Q8H Vernie Ammons, MD   5,000 Units at 09/24/17 1334   . docusate sodium (COLACE) capsule 100 mg  100 mg Oral BID Vernie Ammons, MD   100 mg at 09/24/17 0923   . senna (SENOKOT) tablet 17.2 mg  2 tablet Oral BID Vernie Ammons, MD   17.2 mg at 09/23/17 2030   . polyethylene glycol (GLYCOLAX) packet 17 g  17 g Oral Daily Vernie Ammons, MD   17 g at 09/22/17 0831   . pantoprazole (PROTONIX) tablet 40 mg  40 mg Oral BID AC Vernie Ammons, MD   40 mg at 09/24/17 0531   . ondansetron (ZOFRAN) injection 4 mg  4 mg Intravenous Q6H PRN Vernie Ammons, MD   4 mg at 09/24/17 1334   . prochlorperazine (COMPAZINE) injection 10 mg  10 mg Intravenous Q6H PRN Vernie Ammons, MD   10 mg at 09/20/17 0034   . anastrozole (ARIMIDEX) tablet 1 mg  1 mg Oral Daily Regino Bellow, MD   1 mg at 09/24/17 0924   . carvedilol (COREG) tablet 3.125 mg  3.125 mg Oral BID Regino Bellow, MD   3.125 mg at 09/24/17 6295   . iron polysaccharides (NIFEREX) capsule 150 mg  150 mg Oral BID Regino Bellow, MD   150 mg at 09/24/17 2841   . acetaminophen (TYLENOL) tablet 650 mg  650 mg Oral Q4H PRN Renard Matter, MD   650 mg at 09/20/17 0034         Allergies  Allergies   Allergen Reactions   . Celebrex [Celecoxib] Shortness Of Breath, Swelling and Anaphylaxis     "Swelling Of The Esophagus"   . Fish Allergy Anaphylaxis   . Lisinopril Other (See Comments)     Dry Mouth   . Neurontin [Gabapentin] Other (See Comments)     Dizziness   . Norvasc [Amlodipine Besylate] Other (See Comments)     Edema Of Legs   . Other      "Allergic To Certain Seafoods Causing Headaches, Difficulty Breathing"   . Shellfish Allergy Anaphylaxis   . Ciprofloxacin Rash   . Lipitor Other (See Comments)     Ache   . Losartan      Per nephrologist       REVIEW OF SYSTEMS     Within above limitations. 14 point review of systems reviewed. Pertinent positive or negative as per HPI or otherwise negative per 14 point systems review.      Reviewed 09/24/2017 at 4:03 PM    PHYSICAL EXAM  Blood pressure (!) 177/72, pulse 58, temperature 97.7 F (36.5 C), temperature source Oral, resp. rate 17, height 4' 9.24" (1.454 m), weight 95 lb (43.1 kg), SpO2 97 %, not currently breastfeeding.    General - AAO x 3  Psych - Appropriate affect/speech. No agitation  Eyes - Eye lids intact. No scleral icterus  ENT - Lips wnl. External ear clear/dry/intact. No thyromegaly on inspection  Neuro - No gross peripheral or central neuro deficits on inspection  Heart - Sinus. RRR. S1 and S2 present.   Lung - Adequate air entry b/l, No crackles/wheezes appreciated  GI -  Soft. No guarding/rigidity. BS+  GU - No CVA/suprapubic tenderness or palpable bladder distension  Skin - Intact. No rash/petechiae/ecchymosis. Warm extremities      LABS AND IMAGING   CBC  @CBCROUNDS @    Last 3 Hemoglobin  Lab Results   Component Value Date    HGB 7.7 09/21/2017    HGB 7.1 09/20/2017    HGB 7.9 09/19/2017     Last 3 WBC/ANC  Lab Results   Component Value Date    WBC 4.8 09/21/2017    WBC 4.5 09/20/2017    WBC 5.2 09/19/2017     No components found for: GRNLOCTYABS  Last 3 Platelets  No results found for: PLATELET  Chemistry  @CHEMROUNDS @  @LYTESROUNDS @  No results found for: LDH  Coagulation Studies  Lab Results   Component Value Date    INR 1.02 09/17/2017     Liver Function Studies  Lab Results   Component Value Date    ALT 7 09/15/2017    AST 16 09/15/2017    ALKPHOS 95 09/15/2017       Recent Imaging    [place], Kreutzer #[id number] [phone]) (84 y.o. F) (Adm: 09/16/17)     [facility] [id number]         Imaging Results (last 7 days)        Procedure Component Value Ref Range Date/Time    IR ARTHR/ASP/INJ MAJOR JT/BURSA W [place] [[id number]] Collected: 09/17/17 1616    Order Status: Completed Updated: 09/17/17 1620    Narrative:     EXAMINATION:  FLUOROSCOPIC GUIDED aspiration OF THE left hip    09/17/2017 3:09 pm    HISTORY:  ORDERING SYSTEM PROVIDED HISTORY: fluid collection  hip seen on CT  TECHNOLOGIST PROVIDED HISTORY:  Reason for exam:->fluid collection hip seen on CT  Ordering Physician Provided Reason for Exam: fluid collection hip seen on CT  Acuity: Unknown  Type of Exam: Unknown    FLUOROSCOPY DOSE AND TYPE OR TIME AND EXPOSURES:  0.4 minutes. 2 images    PROCEDURE:  OPERATOR: Clayborn Heron, MD    Informed consent was obtained and universal protocol was observed.    Under standard sterile condition, local anesthesia with subcutaneous 2%  lidocaine was administered. A skin entry site was selected with fluoroscopy.  A 22 gauge spinal needle was inserted into the left hip joint under  fluoroscopic guidance. Approximately 1 ml of iodinated contrast was injected  into the joint to confirm location.    A total of 10 cc of yellow tinged fluid was aspirated.    Impression:     Successful fluoroscopic-guided left hip aspiration.    XR ARTHR/ASP/INJ INT JNT/BURSA WO [place] [[id number]] Updated: 09/17/17 1511    Order Status: Canceled     VL DUP UPPER EXTREMITY VENOUS RIGHT [[id number]] Collected: 09/16/17 0817    Order Status: Completed Updated: 09/16/17 0839    Narrative:  EXAMINATION:  VENOUS ULTRASOUND OF THE RIGHT UPPER EXTREMITY, 09/16/2017 5:19 am    TECHNIQUE:  Duplex ultrasound and Doppler images were obtained of the deep venous  structures of the upper extremity.    COMPARISON:  None.    HISTORY:  ORDERING SYSTEM PROVIDED HISTORY: check dvt  TECHNOLOGIST PROVIDED HISTORY:  Reason for exam:->check dvt  Ordering Physician Provided Reason for Exam: Rt arm swelling  Acuity: Acute  Type of Exam: Initial    FINDINGS:  There is normal flow and compressibility of the visualized venous structures.  There is no evidence of echogenic thrombus.    Impression:     No evidence of DVT.    VL DUP UPPER EXTREMITY ARTERIES RIGHT [[id number]] Collected: 09/16/17 0823    Order Status: Completed Updated: 09/16/17 0829    Narrative:     EXAMINATION:  DUPLEX ARTERAL ULTRASOUND OF  THE RIGHT UPPER EXTREMITY 09/16/2017 5:19 am    TECHNIQUE:  Duplex ultrasound and Doppler images were obtained of the lower extremity.    COMPARISON:  None.    HISTORY:  ORDERING SYSTEM PROVIDED HISTORY: check aneurysm  TECHNOLOGIST PROVIDED HISTORY:  Reason for exam:->check aneurysm  Ordering Physician Provided Reason for Exam: Lump upper arm rt. Suspect BA  aneurysm  Acuity: Unknown  Type of Exam: Initial    FINDINGS:  Grayscale and color-flow Doppler imaging of the arteries in the right upper  extremity shows no evidence of aneurysm at the level of the brachial artery.  Diffuse mild atherosclerotic plaque with moderate atherosclerotic plaque  within the radial and ulnar arteries. No level of significant stenosis.    Flow velocities were measured as follows:        Carotid 69 cm/sec    Subclavian 73 cm/sec    Axillary 119 cm/sec    Brachial 111 cm/sec    Radial 77 cm/sec    Ulnar 79 cm/sec    Impression:     1. No evidence of brachial artery aneurysm.  2. Diffuse atherosclerotic changes in the arteries in the right upper  extremity.    VL DUP LOWER EXTREMITY ARTERIES RIGHT [[id number]]     Order Status: Canceled             Relevant labs and imaging reviewed    ASSESSMENT AND PLAN     AKI on CKD4  - renal assisting  - Cr now baseline     Erosive esophagitis - LA grade C  -PPI, odynophagia is better  - avoid NSAIDs    Left hip fluid collection -incidental finding on imaging- aspirated in IR 12/26  - Synovial fluid studies, WBC not suggestive of infection  - Ortho rec at discharge, patient to follow up with surgeon who performed total hip in 2-4 weeks     Cerumen impaction  - debrox solution    Pancreatic atrophy with multiple intraparenchymal cystic masses on CT.Differential diagnosis includes, but is not limited, to chronic pancreatitis with pseudocyst formation and IPMN (intraductal papillary mucinous neoplasm).  -CA 19-9 is high, GI d/w patient and decision to refer to [facility] for EUS and pancreas bx.  Patient instructed by GI to call Dr Dellia Nims office as outpatient to arrange EUS (GI provided written instructions to patient)      Significant constipation  -12/27 - resolved with laxatives     Anemia, macrocytic   - could be related to CKD vs. MDS as differential  - f/u outpatient with hematology    Hx of DCIS right breast s/p lumpectomy in  2012  -continue anastrazole    Underweight Body mass index is 20.62 kg/m    Discharge awaiting pre-cert. D/w case management - pending [place] pre-cert      [place], Internal Medicine  09/24/2017 at 4:03 PM

## 2017-09-25 LAB — ERYTHROPOIETIN: Erythropoietin: 9

## 2017-09-25 LAB — MAGNESIUM: Magnesium: 2.8 mg/dl — ABNORMAL HIGH (ref 1.8–2.4)

## 2017-09-25 MED FILL — HYDRALAZINE HCL 50 MG PO TABS: 50 mg | ORAL | Qty: 2

## 2017-09-25 MED FILL — ISOSORBIDE DINITRATE 10 MG PO TABS: 10 mg | ORAL | Qty: 1

## 2017-09-25 MED FILL — HEPARIN SODIUM (PORCINE) 5000 UNIT/ML IJ SOLN: 5000 [IU]/mL | INTRAMUSCULAR | Qty: 1

## 2017-09-25 MED FILL — POLYETHYLENE GLYCOL 3350 17 G PO PACK: 17 g | ORAL | Qty: 1

## 2017-09-25 MED FILL — DOK 100 MG PO CAPS: 100 mg | ORAL | Qty: 1

## 2017-09-25 MED FILL — CARVEDILOL 3.125 MG PO TABS: 3.125 mg | ORAL | Qty: 1

## 2017-09-25 MED FILL — ONDANSETRON HCL 4 MG/2ML IJ SOLN: 4 MG/2ML | INTRAMUSCULAR | Qty: 2

## 2017-09-25 MED FILL — PANTOPRAZOLE SODIUM 40 MG PO TBEC: 40 mg | ORAL | Qty: 1

## 2017-09-25 MED FILL — FLUZONE QUADRIVALENT 0.5 ML IM SUSY: 0.5 mL | INTRAMUSCULAR | Qty: 0.5

## 2017-09-25 MED FILL — TRAMADOL HCL 50 MG PO TABS: 50 mg | ORAL | Qty: 1

## 2017-09-25 MED FILL — SENNA-LAX 8.6 MG PO TABS: 8.6 mg | ORAL | Qty: 2

## 2017-09-25 MED FILL — FERREX 150 150 MG PO CAPS: 150 mg | ORAL | Qty: 1

## 2017-09-25 MED FILL — NIFEDICAL XL 30 MG PO TB24: 30 mg | ORAL | Qty: 1

## 2017-09-25 MED FILL — ANASTROZOLE 1 MG PO TABS: 1 mg | ORAL | Qty: 1

## 2017-09-25 NOTE — Progress Notes (Addendum)
Progress Note (Hospitalist, Internal Medicine)  IDENTIFYING INFORMATION   PATIENT:  Judy Velasquez  MRN:  [id number]  ADMIT DATE: 09/16/2017  TIME OF EVALUATION: 09/25/2017 3:11 PM      HISTORY OF PRESENT ILLNESS   Judy Velasquez is a 84 y.o. female who is a patchy historian who lives with grand dtr who is not present is a direct transfer from [place] ED after reported several days of intractable emesis, anorexia, unable to tolerate PO intake - liquids and solids. Found AKI on CKD at [facility] ED. She reports decreased appetite and unable to tolerate food but denies dysphagia. Has been constipation for days now.     She reports being first admitted to [facility] at the end of Nov for a fall and had been transitioned to rehab there before being discharged back home in the past 1-2 days. She was unable to tell me more about her hospital course at [facility]. Will need to await her grand dtr to come in the morning to discuss     SUBJECTIVE     Continues to wishes to go to rehab due to weakness that is not improved     MEDICATIONS   Medications Prior to Admission  Prescriptions Prior to Admission: dorzolamide-timolol (COSOPT) 22.3-6.8 MG/ML ophthalmic solution, 1 drop 2 times daily  docusate sodium (PHILLIPS STOOL SOFTENER) 100 MG capsule, Take 100 mg by mouth Twice a Week  iron polysaccharides (FERREX 150) 150 MG capsule, Take 150 mg by mouth 2 times daily  B Complex-Biotin-FA (SUPER B-50 B COMPLEX PO), Take by mouth  fluticasone (FLONASE) 50 MCG/ACT nasal spray, 1 spray by Nasal route daily  anastrozole (ARIMIDEX) 1 MG tablet, Take 1 tablet by mouth daily  [DISCONTINUED] benazepril (LOTENSIN) 20 MG tablet, Take 20 mg by mouth daily  [DISCONTINUED] ketorolac (ACULAR) 0.5 % ophthalmic solution, 1 drop 4 times daily  [DISCONTINUED] hydrALAZINE (APRESOLINE) 100 MG tablet, Take 100 mg by mouth 3 times daily  [DISCONTINUED] isosorbide dinitrate (ISORDIL) 30 MG tablet, Take 30 mg by mouth 3 times daily  [DISCONTINUED]  NIFEdipine (ADALAT CC) 90 MG extended release tablet, Take 90 mg by mouth daily  [DISCONTINUED] naphazoline-pheniramine (NAPHCON-A) 0.025-0.3 % ophthalmic solution, 1 drop 4 times daily  [DISCONTINUED] diltiazem (TIAZAC) 240 MG SR capsule, Take 240 mg by mouth every evening.    [DISCONTINUED] furosemide (LASIX) 40 MG tablet, Take 40 mg by mouth 2 times daily   [DISCONTINUED] carvedilol (COREG) 6.25 MG tablet, Take 6.25 mg by mouth 2 times daily     Current Medications  Current Facility-Administered Medications   Medication Dose Route Frequency Provider Last Rate Last Dose   . NIFEdipine (PROCARDIA XL) extended release tablet 30 mg  30 mg Oral BID Claretta Fraise, MD   30 mg at 09/25/17 0936   . carbamide peroxide (DEBROX) 6.5 % otic solution 5 drop  5 drop Both Ears BID Vernie Ammons, MD   5 drop at 09/25/17 0939   . traMADol (ULTRAM) tablet 50 mg  50 mg Oral Q6H PRN Jeris Penta, MD   50 mg at 09/25/17 0540   . hydrALAZINE (APRESOLINE) tablet 100 mg  100 mg Oral TID Regino Bellow, MD   100 mg at 09/25/17 0936   . isosorbide dinitrate (ISORDIL) tablet 30 mg  30 mg Oral TID AC Abdulmajid Adam, MD   30 mg at 09/25/17 0540   . acetaminophen (TYLENOL) suppository 650 mg  650 mg Rectal Q4H PRN Oliver Pila, MD       .  influenza quadrivalent split vaccine (FLUZONE;FLUARIX;FLULAVAL;AFLURIA) injection 0.5 mL  0.5 mL Intramuscular Once Regino Bellow, MD       . sodium chloride flush 0.9 % injection 10 mL  10 mL Intravenous 2 times per day Regino Bellow, MD   10 mL at 09/25/17 8119   . sodium chloride flush 0.9 % injection 10 mL  10 mL Intravenous PRN Regino Bellow, MD   10 mL at 09/23/17 2030   . bisacodyl (DULCOLAX) suppository 10 mg  10 mg Rectal Once Regino Bellow, MD       . phenol 1.4 % mouth spray 1 spray  1 spray Mouth/Throat Q2H PRN Regino Bellow, MD   1 spray at 09/18/17 2043   . magic (miracle) mouthwash  5 mL Swish & Spit 4x Daily PRN Regino Bellow, MD   5 mL at 09/19/17 1032    . heparin (porcine) injection 5,000 Units  5,000 Units Subcutaneous Q8H Vernie Ammons, MD   5,000 Units at 09/25/17 0540   . docusate sodium (COLACE) capsule 100 mg  100 mg Oral BID Vernie Ammons, MD   100 mg at 09/25/17 0936   . senna (SENOKOT) tablet 17.2 mg  2 tablet Oral BID Vernie Ammons, MD   17.2 mg at 09/24/17 2259   . polyethylene glycol (GLYCOLAX) packet 17 g  17 g Oral Daily Vernie Ammons, MD   17 g at 09/25/17 0936   . pantoprazole (PROTONIX) tablet 40 mg  40 mg Oral BID AC Vernie Ammons, MD   40 mg at 09/25/17 0540   . ondansetron (ZOFRAN) injection 4 mg  4 mg Intravenous Q6H PRN Vernie Ammons, MD   4 mg at 09/25/17 0256   . prochlorperazine (COMPAZINE) injection 10 mg  10 mg Intravenous Q6H PRN Vernie Ammons, MD   10 mg at 09/20/17 0034   . anastrozole (ARIMIDEX) tablet 1 mg  1 mg Oral Daily Regino Bellow, MD   1 mg at 09/25/17 1478   . carvedilol (COREG) tablet 3.125 mg  3.125 mg Oral BID Regino Bellow, MD   3.125 mg at 09/25/17 2956   . iron polysaccharides (NIFEREX) capsule 150 mg  150 mg Oral BID Regino Bellow, MD   150 mg at 09/25/17 2130   . acetaminophen (TYLENOL) tablet 650 mg  650 mg Oral Q4H PRN Renard Matter, MD   650 mg at 09/20/17 0034         Allergies  Allergies   Allergen Reactions   . Celebrex [Celecoxib] Shortness Of Breath, Swelling and Anaphylaxis     "Swelling Of The Esophagus"   . Fish Allergy Anaphylaxis   . Lisinopril Other (See Comments)     Dry Mouth   . Neurontin [Gabapentin] Other (See Comments)     Dizziness   . Norvasc [Amlodipine Besylate] Other (See Comments)     Edema Of Legs   . Other      "Allergic To Certain Seafoods Causing Headaches, Difficulty Breathing"   . Shellfish Allergy Anaphylaxis   . Ciprofloxacin Rash   . Lipitor Other (See Comments)     Ache   . Losartan      Per nephrologist       REVIEW OF SYSTEMS     Within above limitations. 14 point review of systems reviewed. Pertinent positive or negative as per HPI or otherwise negative per 14 point systems  review.     Reviewed 09/25/2017 at 3:11 PM    PHYSICAL  EXAM       Blood pressure 135/63, pulse 59, temperature 97.7 F (36.5 C), temperature source Oral, resp. rate 16, height 4' 9.24" (1.454 m), weight 99 lb (44.9 kg), SpO2 99 %, not currently breastfeeding.    General - AAO x 3  Psych - Appropriate affect/speech. No agitation  Eyes - Eye lids intact. No scleral icterus  ENT - Lips wnl. External ear clear/dry/intact. No thyromegaly on inspection  Neuro - No gross peripheral or central neuro deficits on inspection  Heart - Sinus. RRR. S1 and S2 present.   Lung - Adequate air entry b/l, No crackles/wheezes appreciated  GI -  Soft. No guarding/rigidity. BS+  GU - No CVA/suprapubic tenderness or palpable bladder distension  Skin - Intact. No rash/petechiae/ecchymosis. Warm extremities      LABS AND IMAGING   CBC  @CBCROUNDS @    Last 3 Hemoglobin  Lab Results   Component Value Date    HGB 7.7 09/21/2017    HGB 7.1 09/20/2017    HGB 7.9 09/19/2017     Last 3 WBC/ANC  Lab Results   Component Value Date    WBC 4.8 09/21/2017    WBC 4.5 09/20/2017    WBC 5.2 09/19/2017     No components found for: GRNLOCTYABS  Last 3 Platelets  No results found for: PLATELET  Chemistry  @CHEMROUNDS @  @LYTESROUNDS @  No results found for: LDH  Coagulation Studies  Lab Results   Component Value Date    INR 1.02 09/17/2017     Liver Function Studies  Lab Results   Component Value Date    ALT 7 09/15/2017    AST 16 09/15/2017    ALKPHOS 95 09/15/2017       Recent Imaging    Willodean, Leven #[id number] [phone]) (84 y.o. F) (Adm: 09/16/17)     [facility] [id number]         Imaging Results (last 7 days)        Procedure Component Value Ref Range Date/Time    IR ARTHR/ASP/INJ MAJOR JT/BURSA W [place] [[id number]] Collected: 09/17/17 1616    Order Status: Completed Updated: 09/17/17 1620    Narrative:     EXAMINATION:  FLUOROSCOPIC GUIDED aspiration OF THE left hip    09/17/2017 3:09 pm    HISTORY:  ORDERING SYSTEM PROVIDED HISTORY: fluid  collection hip seen on CT  TECHNOLOGIST PROVIDED HISTORY:  Reason for exam:->fluid collection hip seen on CT  Ordering Physician Provided Reason for Exam: fluid collection hip seen on CT  Acuity: Unknown  Type of Exam: Unknown    FLUOROSCOPY DOSE AND TYPE OR TIME AND EXPOSURES:  0.4 minutes. 2 images    PROCEDURE:  OPERATOR: Clayborn Heron, MD    Informed consent was obtained and universal protocol was observed.    Under standard sterile condition, local anesthesia with subcutaneous 2%  lidocaine was administered. A skin entry site was selected with fluoroscopy.  A 22 gauge spinal needle was inserted into the left hip joint under  fluoroscopic guidance. Approximately 1 ml of iodinated contrast was injected  into the joint to confirm location.    A total of 10 cc of yellow tinged fluid was aspirated.    Impression:     Successful fluoroscopic-guided left hip aspiration.    XR ARTHR/ASP/INJ INT JNT/BURSA WO [place] [[id number]] Updated: 09/17/17 1511    Order Status: Canceled     VL DUP UPPER EXTREMITY VENOUS RIGHT [[id number]] Collected: 09/16/17 0817    Order Status: Completed Updated:  09/16/17 0839    Narrative:     EXAMINATION:  VENOUS ULTRASOUND OF THE RIGHT UPPER EXTREMITY, 09/16/2017 5:19 am    TECHNIQUE:  Duplex ultrasound and Doppler images were obtained of the deep venous  structures of the upper extremity.    COMPARISON:  None.    HISTORY:  ORDERING SYSTEM PROVIDED HISTORY: check dvt  TECHNOLOGIST PROVIDED HISTORY:  Reason for exam:->check dvt  Ordering Physician Provided Reason for Exam: Rt arm swelling  Acuity: Acute  Type of Exam: Initial    FINDINGS:  There is normal flow and compressibility of the visualized venous structures.  There is no evidence of echogenic thrombus.    Impression:     No evidence of DVT.    VL DUP UPPER EXTREMITY ARTERIES RIGHT [[id number]] Collected: 09/16/17 0823    Order Status: Completed Updated: 09/16/17 0829    Narrative:     EXAMINATION:  DUPLEX ARTERAL  ULTRASOUND OF THE RIGHT UPPER EXTREMITY 09/16/2017 5:19 am    TECHNIQUE:  Duplex ultrasound and Doppler images were obtained of the lower extremity.    COMPARISON:  None.    HISTORY:  ORDERING SYSTEM PROVIDED HISTORY: check aneurysm  TECHNOLOGIST PROVIDED HISTORY:  Reason for exam:->check aneurysm  Ordering Physician Provided Reason for Exam: Lump upper arm rt. Suspect BA  aneurysm  Acuity: Unknown  Type of Exam: Initial    FINDINGS:  Grayscale and color-flow Doppler imaging of the arteries in the right upper  extremity shows no evidence of aneurysm at the level of the brachial artery.  Diffuse mild atherosclerotic plaque with moderate atherosclerotic plaque  within the radial and ulnar arteries. No level of significant stenosis.    Flow velocities were measured as follows:        Carotid 69 cm/sec    Subclavian 73 cm/sec    Axillary 119 cm/sec    Brachial 111 cm/sec    Radial 77 cm/sec    Ulnar 79 cm/sec    Impression:     1. No evidence of brachial artery aneurysm.  2. Diffuse atherosclerotic changes in the arteries in the right upper  extremity.    VL DUP LOWER EXTREMITY ARTERIES RIGHT [[id number]]     Order Status: Canceled             Relevant labs and imaging reviewed    ASSESSMENT AND PLAN     AKI on CKD4  - renal assisting  - Cr now baseline     Erosive esophagitis - LA grade C  -PPI, odynophagia is better  - avoid NSAIDs    Left hip fluid collection -incidental finding on imaging- aspirated in IR 12/26  - Synovial fluid studies, WBC not suggestive of infection  - Ortho rec at discharge, patient to follow up with surgeon who performed total hip in 2-4 weeks     Cerumen impaction  - debrox solution    Pancreatic atrophy with multiple intraparenchymal cystic masses on CT.Differential diagnosis includes, but is not limited, to chronic pancreatitis with pseudocyst formation and IPMN (intraductal papillary mucinous neoplasm).  -CA 19-9 is high, GI d/w patient and decision to refer to [facility] for EUS and  pancreas bx. Patient instructed by GI to call Dr Dellia Nims office as outpatient to arrange EUS (GI provided written instructions to patient)      Significant constipation  -12/27 - resolved with laxatives     Anemia, macrocytic   - could be related to CKD vs. MDS as differential  - f/u outpatient with hematology  Hx of DCIS right breast s/p lumpectomy in 2012  -continue anastrazole    Underweight Body mass index is 20.62 kg/m    Discharge awaiting pre-cert. D/w case management this afternoon, still pending [facility] pre-cert      [place], Internal Medicine  09/25/2017 at 3:11 PM

## 2017-09-25 NOTE — Care Coordination-Inpatient (Addendum)
11:51 - LSW called Amy/[facility]. LSW had to leave a message.     2:30 - Call from [facility] is still pending.     Electronically signed by Bunnie Pion, LSW on 09/25/2017 at 2:42 PM

## 2017-09-25 NOTE — Progress Notes (Signed)
Physical Therapy    Physical Therapy Treatment Note  Name: Judy Velasquez MRN: [id number] DOB:   1929/06/01   Date:  09/25/2017   Admission Date: 09/16/2017 Room:  4116/4116-A   Restrictions/Precautions:  Restrictions/Precautions  Restrictions/Precautions: General Precautions, Fall Risk, Up as Tolerated  Implants present? :  (bil THR, R TKR)       Communication with other providers:  Per nurse ok to tx and OK per discussed with nurse Judy Velasquez putting elasticated tube bandage on right forearm to help decrease edema.   Subjective:  Patient states:  Pt agreeable to tx but c/o right forearm very tender.  Pain:   Location, Type, Intensity (0/10 to 10/10):  No c/o pain  Objective:    Observation:  Alert and oriented. Pt has edema in right hand and forearm.    Treatment, including education/measures:  Sit<=>stand from chair and commode cga  Pt sba for peri care. Pt was able to stand at sink x 5 minutes washing hands and brushing teeth with sba/cga. Pt did need assist due to blindness to put cap back on toothpaste.   amb with rw 15' x 2 60' x1 cga and cues for walker safety with turning.  Ex in sitting:  4 reps trunk stretches with shoulder flex and cues for deep breathing.  Right arm held at shoulder level and pt squeezing hand x 10 reps and noticeable decrease in edema in had and forearm. Arm placed on pillow during leg ex and appeared to help maintain decrease in edema. At end of tx stocking applied on right forearm and nurse reports she will check on it. Pt instructed if it causes pain or discomfort to put  Call light on.  10 reps aps and circles  10 reps laqs.  Safety  Patient left safely in the chair, with call light/phone in reach with alarm applied. Gait belt was used for transfers and gait.   Assessment / Impression:       Patient's tolerance of treatment:  good   Adverse Reaction: na  Significant change in status and impact:  na  Barriers to improvement:  Strength and safety  Plan for Next Session:    Cont. POC  Time  in:  1425  Time out:  1520  Timed treatment minutes:  55  Total treatment time:  33    Previously filed items:  Social/Functional History  Lives With:  (granddtr and her 49 y.o. son; son lives next door)  Type of Home: House  Home Layout: Two level, Able to Live on Main level with bedroom/bathroom  Home Access: Stairs to enter with rails  Entrance Stairs - Number of Steps: 3  Entrance Stairs - Rails: Both  Bathroom Shower/Tub: Civil engineer, contracting with back  Biochemist, clinical: Soil scientist: Engineer, site: 4 wheeled walker, Radio producer, Teacher, English as a foreign language, IT sales professional, Theatre stage manager Help From: Family  ADL Assistance:  (granddtr provides sup for bathing d/t prior fall out of tub)  Homemaking Assistance:  (granddtr is primary for laundry, pt folds laundry; granddtr does all addtl cleaning; pt does some meal prep - mainly uses microwave)  Homemaking Responsibilities: No  Ambulation Assistance:  (4WW at all times)  Transfer Assistance: Needs assistance (needs some A w/tub transfer; has A from granddtr getting in/out of bed several times a night d/t frequent urination)  Active Driver: No  Occupation: Retired  Additional Comments: pt independent prior to recent cataract sx. now requires increased assist for bathing, homemaking, transportation. Still mod I  for gait, transfers, dressing. Judy Velasquez is w/pt all the time but pt states she is worried about her because she is not getting sleep with assisting her and taking care of her 62 y.o. son. Pt's son is unable to A - disabled per pt.  Short term goals  Time Frame for Short term goals: one week  Short term goal 1: pt will increase tinetti score to 18  Short term goal 2: pt will transfer consistently at [facility] with good hand placement   Short term goal 3: pt will ambulate 250 ft, safely navigating obstacles with LRAD        Electronically signed by:    Markham Jordan PTA  09/25/2017, 8:17 AM

## 2017-09-25 NOTE — Plan of Care (Signed)
Problem: Falls - Risk of:  Goal: Will remain free from falls  Will remain free from falls   Outcome: Ongoing    Goal: Absence of physical injury  Absence of physical injury   Outcome: Ongoing      Problem: Pain:  Goal: Pain level will decrease  Pain level will decrease   Outcome: Ongoing    Goal: Control of acute pain  Control of acute pain   Outcome: Ongoing    Goal: Control of chronic pain  Control of chronic pain   Outcome: Ongoing      Problem: Risk for Impaired Skin Integrity  Goal: Tissue integrity - skin and mucous membranes  Structural intactness and normal physiological function of skin and  mucous membranes.   Outcome: Ongoing      Problem: Musculor/Skeletal Functional Status  Goal: Highest potential functional level  Outcome: Ongoing

## 2017-09-25 NOTE — Progress Notes (Signed)
[facility] [address] [place] [address]  Phone: [phone]  Office Hours: 8:30AM - 4:30PM  Monday - Friday     BP better  cont the procardia  Creat 2.7  K normal  No added suggestions

## 2017-09-26 MED ORDER — PANTOPRAZOLE SODIUM 40 MG PO TBEC
40 MG | ORAL_TABLET | Freq: Two times a day (BID) | ORAL | 3 refills | Status: DC
Start: 2017-09-26 — End: 2017-12-02

## 2017-09-26 MED ORDER — CARVEDILOL 3.125 MG PO TABS
3.125 MG | ORAL_TABLET | Freq: Two times a day (BID) | ORAL | 1 refills | Status: AC
Start: 2017-09-26 — End: ?

## 2017-09-26 MED ORDER — DARBEPOETIN ALFA 25 MCG/ML IJ SOLN
25 MCG/ML | Freq: Once | INTRAMUSCULAR | Status: AC
Start: 2017-09-26 — End: 2017-09-26
  Administered 2017-09-26: 16:00:00 25 ug via SUBCUTANEOUS

## 2017-09-26 MED ORDER — DOCUSATE SODIUM 100 MG PO CAPS
100 MG | ORAL_CAPSULE | Freq: Two times a day (BID) | ORAL | 1 refills | Status: AC
Start: 2017-09-26 — End: ?

## 2017-09-26 MED ORDER — NIFEDIPINE ER 30 MG PO TB24
30 MG | ORAL_TABLET | Freq: Two times a day (BID) | ORAL | 3 refills | Status: AC
Start: 2017-09-26 — End: ?

## 2017-09-26 MED ORDER — CARBAMIDE PEROXIDE 6.5 % OT SOLN
6.5 % | Freq: Two times a day (BID) | OTIC | 0 refills | Status: AC
Start: 2017-09-26 — End: 2017-10-26

## 2017-09-26 MED ORDER — ISOSORBIDE DINITRATE 30 MG PO TABS
30 MG | ORAL_TABLET | Freq: Three times a day (TID) | ORAL | 3 refills | Status: AC
Start: 2017-09-26 — End: ?

## 2017-09-26 MED ORDER — HYDRALAZINE HCL 100 MG PO TABS
100 MG | ORAL_TABLET | Freq: Three times a day (TID) | ORAL | 3 refills | Status: AC
Start: 2017-09-26 — End: ?

## 2017-09-26 MED FILL — TRAMADOL HCL 50 MG PO TABS: 50 mg | ORAL | Qty: 1

## 2017-09-26 MED FILL — ISOSORBIDE DINITRATE 10 MG PO TABS: 10 mg | ORAL | Qty: 1

## 2017-09-26 MED FILL — HEPARIN SODIUM (PORCINE) 5000 UNIT/ML IJ SOLN: 5000 [IU]/mL | INTRAMUSCULAR | Qty: 1

## 2017-09-26 MED FILL — CARVEDILOL 3.125 MG PO TABS: 3.125 mg | ORAL | Qty: 1

## 2017-09-26 MED FILL — ONDANSETRON HCL 4 MG/2ML IJ SOLN: 4 MG/2ML | INTRAMUSCULAR | Qty: 2

## 2017-09-26 MED FILL — HYDRALAZINE HCL 50 MG PO TABS: 50 mg | ORAL | Qty: 2

## 2017-09-26 MED FILL — DOK 100 MG PO CAPS: 100 mg | ORAL | Qty: 1

## 2017-09-26 MED FILL — SENNA-LAX 8.6 MG PO TABS: 8.6 mg | ORAL | Qty: 2

## 2017-09-26 MED FILL — PANTOPRAZOLE SODIUM 40 MG PO TBEC: 40 mg | ORAL | Qty: 1

## 2017-09-26 MED FILL — ANASTROZOLE 1 MG PO TABS: 1 mg | ORAL | Qty: 1

## 2017-09-26 MED FILL — FERREX 150 150 MG PO CAPS: 150 mg | ORAL | Qty: 1

## 2017-09-26 MED FILL — POLYETHYLENE GLYCOL 3350 17 G PO PACK: 17 g | ORAL | Qty: 1

## 2017-09-26 MED FILL — NIFEDICAL XL 30 MG PO TB24: 30 mg | ORAL | Qty: 1

## 2017-09-26 MED FILL — ARANESP (ALBUMIN FREE) 25 MCG/ML IJ SOLN: 25 ug/mL | INTRAMUSCULAR | Qty: 1

## 2017-09-26 NOTE — Discharge Instructions (Signed)
Follow up with hematology Dr Arvella Nigh to evaluate anemia    Follow up with PCP and nephrology Dr Maretta Bees for BP check and chronic kidney disease management    Call GI Dr Edwena Felty office to get referral to [facility] for pancreas investigation    Avoid non steroidal anti-inflammatories - e.g naproxen, ibuprofen, indomethacin    Left hip fluid collection - seen by ortho here at [facility] - no evidence of infection. However, it was recommended that you  follow up with surgeon who performed total hip in 2-4 weeks

## 2017-09-26 NOTE — Care Coordination-Inpatient (Signed)
1/5 CM received VM from [facility] that pt was set up with [place] home [facility]. CM was informed that [facility] will follow through with providing information to [facility]. To  assure continuity of care for pt, CM faxed order, AVS and notes to [facility] at 0730.

## 2017-09-26 NOTE — Plan of Care (Signed)
Problem: Falls - Risk of:  Goal: Will remain free from falls  Will remain free from falls   Outcome: Ongoing    Goal: Absence of physical injury  Absence of physical injury   Outcome: Ongoing      Problem: Pain:  Goal: Pain level will decrease  Pain level will decrease   Outcome: Ongoing    Goal: Control of acute pain  Control of acute pain   Outcome: Ongoing    Goal: Control of chronic pain  Control of chronic pain   Outcome: Ongoing      Problem: Risk for Impaired Skin Integrity  Goal: Tissue integrity - skin and mucous membranes  Structural intactness and normal physiological function of skin and  mucous membranes.   Outcome: Ongoing      Problem: Musculor/Skeletal Functional Status  Goal: Highest potential functional level  Outcome: Ongoing

## 2017-09-26 NOTE — Progress Notes (Signed)
Received referral from E[facility]... Pt ins out of network & pt would have a 50% co-pay to use [facility]. Left VM for CM.

## 2017-09-26 NOTE — Care Coordination-Inpatient (Addendum)
CM called and left VM for Amy at [facility] to discuss pending precert. CM will continue to follow. [place]    1030 CM received call from Arthur at [facility] and pt has been denied for SNF placement at [facility]. CM was informed that If the Dr would like [facility] [phone].   CM placed a PS to Dr Ronny Flurry.   1055 CM informed that the plan will be to send pt home today.  CM into see pt and informed of the denial. CM discussed home care option. Pt remains agreeable as before that she would like [facility] to follow her. CM provided her with the contact information. CM received permission to call Willette Pa who is primary care giver. CM placed a PS to Sh[facility] to inform of referral.   CM called and informed [name] of discharge plan and follow up with [facility]. Grd dtr will provided transportation when discharged.  CM remains available for any needs or concerns. LH  1230 CM informed by [facility] that pt is out of network. CM called [employer], [facility] in [place]. Unable to accept pt.  1300 CM called [facility] and spoke with Roselyn Reef. CM instructed to fax order AVS, and notes to [phone]. [facility] will find a home care company and then contact the pt.   From a CM standpoint pt is ready for discharge. Pt plans to return home with grand dtr Audelia Acton as primary caregiver. Pt will be receiving home care provided by [facility]. [place]

## 2017-09-26 NOTE — Discharge Summary (Addendum)
Judy Velasquez December 17, 1928 [id number]  PCP:  [name], MD    Admit date: 09/16/2017  Admitting Physician: Vernie Ammons, MD    Discharge date: 09/26/2017 Discharge Physician: Vernie Ammons, MD      Reason for admission: No chief complaint on file.    Present on Admission:  . ARF (acute renal failure) ([place])  . AKI (acute kidney injury) ([place])  . Traumatic seroma of left lower leg       Discharge Diagnoses & Hospital Course::     AKI on CKD4  - renal assisting  - Cr now baseline     Erosive esophagitis - LA grade C  -PPI, odynophagia is better  - avoid NSAIDs    Left hip fluid collection -incidental finding on imaging- aspirated in IR 12/26  - Synovial fluid studies, WBC not suggestive of infection  - Ortho rec at discharge, patient to follow up with surgeon who performed total hip in 2-4 weeks     Cerumen impaction  - debrox solution    Pancreatic atrophy with multiple intraparenchymal cystic masses on CT.Differential diagnosis includes, but is not limited, to chronic pancreatitis with pseudocyst formation and IPMN (intraductal papillary mucinous neoplasm).  -CA 19-9 is high, GI d/w patient and decision to refer to [facility] for EUS and pancreas bx. Patient instructed by GI to call Dr Dellia Nims office as outpatient to arrange EUS (GI provided written instructions to patient)      Significant constipation  -12/27 - resolved with laxatives     Anemia, macrocytic   - could be related to CKD vs. MDS as differential  - f/u outpatient with hematology    Hx of DCIS right breast s/p lumpectomy in 2012  -continue anastrazole    Underweight Body mass index is 20.62 kg/m    Insurance did not approve SNF. Will therefore discharge with [place]    Exam:   Wt Readings from Last 3 Encounters:   09/26/17 99 lb (44.9 kg)   09/15/17 95 lb (43.1 kg)   08/18/17 95 lb (43.1 kg)       Blood pressure 130/63, pulse 59, temperature 97.3 F (36.3 C), temperature source Oral, resp. rate 20, height 4' 9.24" (1.454 m), weight 99 lb  (44.9 kg), SpO2 97 %, not currently breastfeeding.    General - AAO x 3  Psych - Appropriate affect/speech. No agitation  Eyes - Eye lids intact. No scleral icterus  ENT - Lips wnl. External ear clear/dry/intact. No thyromegaly on inspection  Neuro - No gross peripheral or central neuro deficits on inspection  Heart - Sinus. RRR. S1 and S2 present.   Lung - Adequate air entry b/l, No crackles/wheezes appreciated  GI -  Soft. No guarding/rigidity. BS+  GU - No CVA/suprapubic tenderness or palpable bladder distension  Skin - Intact. No rash/petechiae/ecchymosis. Warm extremities      Significant Diagnostic Studies:   CBC: No results for input(s): WBC, HGB, PLT in the last 72 hours.  WBC   Date/Time Value Ref Range Status   09/21/2017 06:04 AM 4.8 4.0 - 10.5 K/CU MM Final   09/20/2017 07:28 AM 4.5 4.0 - 10.5 K/CU MM Final   09/19/2017 06:18 AM 5.2 4.0 - 10.5 K/CU MM Final     Hemoglobin   Date/Time Value Ref Range Status   09/21/2017 06:04 AM 7.7 (L) 12.5 - 16.0 GM/DL Final   09/20/2017 07:28 AM 7.1 (L) 12.5 - 16.0 GM/DL Final   09/19/2017 06:18 AM  7.9 (L) 12.5 - 16.0 GM/DL Final     Platelets   Date/Time Value Ref Range Status   09/21/2017 06:04 AM 266 140 - 440 K/CU MM Final   09/20/2017 07:28 AM 252 140 - 440 K/CU MM Final   09/19/2017 06:18 AM 290 140 - 440 K/CU MM Final    CMP:  Recent Labs      09/24/17   0525   NA  135   K  4.5   CL  102   CO2  23   BUN  40*   CREATININE  2.7*   CALCIUM  7.6*     Troponin: No results for input(s): TROPONINI in the last 72 hours.  BNP: No results for input(s): BNP in the last 72 hours.  Lipids: No results for input(s): CHOL, HDL in the last 72 hours.    Invalid input(s): LDLCALCU  ABGs:No results for input(s): PH, PCO2ART, PO2ART, BE, HCO3ART, CO2CT, O2SAT, LABCARB in the last 72 hours.    Invalid input(s):  METHGBART    Glucose: No results for input(s): POCGLU in the last 72 hours.  Magnesium:   Recent Labs      09/24/17   0525  09/25/17   0624   MG  2.8*  2.8*     Phosphorus:No  results for input(s): PHOS in the last 72 hours.  INR: No results for input(s): INR in the last 72 hours.      Patient Instructions:   Judy Velasquez, Judy Velasquez   Home Medication Instructions [id number]    Printed on:09/26/17 1241   Medication Information                      anastrozole (ARIMIDEX) 1 MG tablet  Take 1 tablet by mouth daily             B Complex-Biotin-FA (SUPER B-50 B COMPLEX PO)  Take by mouth             carbamide peroxide (DEBROX) 6.5 % otic solution  Place 5 drops into both ears 2 times daily For 1 week             carvedilol (COREG) 3.125 MG tablet  Take 1 tablet by mouth 2 times daily             docusate sodium (PHILLIPS STOOL SOFTENER) 100 MG capsule  Take 1 capsule by mouth 2 times daily             dorzolamide-timolol (COSOPT) 22.3-6.8 MG/ML ophthalmic solution  1 drop 2 times daily             fluticasone (FLONASE) 50 MCG/ACT nasal spray  1 spray by Nasal route daily             hydrALAZINE (APRESOLINE) 100 MG tablet  Take 1 tablet by mouth 3 times daily             iron polysaccharides (FERREX 150) 150 MG capsule  Take 150 mg by mouth 2 times daily             isosorbide dinitrate (ISORDIL) 30 MG tablet  Take 1 tablet by mouth 3 times daily             NIFEdipine (ADALAT CC) 30 MG extended release tablet  Take 1 tablet by mouth 2 times daily             pantoprazole (PROTONIX) 40 MG tablet  Take 1 tablet by mouth 2 times daily (  before meals)                  Code Status: Full Code     Consults:   IP CONSULT TO NEPHROLOGY  IP CONSULT TO GI  IP CONSULT TO ORTHOPEDIC SURGERY  IP CONSULT TO INTERVENTIONAL RADIOLOGY  IP CONSULT TO HOME CARE NEEDS  IP CONSULT TO HOME CARE NEEDS    Diet: cardiac diet and renal diet    Activity: activity as tolerated   Work:    Discharged Condition: fair    Prognosis: Fair    Disposition: home      Follow-up with     Follow-up With  Details  Why  Contact Info   [name], MD  Schedule an appointment as soon as possible for a visit  for post hospitalization  care and to ensure that your appointments as made with consultants  [address] [place] [address]  [phone]   [name], MD  Call  call Dr Edwena Felty office for referral to [facility] for pancreas testing  [address]  [place] [address]  [phone]   Mickie Bail, MD  Schedule an appointment as soon as possible for a visit in 1 week  for investigation of anemia  [place] [address]  [phone]   Claretta Fraise, MD  Schedule an appointment as soon as possible for a visit in 2 weeks  for nephrology follow up of CKD  [place]  [place] [address]  [phone]   Left hip fluid collection - seen by ortho here at [facility] - no evidence of infection. However, it was recommended that you follow up with surgeon who performed total hip in 2-4 weeks     Left hip fluid collection - seen by ortho here at [facility] - no evidence of infection. However, it was recommended that you follow up with surgeon who performed total hip in 2-4 weeks   Left hip fluid collection - seen by ortho here at [facility] - no evidence of infection. However, it was recommended that youfollow up with surgeon who performed total hip in 2-4 weeks             Discharge Physician Signed:   [place], Internal medicine  09/26/2017 at 12:41 PM    The patient was seen and examined on day of discharge and this discharge summary is in conjunction with any daily progress note from day of discharge.  Time spent on discharge in the examination, evaluation, counseling and review of medications and discharge plan: <30 minutes    Please forward this discharge summary to patient's PCP

## 2017-09-26 NOTE — Progress Notes (Signed)
BP well controlled  Will follow up in office in 1-2 weeks  May need ESA as OP

## 2017-11-13 ENCOUNTER — Encounter

## 2017-11-13 ENCOUNTER — Inpatient Hospital Stay: Admit: 2017-11-13 | Payer: MEDICARE | Primary: Internal Medicine

## 2017-11-13 DIAGNOSIS — M79604 Pain in right leg: Secondary | ICD-10-CM

## 2017-12-02 ENCOUNTER — Inpatient Hospital Stay
Admit: 2017-12-02 | Discharge: 2017-12-02 | Disposition: A | Discharge status: Home / Self Care | Payer: MEDICARE | Attending: Emergency Medicine

## 2017-12-02 ENCOUNTER — Emergency Department: Admit: 2017-12-02 | Payer: MEDICARE | Primary: Internal Medicine

## 2017-12-02 DIAGNOSIS — S0101XA Laceration without foreign body of scalp, initial encounter: Secondary | ICD-10-CM

## 2017-12-02 NOTE — ED Provider Notes (Signed)
The history is provided by the patient and a relative.   Fall   The pain is at a severity of 2/10. She was ambulatory at the scene. There was no entrapment after the fall. There was no drug use involved in the accident. There was no alcohol use involved in the accident. Pertinent negatives include no visual change, no fever, no numbness, no abdominal pain, no bowel incontinence, no nausea, no vomiting, no hematuria, no headaches, no hearing loss, no loss of consciousness and no tingling.   Patient fell while at home and hit head on walker as she fell down. States she just slid out of her lift chair. Has laceration to forehead and complaints of right hip pain    Review of Systems   Constitutional: Negative.  Negative for fever.   HENT: Negative.    Eyes: Negative.    Respiratory: Negative.    Cardiovascular: Negative.    Gastrointestinal: Negative.  Negative for abdominal pain, bowel incontinence, nausea and vomiting.   Genitourinary: Negative.  Negative for hematuria.   Musculoskeletal: Negative.    Skin: Negative.    Neurological: Negative.  Negative for tingling, loss of consciousness, numbness and headaches.   All other systems reviewed and are negative.      Family History   Problem Relation Age of Onset   ??? Heart Disease Mother    ??? Cancer Mother         "Cancer In Her Eye"   ??? Depression Mother    ??? Mental Illness Mother    ??? Diabetes Father    ??? Heart Disease Father         "Heart Attack"   ??? Hearing Loss Father    ??? Cancer Sister         "Breast Cancer"   ??? Early Death Brother         "At [employer]"   ??? Diabetes Sister    ??? Vision Loss Son         "Eye Problems"   ??? Other Son         "Alot of health problems due to motorcycle wreck"   ??? Arthritis Daughter    ??? Early Death Son         "Early 73's"     Social History     Socioeconomic History   ??? Marital status: Widowed     Spouse name: Not on file   ??? Number of children: Not on file   ??? Years of education: Not on file   ??? Highest education level: Not on file    Occupational History   ??? Not on file   Social Needs   ??? Financial resource strain: Not on file   ??? Food insecurity:     Worry: Not on file     Inability: Not on file   ??? Transportation needs:     Medical: Not on file     Non-medical: Not on file   Tobacco Use   ??? Smoking status: Never Smoker   ??? Smokeless tobacco: Never Used   Substance and Sexual Activity   ??? Alcohol use: No   ??? Drug use: No   ??? Sexual activity: Never   Lifestyle   ??? Physical activity:     Days per week: Not on file     Minutes per session: Not on file   ??? Stress: Not on file   Relationships   ??? Social connections:     Talks on phone:  Not on file     Gets together: Not on file     Attends religious service: Not on file     Active member of club or organization: Not on file     Attends meetings of clubs or organizations: Not on file     Relationship status: Not on file   ??? Intimate partner violence:     Fear of current or ex partner: Not on file     Emotionally abused: Not on file     Physically abused: Not on file     Forced sexual activity: Not on file   Other Topics Concern   ??? Not on file   Social History Narrative    Do you donate blood or plasma? No    Caffeine intake? Moderate    Advance directive? No    Is blood transfusion acceptable in an emergency? Yes    Live alone or with others? With others    Sunscreen used routinely? No able to care for self? Yes             Past Surgical History:   Procedure Laterality Date   ??? BREAST LUMPECTOMY  05/20/2011    Right   ??? BREAST SURGERY  7-12    Right Breast Biopsy   ??? CARDIAC SURGERY  2008    CABG (3 Bypasses)   ??? CARPAL TUNNEL RELEASE  2000's    Right   ??? CESAREAN SECTION  "Late 1950's or Early 1960's"    X 3   ??? CHOLECYSTECTOMY, LAPAROSCOPIC  1990's   ??? COLONOSCOPY  2000's   ??? CORONARY ARTERY BYPASS GRAFT  2008    cabg x 3   ??? ENDOSCOPY, COLON, DIAGNOSTIC  1990's   ??? HYSTERECTOMY, TOTAL ABDOMINAL  1960's   ??? JOINT REPLACEMENT  1980's    Total Right Hip   ??? JOINT REPLACEMENT  2000's    Total Right  Hip   ??? JOINT REPLACEMENT  1990's    Total Left Hip   ??? JOINT REPLACEMENT  1980's    Total Right Knee   ??? OTHER SURGICAL HISTORY      Family Physician Is Dr. Berneice Heinrich In [place], [place]   ??? OTHER SURGICAL HISTORY  2000's    "Took section of my intestines out, it was a exploratory operation, they found scar tissue from the c-sections"   ??? TONSILLECTOMY AND ADENOIDECTOMY  1939   ??? UPPER GASTROINTESTINAL ENDOSCOPY N/A 09/18/2017    EGD BIOPSY performed by Roylene Reason, MD at [facility] ENDOSCOPY     Past Medical History:   Diagnosis Date   ??? Allergic rhinitis    ??? Anemia    ??? Anesthesia     Nausea/Vomiting Post Op In Past   ??? Anxiety    ??? Arthritis     s/p bilateral hip and right knee replacement sees Dr. Salomon Mast   ??? Back problem     "Occ Back Hurts"   ??? Blood transfusion    ??? CAD (coronary artery disease) 2008    Sees Dr. Marcelino Freestone CABG x 3   ??? DCIS (ductal carcinoma in situ) 05/20/2011   ??? Glaucoma     Bilateral Eyes   ??? H/O cardiovascular stress test 05/20/11   ??? Heart attack (HCC) 2006   ??? HOH (hard of hearing)     Bilateral Ears   ??? Hyperlipidemia    ??? Hypertension    ??? IBS (irritable bowel syndrome)    ??? Kidney  problem     "Sees  Dr. Wallis Bamberg For Blood Pressure And Kidneys"   ??? Nausea & vomiting     Nausea/Vomiting Post Op In Past   ??? Osteoarthritis    ??? Osteoporosis    ??? Right Breast Cancer Dx 7-12   ??? Shortness of breath on exertion    ??? Staph infection 1980's    "They sent me to a tropical infection doctor"   ??? TB (tuberculosis)     "Probably had it as a child, my dad had it"   ??? Type II or unspecified type diabetes mellitus without mention of complication, not stated as uncontrolled Dx 1980's     Allergies   Allergen Reactions   ??? Celebrex [Celecoxib] Shortness Of Breath, Swelling and Anaphylaxis     "Swelling Of The Esophagus"   ??? Fish Allergy Anaphylaxis   ??? Lisinopril Other (See Comments)     Dry Mouth   ??? Neurontin [Gabapentin] Other (See Comments)     Dizziness   ??? Norvasc [Amlodipine Besylate] Other (See  Comments)     Edema Of Legs   ??? Other      "Allergic To Certain Seafoods Causing Headaches, Difficulty Breathing"   ??? Shellfish Allergy Anaphylaxis   ??? Ciprofloxacin Rash   ??? Lipitor Other (See Comments)     Ache   ??? Losartan      Per nephrologist     Prior to Admission medications    Medication Sig Start Date End Date Taking? Authorizing Provider   HYDROcodone-acetaminophen (NORCO) 5-325 MG per tablet Take 1 tablet by mouth every 6 hours as needed for Pain.   Yes Historical Provider, MD   furosemide (LASIX) 40 MG tablet Take 40 mg by mouth 2 times daily   Yes Historical Provider, MD   isosorbide dinitrate (ISORDIL) 30 MG tablet Take 1 tablet by mouth 3 times daily 09/26/17   Alva Garnet, MD   hydrALAZINE (APRESOLINE) 100 MG tablet Take 1 tablet by mouth 3 times daily 09/26/17   Alva Garnet, MD   carvedilol (COREG) 3.125 MG tablet Take 1 tablet by mouth 2 times daily 09/26/17   Alva Garnet, MD   NIFEdipine (ADALAT CC) 30 MG extended release tablet Take 1 tablet by mouth 2 times daily 09/26/17   Alva Garnet, MD   docusate sodium (PHILLIPS STOOL SOFTENER) 100 MG capsule Take 1 capsule by mouth 2 times daily 09/26/17   Alva Garnet, MD   B Complex-Biotin-FA (SUPER B-50 B COMPLEX PO) Take by mouth    Historical Provider, MD   fluticasone (FLONASE) 50 MCG/ACT nasal spray 1 spray by Nasal route daily    Historical Provider, MD       BP (!) 153/71    Pulse 71    Temp 97.6 ??F (36.4 ??C) (Temporal)    Resp 18    Ht 4\' 8"  (1.422 m)    Wt 107 lb (48.5 kg)    SpO2 100%    BMI 23.99 kg/m??     Physical Exam   Constitutional: She is oriented to person, place, and time. She appears well-developed and well-nourished.   HENT:   Head: Normocephalic.   Right Ear: External ear normal.   Left Ear: External ear normal.   Nose: Nose normal.   Mouth/Throat: Oropharynx is clear and moist.   2 cm scalp laceration forehead in hairline   Eyes: Pupils are equal, round, and reactive to light. Conjunctivae and EOM are normal.   Neck: Normal range of  motion. Neck supple. No spinous process tenderness and no muscular tenderness present. No neck rigidity. Normal range of motion present.   Cardiovascular: Normal rate, regular rhythm, normal heart sounds and intact distal pulses.   Pulmonary/Chest: Effort normal and breath sounds normal.   Abdominal: Soft. Bowel sounds are normal.   Musculoskeletal: Normal range of motion. She exhibits tenderness. She exhibits no deformity.        Left hip: She exhibits tenderness and bony tenderness. She exhibits normal range of motion, normal strength, no swelling, no crepitus and no deformity.   Neurological: She is alert and oriented to person, place, and time. GCS eye subscore is 4. GCS verbal subscore is 5. GCS motor subscore is 6.   Skin: Skin is warm and dry.   Psychiatric: She has a normal mood and affect. Her behavior is normal. Judgment and thought content normal.       MDM:    No results found for this visit on 12/02/17.   XR HIP RIGHT (2-3 VIEWS)   Final Result   No definite acute fracture is identified.  Bilateral hip arthroplasties with   intact hardware.         CT Head WO Contrast   Final Result   1. No acute intracranial abnormality.   2. Mild chronic small vessel ischemic changes.             Lac Repair  Date/Time: 12/02/2017 6:50 PM  Performed by: Roberto Scales, DO  Authorized by: Roberto Scales, DO     Consent:     Consent obtained:  Verbal    Consent given by:  Patient    Risks discussed:  Infection, need for additional repair, nerve damage, poor wound healing, poor cosmetic result, pain, retained foreign body and tendon damage    Alternatives discussed:  No treatment, delayed treatment, referral and observation  Universal protocol:     Procedure explained and questions answered to patient or proxy's satisfaction: yes      Relevant documents present and verified: yes      Test results available and properly labeled: yes      Imaging studies available: yes      Required blood products, implants, devices,  and special equipment available: yes      Site/side marked: yes      Immediately prior to procedure, a time out was called: yes      Patient identity confirmed:  Verbally with patient  Anesthesia (see MAR for exact dosages):     Anesthesia method:  Local infiltration    Local anesthetic:  Lidocaine 1% WITH epi  Laceration details:     Location:  Scalp    Length (cm):  2  Repair type:     Repair type:  Simple  Pre-procedure details:     Preparation:  Patient was prepped and draped in usual sterile fashion and imaging obtained to evaluate for foreign bodies  Exploration:     Hemostasis achieved with:  Direct pressure    Wound exploration: entire depth of wound probed and visualized      Contaminated: no    Treatment:     Area cleansed with:  Hibiclens    Amount of cleaning:  Extensive    Irrigation solution:  Sterile saline    Irrigation method:  Pressure wash    Visualized foreign bodies/material removed: no    Skin repair:     Repair method:  Sutures    Suture size:  5-0  Suture material:  Fast-absorbing gut    Suture technique:  Running locked  Post-procedure details:     Dressing:  Antibiotic ointment and sterile dressing    Patient tolerance of procedure:  Tolerated well, no immediate complications            My typical dicussion, presentation,and considerations for this patients' chief complaint, diagnosis, differential diagnosis, medications, medication use,  medication safety and medication interactions have been explained and outlined to this patient for thispatient encounter. I have stressed need for follow up and reexamination for this encounter and or return to the emergency department if any changes or any concern.      Final Impression    1. Injury of head, initial encounter    2. Fall, initial encounter    3. Laceration of scalp, initial encounter              Crissie Reese Maniah Nading, DO  12/02/17 1853

## 2017-12-02 NOTE — ED Notes (Signed)
Bandage placed over sutures. Patient ambulated to bedside commode with assistance.      Almyra Free, RN  12/02/17 [phone]

## 2017-12-02 NOTE — Discharge Instructions (Signed)
Stitches will dissolve

## 2017-12-02 NOTE — ED Triage Notes (Signed)
Patient fell while at home and hit head on walker as she fell down. States she just slid out of her lift chair. Has laceration to forehead and complaints of right hip pain

## 2018-01-13 ENCOUNTER — Emergency Department: Admit: 2018-01-13 | Payer: MEDICARE | Primary: Internal Medicine

## 2018-01-13 ENCOUNTER — Inpatient Hospital Stay
Admit: 2018-01-13 | Discharge: 2018-01-13 | Disposition: A | Discharge status: Home / Self Care | Payer: MEDICARE | Attending: Emergency Medicine

## 2018-01-13 DIAGNOSIS — S0101XA Laceration without foreign body of scalp, initial encounter: Secondary | ICD-10-CM

## 2018-01-13 NOTE — ED Provider Notes (Signed)
The history is provided by the patient.   Laceration   Location: scalp.  Length:  4 cm  Bleeding: controlled    Laceration mechanism:  Fall and blunt object (fell and hit head on a wall)  Pain details:     Quality:  Aching and dull    Severity:  Mild    Timing:  Constant    Progression:  Unchanged  Foreign body present:  No foreign bodies  Relieved by:  Pressure and certain positions  Worsened by:  Nothing  Ineffective treatments:  None tried  Tetanus status:  Up to date  Associated symptoms: no fever, no focal weakness, no numbness, no rash, no redness, no swelling and no streaking    Associated symptoms comment:  No LOC      Review of Systems   Constitutional: Negative.  Negative for fever.   HENT: Negative.    Eyes: Negative.    Respiratory: Negative.    Cardiovascular: Negative.    Gastrointestinal: Negative.    Genitourinary: Negative.    Musculoskeletal: Negative.    Skin: Negative.  Negative for rash.   Neurological: Negative.  Negative for focal weakness.   All other systems reviewed and are negative.      Family History   Problem Relation Age of Onset   ??? Heart Disease Mother    ??? Cancer Mother         "Cancer In Her Eye"   ??? Depression Mother    ??? Mental Illness Mother    ??? Diabetes Father    ??? Heart Disease Father         "Heart Attack"   ??? Hearing Loss Father    ??? Cancer Sister         "Breast Cancer"   ??? Early Death Brother         "At [employer]"   ??? Diabetes Sister    ??? Vision Loss Son         "Eye Problems"   ??? Other Son         "Alot of health problems due to motorcycle wreck"   ??? Arthritis Daughter    ??? Early Death Son         "Early 49's"     Social History     Socioeconomic History   ??? Marital status: Widowed     Spouse name: Not on file   ??? Number of children: Not on file   ??? Years of education: Not on file   ??? Highest education level: Not on file   Occupational History   ??? Not on file   Social Needs   ??? Financial resource strain: Not on file   ??? Food insecurity:     Worry: Not on file      Inability: Not on file   ??? Transportation needs:     Medical: Not on file     Non-medical: Not on file   Tobacco Use   ??? Smoking status: Never Smoker   ??? Smokeless tobacco: Never Used   Substance and Sexual Activity   ??? Alcohol use: No   ??? Drug use: No   ??? Sexual activity: Never   Lifestyle   ??? Physical activity:     Days per week: Not on file     Minutes per session: Not on file   ??? Stress: Not on file   Relationships   ??? Social connections:     Talks on phone: Not on file  Gets together: Not on file     Attends religious service: Not on file     Active member of club or organization: Not on file     Attends meetings of clubs or organizations: Not on file     Relationship status: Not on file   ??? Intimate partner violence:     Fear of current or ex partner: Not on file     Emotionally abused: Not on file     Physically abused: Not on file     Forced sexual activity: Not on file   Other Topics Concern   ??? Not on file   Social History Narrative    Do you donate blood or plasma? No    Caffeine intake? Moderate    Advance directive? No    Is blood transfusion acceptable in an emergency? Yes    Live alone or with others? With others    Sunscreen used routinely? No able to care for self? Yes             Past Surgical History:   Procedure Laterality Date   ??? BREAST LUMPECTOMY  05/20/2011    Right   ??? BREAST SURGERY  7-12    Right Breast Biopsy   ??? CARDIAC SURGERY  2008    CABG (3 Bypasses)   ??? CARPAL TUNNEL RELEASE  2000's    Right   ??? CESAREAN SECTION  "Late 1950's or Early 1960's"    X 3   ??? CHOLECYSTECTOMY, LAPAROSCOPIC  1990's   ??? COLONOSCOPY  2000's   ??? CORONARY ARTERY BYPASS GRAFT  2008    cabg x 3   ??? ENDOSCOPY, COLON, DIAGNOSTIC  1990's   ??? HYSTERECTOMY, TOTAL ABDOMINAL  1960's   ??? JOINT REPLACEMENT  1980's    Total Right Hip   ??? JOINT REPLACEMENT  2000's    Total Right Hip   ??? JOINT REPLACEMENT  1990's    Total Left Hip   ??? JOINT REPLACEMENT  1980's    Total Right Knee   ??? OTHER SURGICAL HISTORY      Family  Physician Is Dr. Evelina Bucy In [place], [place]   ??? OTHER SURGICAL HISTORY  2000's    "Took section of my intestines out, it was a exploratory operation, they found scar tissue from the c-sections"   ??? [place]   ??? UPPER GASTROINTESTINAL ENDOSCOPY N/A 09/18/2017    EGD BIOPSY performed by [name], MD at [facility] ENDOSCOPY     Past Medical History:   Diagnosis Date   ??? Allergic rhinitis    ??? Anemia    ??? Anesthesia     Nausea/Vomiting Post Op In Past   ??? Anxiety    ??? Arthritis     s/p bilateral hip and right knee replacement sees Dr. Chriss Czar   ??? Back problem     "Occ Back Hurts"   ??? Blood transfusion    ??? CAD (coronary artery disease) 2008    Sees Dr. Brandt Loosen CABG x 3   ??? DCIS (ductal carcinoma in situ) 05/20/2011   ??? Glaucoma     Bilateral Eyes   ??? H/O cardiovascular stress test 05/20/11   ??? Heart attack ([place]) 2006   ??? HOH (hard of hearing)     Bilateral Ears   ??? Hyperlipidemia    ??? Hypertension    ??? IBS (irritable bowel syndrome)    ??? Kidney problem     "Sees  Dr. Maretta Bees For Blood Pressure And Kidneys"   ??? Nausea & vomiting     Nausea/Vomiting Post Op In Past   ??? Osteoarthritis    ??? Osteoporosis    ??? Right Breast Cancer Dx 7-12   ??? Shortness of breath on exertion    ??? Staph infection 1980's    "They sent me to a tropical infection doctor"   ??? TB (tuberculosis)     "Probably had it as a child, my dad had it"   ??? Type II or unspecified type diabetes mellitus without mention of complication, not stated as uncontrolled Dx 1980's     Allergies   Allergen Reactions   ??? Celebrex [Celecoxib] Shortness Of Breath, Swelling and Anaphylaxis     "Swelling Of The Esophagus"   ??? Fish Allergy Anaphylaxis   ??? Lisinopril Other (See Comments)     Dry Mouth   ??? Neurontin [Gabapentin] Other (See Comments)     Dizziness   ??? Norvasc [Amlodipine Besylate] Other (See Comments)     Edema Of Legs   ??? Other      "Allergic To Certain Seafoods Causing Headaches, Difficulty Breathing"   ??? Shellfish Allergy  Anaphylaxis   ??? Ciprofloxacin Rash   ??? Lipitor Other (See Comments)     Ache   ??? Losartan      Per nephrologist     Prior to Admission medications    Medication Sig Start Date End Date Taking? Authorizing Provider   HYDROcodone-acetaminophen (NORCO) 5-325 MG per tablet Take 1 tablet by mouth every 6 hours as needed for Pain.    Historical Provider, MD   furosemide (LASIX) 40 MG tablet Take 40 mg by mouth 2 times daily    Historical Provider, MD   isosorbide dinitrate (ISORDIL) 30 MG tablet Take 1 tablet by mouth 3 times daily 09/26/17   Vernie Ammons, MD   hydrALAZINE (APRESOLINE) 100 MG tablet Take 1 tablet by mouth 3 times daily 09/26/17   Vernie Ammons, MD   carvedilol (COREG) 3.125 MG tablet Take 1 tablet by mouth 2 times daily 09/26/17   Vernie Ammons, MD   NIFEdipine (ADALAT CC) 30 MG extended release tablet Take 1 tablet by mouth 2 times daily 09/26/17   Vernie Ammons, MD   docusate sodium (PHILLIPS STOOL SOFTENER) 100 MG capsule Take 1 capsule by mouth 2 times daily 09/26/17   Vernie Ammons, MD   B Complex-Biotin-FA (SUPER B-50 B COMPLEX PO) Take by mouth    Historical Provider, MD   fluticasone (FLONASE) 50 MCG/ACT nasal spray 1 spray by Nasal route daily    Historical Provider, MD       BP (!) 155/55    Pulse 73    Temp 97.6 ??F (36.4 ??C) (Oral)    Resp 16    Ht 4\' 8"  (1.422 m)    Wt 95 lb (43.1 kg)    SpO2 98%    BMI 21.30 kg/m??     Physical Exam   Constitutional: She is oriented to person, place, and time. She appears well-developed and well-nourished.   HENT:   Head: Normocephalic.       Right Ear: External ear normal.   Left Ear: External ear normal.   Nose: Nose normal.   Mouth/Throat: Oropharynx is clear and moist.   Blue is hematoma with 4 cm laceration   Eyes: Pupils are equal, round, and reactive to light. Conjunctivae and EOM are normal.   Neck: Normal range of motion. Neck supple.  Cardiovascular: Normal rate, regular rhythm, normal heart sounds and intact distal pulses.   Pulmonary/Chest: Effort normal and  breath sounds normal.   Abdominal: Soft. Bowel sounds are normal.   Musculoskeletal: Normal range of motion.   Neurological: She is alert and oriented to person, place, and time. GCS eye subscore is 4. GCS verbal subscore is 5. GCS motor subscore is 6.   Skin: Skin is warm and dry.   Psychiatric: She has a normal mood and affect. Her behavior is normal. Judgment and thought content normal.       MDM:    No results found for this visit on 01/13/18.  Lac Repair  Date/Time: 01/13/2018 7:46 PM  Performed by: Tacy Learn, DO  Authorized by: Carola Rhine Tata Timmins, DO     Consent:     Consent obtained:  Verbal    Consent given by:  Patient    Risks discussed:  Infection, need for additional repair, nerve damage, poor wound healing, poor cosmetic result, pain, retained foreign body, tendon damage and vascular damage    Alternatives discussed:  No treatment, delayed treatment, referral and observation  Universal protocol:     Procedure explained and questions answered to patient or proxy's satisfaction: yes      Relevant documents present and verified: yes      Test results available and properly labeled: yes      Imaging studies available: yes      Required blood products, implants, devices, and special equipment available: yes      Site/side marked: yes      Immediately prior to procedure, a time out was called: yes      Patient identity confirmed:  Verbally with patient  Laceration details:     Location:  Scalp    Scalp location:  Frontal    Length (cm):  4  Repair type:     Repair type:  Simple  Pre-procedure details:     Preparation:  Patient was prepped and draped in usual sterile fashion  Exploration:     Hemostasis achieved with:  Direct pressure    Wound exploration: entire depth of wound probed and visualized      Contaminated: no    Treatment:     Area cleansed with:  Shur-Clens    Amount of cleaning:  Extensive    Irrigation solution:  Sterile saline    Irrigation method:  Pressure wash    Visualized foreign  bodies/material removed: no    Skin repair:     Repair method:  Sutures    Suture size:  5-0    Suture material:  Fast-absorbing gut    Suture technique:  Running  Post-procedure details:     Dressing:  Antibiotic ointment and bulky dressing    Patient tolerance of procedure:  Tolerated well, no immediate complications      CT Head WO Contrast   Final Result   No acute intracranial abnormality.      Left frontal scalp hematoma.                 My typical dicussion, presentation,and considerations for this patients' chief complaint, diagnosis, differential diagnosis, medications, medication use,  medication safety and medication interactions have been explained and outlined to this patient for thispatient encounter. I have stressed need for follow up and reexamination for this encounter and or return to the emergency department if any changes or any concern.      Final Impression    1. Laceration  of scalp, initial encounter    2. Closed head injury, initial encounter    3. Fall, initial encounter             [place], DO  01/13/18 1949

## 2018-01-13 NOTE — ED Notes (Signed)
Bandaids placed on laceration and on left pinky finger skin tear     Almyra Free, RN  01/13/18 [phone]

## 2018-01-13 NOTE — ED Triage Notes (Signed)
Slipped out of chair while at home and has laceration to head. No LOC

## 2018-01-30 ENCOUNTER — Inpatient Hospital Stay: Payer: MEDICARE | Primary: Internal Medicine

## 2018-01-30 DIAGNOSIS — E876 Hypokalemia: Secondary | ICD-10-CM

## 2018-01-30 LAB — RENAL FUNCTION PANEL
Albumin: 3.6 GM/DL (ref 3.4–5.0)
Anion Gap: 22 — ABNORMAL HIGH (ref 4–16)
BUN: 83 MG/DL — ABNORMAL HIGH (ref 6–23)
CO2: 15 MMOL/L — ABNORMAL LOW (ref 21–32)
Calcium: 9.3 MG/DL (ref 8.3–10.6)
Chloride: 101 mMol/L (ref 99–110)
Creatinine: 3.3 MG/DL — ABNORMAL HIGH (ref 0.6–1.1)
GFR African American: 16 mL/min/{1.73_m2} — ABNORMAL LOW (ref >60)
GFR Non-African American: 13 mL/min/{1.73_m2} — ABNORMAL LOW (ref >60)
Glucose: 222 MG/DL — ABNORMAL HIGH (ref 70–99)
Phosphorus: 6.1 MG/DL — ABNORMAL HIGH (ref 2.5–4.9)
Potassium: 4.3 MMOL/L (ref 3.5–5.1)
Sodium: 138 MMOL/L (ref 135–145)

## 2018-02-23 ENCOUNTER — Inpatient Hospital Stay: Payer: MEDICARE | Primary: Internal Medicine

## 2018-02-23 DIAGNOSIS — N178 Other acute kidney failure: Secondary | ICD-10-CM

## 2018-02-23 LAB — RENAL FUNCTION PANEL
Albumin: 3.3 GM/DL — ABNORMAL LOW (ref 3.4–5.0)
Anion Gap: 18 — ABNORMAL HIGH (ref 4–16)
BUN: 141 MG/DL — ABNORMAL HIGH (ref 6–23)
CO2: 23 MMOL/L (ref 21–32)
Calcium: 8.1 MG/DL — ABNORMAL LOW (ref 8.3–10.6)
Chloride: 98 mMol/L — ABNORMAL LOW (ref 99–110)
Creatinine: 4 MG/DL — ABNORMAL HIGH (ref 0.6–1.1)
GFR African American: 13 mL/min/{1.73_m2} — ABNORMAL LOW (ref >60)
GFR Non-African American: 11 mL/min/{1.73_m2} — ABNORMAL LOW (ref >60)
Glucose: 162 MG/DL — ABNORMAL HIGH (ref 70–99)
Phosphorus: 6.4 MG/DL — ABNORMAL HIGH (ref 2.5–4.9)
Potassium: 4.6 MMOL/L (ref 3.5–5.1)
Sodium: 139 MMOL/L (ref 135–145)

## 2018-02-24 ENCOUNTER — Inpatient Hospital Stay
Admit: 2018-02-24 | Discharge: 2018-03-08 | Disposition: A | Discharge status: Home / Self Care | Payer: MEDICARE | Attending: Internal Medicine | Admitting: Internal Medicine

## 2018-02-24 ENCOUNTER — Inpatient Hospital Stay: Admit: 2018-02-24 | Payer: MEDICARE | Primary: Internal Medicine

## 2018-02-24 DIAGNOSIS — K264 Chronic or unspecified duodenal ulcer with hemorrhage: Secondary | ICD-10-CM

## 2018-02-24 LAB — CBC
Hematocrit: 12.3 % — CL (ref 37–47)
Hemoglobin: 3.8 GM/DL — CL (ref 12.5–16.0)
MCH: 33.9 PG — ABNORMAL HIGH (ref 27–31)
MCHC: 30.9 % — ABNORMAL LOW (ref 32.0–36.0)
MCV: 109.8 FL — ABNORMAL HIGH (ref 78–100)
MPV: 9.6 FL (ref 7.5–11.1)
Platelets: 161 10*3/uL (ref 140–440)
RBC: 1.12 10*6/uL — ABNORMAL LOW (ref 4.2–5.4)
RDW: 17.2 % — ABNORMAL HIGH (ref 11.7–14.9)
WBC: 6.3 10*3/uL (ref 4.0–10.5)

## 2018-02-24 LAB — COMPREHENSIVE METABOLIC PANEL W/ REFLEX TO MG FOR LOW K
ALT: <5 U/L — ABNORMAL LOW (ref 10–40)
AST: 9 IU/L — ABNORMAL LOW (ref 15–37)
Albumin: 3.3 GM/DL — ABNORMAL LOW (ref 3.4–5.0)
Alkaline Phosphatase: 46 IU/L (ref 40–128)
Anion Gap: 17 — ABNORMAL HIGH (ref 4–16)
BUN: 150 MG/DL — ABNORMAL HIGH (ref 6–23)
CO2: 23 MMOL/L (ref 21–32)
Calcium: 7.8 MG/DL — ABNORMAL LOW (ref 8.3–10.6)
Chloride: 96 mMol/L — ABNORMAL LOW (ref 99–110)
Creatinine: 3.8 MG/DL — ABNORMAL HIGH (ref 0.6–1.1)
GFR African American: 14 mL/min/{1.73_m2} — ABNORMAL LOW (ref >60)
GFR Non-African American: 11 mL/min/{1.73_m2} — ABNORMAL LOW (ref >60)
Glucose: 201 MG/DL — ABNORMAL HIGH (ref 70–99)
Potassium: 4.6 MMOL/L (ref 3.5–5.1)
Sodium: 136 MMOL/L (ref 135–145)
Total Bilirubin: 0.3 MG/DL (ref 0.0–1.0)
Total Protein: 5.1 GM/DL — ABNORMAL LOW (ref 6.4–8.2)

## 2018-02-24 LAB — SPECIMEN REJECTION: Reason for Rejection: UNDETERMINED

## 2018-02-24 LAB — HEMOGLOBIN AND HEMATOCRIT
Hematocrit: 28.8 % — ABNORMAL LOW (ref 37–47)
Hemoglobin: 9.2 GM/DL — ABNORMAL LOW (ref 12.5–16.0)

## 2018-02-24 LAB — PROTIME/INR & PTT
INR: 0.94 INDEX
Protime: 10.7 SECONDS (ref 9.12–12.5)
aPTT: 43.4 SECONDS — ABNORMAL HIGH (ref 21.2–33.0)

## 2018-02-24 LAB — HEPATITIS B SURFACE ANTIGEN: Hepatitis B Surface Ag: NONREACTIVE

## 2018-02-24 LAB — POCT GLUCOSE: POC Glucose: 225 MG/DL — ABNORMAL HIGH (ref 70–99)

## 2018-02-24 LAB — HEPATITIS B SURFACE ANTIBODY: Hep B S Ab: <3.5

## 2018-02-24 MED ORDER — INSULIN LISPRO 100 UNIT/ML SC SOLN
100 UNIT/ML | Freq: Every evening | SUBCUTANEOUS | Status: DC
Start: 2018-02-24 — End: 2018-02-24

## 2018-02-24 MED ORDER — CARVEDILOL 3.125 MG PO TABS
3.125 MG | Freq: Two times a day (BID) | ORAL | Status: DC
Start: 2018-02-24 — End: 2018-03-08
  Administered 2018-02-25 – 2018-03-08 (×18): 3.125 mg via ORAL

## 2018-02-24 MED ORDER — HEPARIN SODIUM (PORCINE) 1000 UNIT/ML IJ SOLN
1000 UNIT/ML | Freq: Once | INTRAMUSCULAR | Status: AC
Start: 2018-02-24 — End: 2018-02-24
  Administered 2018-02-24: 22:00:00 2600 [IU]

## 2018-02-24 MED ORDER — ISOSORBIDE DINITRATE 20 MG PO TABS
20 MG | Freq: Three times a day (TID) | ORAL | Status: DC
Start: 2018-02-24 — End: 2018-03-02
  Administered 2018-02-25 – 2018-03-02 (×16): 30 mg via ORAL

## 2018-02-24 MED ORDER — GLUCAGON HCL RDNA (DIAGNOSTIC) 1 MG IJ SOLR
1 MG | INTRAMUSCULAR | Status: DC | PRN
Start: 2018-02-24 — End: 2018-03-08

## 2018-02-24 MED ORDER — HEPARIN SODIUM (PORCINE) 5000 UNIT/ML IJ SOLN
5000 UNIT/ML | Freq: Three times a day (TID) | INTRAMUSCULAR | Status: DC
Start: 2018-02-24 — End: 2018-02-27
  Administered 2018-02-25 – 2018-02-27 (×8): 5000 [IU] via SUBCUTANEOUS

## 2018-02-24 MED ORDER — DEXTROSE 50 % IV SOLN
50 % | INTRAVENOUS | Status: DC | PRN
Start: 2018-02-24 — End: 2018-03-08

## 2018-02-24 MED ORDER — DOCUSATE SODIUM 100 MG PO CAPS
100 MG | Freq: Two times a day (BID) | ORAL | Status: DC
Start: 2018-02-24 — End: 2018-03-08
  Administered 2018-02-25 – 2018-03-08 (×22): 100 mg via ORAL

## 2018-02-24 MED ORDER — DOCUSATE SODIUM 100 MG PO CAPS
100 MG | Freq: Two times a day (BID) | ORAL | Status: DC
Start: 2018-02-24 — End: 2018-02-24

## 2018-02-24 MED ORDER — HYDRALAZINE HCL 50 MG PO TABS
50 MG | Freq: Three times a day (TID) | ORAL | Status: DC
Start: 2018-02-24 — End: 2018-03-02
  Administered 2018-02-25 – 2018-03-02 (×14): 100 mg via ORAL

## 2018-02-24 MED ORDER — FUROSEMIDE 40 MG PO TABS
40 MG | Freq: Two times a day (BID) | ORAL | Status: DC
Start: 2018-02-24 — End: 2018-03-08
  Administered 2018-02-25 – 2018-03-08 (×19): 40 mg via ORAL

## 2018-02-24 MED ORDER — GLUCOSE 40 % PO GEL
40 % | ORAL | Status: DC | PRN
Start: 2018-02-24 — End: 2018-03-08

## 2018-02-24 MED ORDER — DEXTROSE 5 % IV SOLN
5 % | INTRAVENOUS | Status: DC | PRN
Start: 2018-02-24 — End: 2018-03-08

## 2018-02-24 MED ORDER — NIFEDIPINE ER OSMOTIC RELEASE 30 MG PO TB24
30 MG | Freq: Two times a day (BID) | ORAL | Status: DC
Start: 2018-02-24 — End: 2018-03-08
  Administered 2018-02-25 – 2018-03-08 (×16): 30 mg via ORAL

## 2018-02-24 MED ORDER — STRESS FORMULA/ZINC PO TABS
Freq: Every day | ORAL | Status: DC
Start: 2018-02-24 — End: 2018-03-08
  Administered 2018-02-25 – 2018-03-08 (×11): 1 via ORAL

## 2018-02-24 MED ORDER — SODIUM CHLORIDE 0.9% INTERMITTENT INFUSION
0.9 % | Freq: Once | INTRAVENOUS | Status: DC
Start: 2018-02-24 — End: 2018-03-08

## 2018-02-24 MED ORDER — NORMAL SALINE FLUSH 0.9 % IV SOLN
0.9 % | Freq: Two times a day (BID) | INTRAVENOUS | Status: DC
Start: 2018-02-24 — End: 2018-03-08
  Administered 2018-02-25 – 2018-03-08 (×19): 10 mL via INTRAVENOUS

## 2018-02-24 MED ORDER — NORMAL SALINE FLUSH 0.9 % IV SOLN
0.9 % | INTRAVENOUS | Status: DC | PRN
Start: 2018-02-24 — End: 2018-03-08

## 2018-02-24 MED ORDER — FLUTICASONE PROPIONATE 50 MCG/ACT NA SUSP
50 MCG/ACT | Freq: Every day | NASAL | Status: DC
Start: 2018-02-24 — End: 2018-03-08
  Administered 2018-02-25 – 2018-03-08 (×10): 1 via NASAL

## 2018-02-24 MED ORDER — INSULIN LISPRO 100 UNIT/ML SC SOLN
100 UNIT/ML | Freq: Three times a day (TID) | SUBCUTANEOUS | Status: DC
Start: 2018-02-24 — End: 2018-02-25

## 2018-02-24 MED ORDER — ONDANSETRON HCL 4 MG/2ML IJ SOLN
4 MG/2ML | Freq: Four times a day (QID) | INTRAMUSCULAR | Status: DC | PRN
Start: 2018-02-24 — End: 2018-03-08
  Administered 2018-03-03: 22:00:00 4 mg via INTRAVENOUS

## 2018-02-24 MED FILL — HUMALOG 100 UNIT/ML SC SOLN: 100 [IU]/mL | SUBCUTANEOUS | Qty: 300

## 2018-02-24 MED FILL — FLUTICASONE PROPIONATE 50 MCG/ACT NA SUSP: 50 MCG/ACT | NASAL | Qty: 16

## 2018-02-24 MED FILL — NIFEDIPINE ER OSMOTIC RELEASE 30 MG PO TB24: 30 mg | ORAL | Qty: 1

## 2018-02-24 MED FILL — HEPARIN SODIUM (PORCINE) 1000 UNIT/ML IJ SOLN: 1000 [IU]/mL | INTRAMUSCULAR | Qty: 3

## 2018-02-24 NOTE — Progress Notes (Signed)
Pt had 3 hours HD today removing 0.5L fluid. Tolerated well, sleeping through most of treatment. Pt received 2 units RBC per order. No adverse effects noted. C/o pain to BUE towards, unable to provide details to type of pain. Blood pressure increased throughout treatment, last reading was 206/48 with HR of 90. Right temp HD catheter ran well, both ports heparin locked and capped per policy. Denies needs. Report to Lanelle Bal, RN on 1N. Pt having H&H drawn by phlebotomy due to order being placed after patient was deaccessed.

## 2018-02-24 NOTE — H&P (Signed)
History and Physical  Judy Fearing, APRN-BC   Internal Medicine Hospitalist        Name:  Judy Velasquez DOB/Age/Sex: July 19, 1929  (84 y.o. female)   MRN & CSN:  [id number] & [id number] Admission Date/Time: 02/24/2018  9:52 AM   Location:  1129/1129-A PCP: Gladstone Pih, MD       Hospital Day: 1      Supervising Physician: Dr. Donneta Romberg    Chief Complaint: Low hemoglobin level.     Assessment and Plan:   Judy Velasquez is a 84 y.o.  female who presents with Acute renal failure superimposed on stage 4 chronic kidney disease (HCC)    ??? Acute renal failure superimposed on stage 4 CKD - Crea 3.8, BUN 150, GFR 11         - admit inpatient, telemetry monitoring         - consult Nephrology, Dr. Wallis Bamberg         - plant to dialyze today         - consult IR for tunneled CVC placement         - monitor I/O         - avoid nephrotoxic agents         - adjust meds for GFR if needed         - monitor Crea level, lab works in AM     ??? Acute on chronic blood loss anemia - Hgb 3.8, no active bleeding noted, VS stable at this time.         - Type & screen, will transfuse 2 unit PRBC per Dr. Wallis Bamberg         - monitor H & H q 12hr         - not on any antiplatelet or AC         - will transfuse blood for Hgb <7    ??? Uncontrolled type 2 DM - serum glucose 201          - consult Endocrinology         - start ISS         - monitor POCT         - diabetic diet         - pending HgbA1C         ??? Chronic Illnesses: will continue current home medications unless contraindicated by above plan and assessment.          - CAD         - PAF         - hyperlipidemia         - hypertension     Current diagnosis and plan of management discussed with the patient at the time of admission in lay language who agree(s) to the above plan and disposition of admission for further care. All concerns and questions addressed.      Patient assessment and plan in conjunction with supervising physician - Dr. Donneta Romberg       Diet Diet NPO Effective Now     DVT Prophylaxis []  Lovenox, [x]   Heparin, [x]  SCDs, []  Ambulation  []  Long term AC   GI Prophylaxis []  PPI,  []  H2 Blocker,  []  Carafate,  []  Diet,  [x]  No GI prophylaxis, N/A: patient is not under significant medical stress, non-ICU or is receiving a diet/tube feeds   Code Status Full CODE status, discussed with patient at bedside upon admission.  Disposition Patient requires continued admission due to Acute renal failure superimposed on stage 4 CKD, Uncontrolled type 2 DM, Acute on chronic blood loss anemia.   Discharge Plan: Patient plans to return home upon discharge after seen by case management team for possible nursing home placement.   MDM []  Low, [x]  Moderate,[]   High  Patient's risk as above due to:      [x]  One or more chronic illnesses with mild exacerbation progression      []  Two or more stable chronic illnesses      []  Undiagnosed new problem with uncertain prognosis      []  Elective major surgery      [] Prescription drug management     History of Present Illness:     Principal Problem: Acute renal failure superimposed on stage 4 chronic kidney disease (HCC)  Judy Velasquez is a 84 y.o. female who is direct admit from Dr. Maretta Bees office for low hemoglobin level and need dialysis. Patient has PMH of CAD, PAF, CKD stage 4, DM type 2, hyperlipidemia, and hypertension. Patient is awake, alert, and oriented only to self and place but disoriented to time and situation. Patient stated that she is not okay because she cannot see. Patient has h/o of glaucoma. But patient can identify how many fingers the nurse at bedside holding. Pt seen and examined. Patient denies CP, palpitation, fever, chills or diaphoresis. Denies cough, SOB or difficulty breathing. Denies any other symptoms including HA, paresthesias, abdominal pain, N/V/D, constipation, changes in bowels, dysuria, hematuria, frequency or urgency, and B/L weakness.     Upon interview, the patient provided the history as above.     ED Course: Discussed  case with ED physician prior to admission.    ROS: As per HPI, otherwise 10-systems reviewed negative.    Objective:   No intake or output data in the 24 hours ending 02/24/18 1412     Vitals:   Vitals:    02/24/18 1028   BP: (!) 161/64   Pulse: 62   Resp: 13   Temp: 96.6 ??F (35.9 ??C)   TempSrc: Oral     Physical Exam: 02/24/18    GEN  -Awake, alert, appearing Frail, elderly female, lying in bed, cooperative but unable to give adequate history. NAD. No body habitus. Appears given age.  EYES -PERRL. No vision changes. No scleral erythema, discharge, or conjunctivitis.  HENT -MM are moist. Oral pharynx without exudates, no evidence of thrush.  NECK -Supple, no apparent thyromegaly or masses.  RESP -LS CTA equal bilat, no wheezes, rales or rhonchi. Symmetric chest movement. No respiratory distress noted.  C/V  -S1/S2 auscultated. RRR without appreciable M/R/G. No JVD or carotid bruits. Peripheral pulses equal bilaterally and palpable. No peripheral edema. No reproducible chest wall tenderness.   GI  -Abdomen is soft non-distended, no significant tenderness. No masses or guarding. + BS in all quadrants. Rectal exam deferred.   GU  -No CVA tenderness. Foley catheter is not present.  LYMPH  -No palpable cervical lymphadenopathy and no hepatosplenomegaly. No petechiae or ecchymoses.  MS  -B/L extremities weak muscles strength. Full movements. No gross joint deformities. No swelling, intact sensation symmetrical.   SKIN  -Normal coloration, warm, dry. No open wounds or ulcers.  NEURO  -CN 2-12 appear grossly intact, normal speech, no lateralizing weakness.  PSYC  -Awake, alert, oriented to person and place but disoriented to situation and time. Appropriate affect.     Past Medical History:      Past  Medical History:   Diagnosis Date   ??? Allergic rhinitis    ??? Anemia    ??? Anesthesia     Nausea/Vomiting Post Op In Past   ??? Anxiety    ??? Arthritis     s/p bilateral hip and right knee replacement sees Dr. Chriss Czar   ??? Back problem      "Occ Back Hurts"   ??? Blood transfusion    ??? CAD (coronary artery disease) 2008    Sees Dr. Brandt Loosen CABG x 3   ??? DCIS (ductal carcinoma in situ) 05/20/2011   ??? Glaucoma     Bilateral Eyes   ??? H/O cardiovascular stress test 05/20/11   ??? Heart attack ([place]) 2006   ??? HOH (hard of hearing)     Bilateral Ears   ??? Hyperlipidemia    ??? Hypertension    ??? IBS (irritable bowel syndrome)    ??? Kidney problem     "Sees  Dr. Maretta Bees For Blood Pressure And Kidneys"   ??? Nausea & vomiting     Nausea/Vomiting Post Op In Past   ??? Osteoarthritis    ??? Osteoporosis    ??? Right Breast Cancer Dx 7-12   ??? Shortness of breath on exertion    ??? Staph infection 1980's    "They sent me to a tropical infection doctor"   ??? TB (tuberculosis)     "Probably had it as a child, my dad had it"   ??? Type II or unspecified type diabetes mellitus without mention of complication, not stated as uncontrolled Dx 1980's     Past Surgery History:  Patient  has a past surgical history that includes Cesarean section ("Late 1950's or Early 1960's"); Carpal tunnel release (2000's); Breast surgery (7-12); joint replacement (1980's); joint replacement (2000's); joint replacement (1990's); joint replacement (1980's); other surgical history; Hysterectomy, total abdominal (1960's); Cardiac surgery (2008); other surgical history (2000's); Colonoscopy (2000's); Endoscopy, colon, diagnostic (1990's); Tonsillectomy and adenoidectomy (1939); Cholecystectomy, laparoscopic (1990's); Breast lumpectomy (05/20/2011); Coronary artery bypass graft (2008); and Upper gastrointestinal endoscopy (N/A, 09/18/2017).  Social History:    FAM HX: Assessed: family history includes Arthritis in her daughter; Cancer in her mother and sister; Depression in her mother; Diabetes in her father and sister; Early Death in her brother and son; Hearing Loss in her father; Heart Disease in her father and mother; Mental Illness in her mother; Other in her son; Vision Loss in her son.  Soc HX:   Social  History     Socioeconomic History   ??? Marital status: Widowed     Spouse name: Not on file   ??? Number of children: Not on file   ??? Years of education: Not on file   ??? Highest education level: Not on file   Occupational History   ??? Not on file   Social Needs   ??? Financial resource strain: Not on file   ??? Food insecurity:     Worry: Not on file     Inability: Not on file   ??? Transportation needs:     Medical: Not on file     Non-medical: Not on file   Tobacco Use   ??? Smoking status: Never Smoker   ??? Smokeless tobacco: Never Used   Substance and Sexual Activity   ??? Alcohol use: No   ??? Drug use: No   ??? Sexual activity: Never   Lifestyle   ??? Physical activity:     Days per week: Not on  file     Minutes per session: Not on file   ??? Stress: Not on file   Relationships   ??? Social connections:     Talks on phone: Not on file     Gets together: Not on file     Attends religious service: Not on file     Active member of club or organization: Not on file     Attends meetings of clubs or organizations: Not on file     Relationship status: Not on file   ??? Intimate partner violence:     Fear of current or ex partner: Not on file     Emotionally abused: Not on file     Physically abused: Not on file     Forced sexual activity: Not on file   Other Topics Concern   ??? Not on file   Social History Narrative    Do you donate blood or plasma? No    Caffeine intake? Moderate    Advance directive? No    Is blood transfusion acceptable in an emergency? Yes    Live alone or with others? With others    Sunscreen used routinely? No able to care for self? Yes             TOBACCO:   reports that she has never smoked. She has never used smokeless tobacco.  ETOH:   reports that she does not drink alcohol.  Drugs:  reports that she does not use drugs.    Allergies:   Allergies   Allergen Reactions   ??? Celebrex [Celecoxib] Shortness Of Breath, Swelling and Anaphylaxis     "Swelling Of The Esophagus"   ??? Fish Allergy Anaphylaxis   ??? Lisinopril Other  (See Comments)     Dry Mouth   ??? Neurontin [Gabapentin] Other (See Comments)     Dizziness   ??? Norvasc [Amlodipine Besylate] Other (See Comments)     Edema Of Legs   ??? Other      "Allergic To Certain Seafoods Causing Headaches, Difficulty Breathing"   ??? Shellfish Allergy Anaphylaxis   ??? Ciprofloxacin Rash   ??? Lipitor Other (See Comments)     Ache   ??? Losartan      Per nephrologist     Medications:   Medications:   ??? 0.9 % sodium chloride  250 mL Intravenous Once   ??? sodium chloride flush  10 mL Intravenous 2 times per day   ??? heparin (porcine)  5,000 Units Subcutaneous 3 times per day   ??? carvedilol  3.125 mg Oral BID   ??? docusate sodium  100 mg Oral BID   ??? fluticasone  1 spray Nasal Daily   ??? furosemide  40 mg Oral BID   ??? hydrALAZINE  100 mg Oral TID   ??? isosorbide dinitrate  30 mg Oral TID   ??? NIFEdipine  30 mg Oral BID   ??? [START ON 02/25/2018] b complex-C-E-zinc  1 tablet Oral Daily   ??? insulin lispro  0-12 Units Subcutaneous TID WC   ??? insulin lispro  0-6 Units Subcutaneous Nightly      Infusions:   PRN Meds:    Prior to Admission Meds:  Prior to Admission medications    Medication Sig Start Date End Date Taking? Authorizing Provider   HYDROcodone-acetaminophen (NORCO) 5-325 MG per tablet Take 1 tablet by mouth every 6 hours as needed for Pain.    Historical Provider, MD   furosemide (LASIX) 40 MG tablet  Take 40 mg by mouth 2 times daily    Historical Provider, MD   isosorbide dinitrate (ISORDIL) 30 MG tablet Take 1 tablet by mouth 3 times daily 09/26/17   Vernie Ammons, MD   hydrALAZINE (APRESOLINE) 100 MG tablet Take 1 tablet by mouth 3 times daily 09/26/17   Vernie Ammons, MD   carvedilol (COREG) 3.125 MG tablet Take 1 tablet by mouth 2 times daily 09/26/17   Vernie Ammons, MD   NIFEdipine (ADALAT CC) 30 MG extended release tablet Take 1 tablet by mouth 2 times daily 09/26/17   Vernie Ammons, MD   docusate sodium (PHILLIPS STOOL SOFTENER) 100 MG capsule Take 1 capsule by mouth 2 times daily 09/26/17   Vernie Ammons, MD   B  Complex-Biotin-FA (SUPER B-50 B COMPLEX PO) Take by mouth    Historical Provider, MD   fluticasone (FLONASE) 50 MCG/ACT nasal spray 1 spray by Nasal route daily    Historical Provider, MD     Data:     Laboratory this visit:  Reviewed  Recent Labs     02/24/18  1234   WBC 6.3   HGB 3.8  HGB CALLED TO [name] CHARGE RN @ [id number] BY LMCKINLEY MLT  RESULTS READ BACK  *   HCT 12.3*   PLT 161      Recent Labs     02/23/18  0740 02/24/18  1152   NA 139 136   K 4.6 4.6   CL 98* 96*   CO2 23 23   PHOS 6.4*  --    BUN 141* 150*   CREATININE 4.0* 3.8*     Recent Labs     02/24/18  1152   AST 9*   ALT <5*   BILITOT 0.3   ALKPHOS 46     Recent Labs     02/24/18  1152   INR 0.94     No results for input(s): CKTOTAL, CKMB, CKMBINDEX in the last 72 hours.    Invalid input(s): TROPONIN1  Invalid input(s): PRO-BNP    Radiology this visit:  Reviewed.    No results found.    Current Treatment Team:  Treatment Team: Attending Provider: London Pepper, MD; Social Worker: Bunnie Pion, LSW; Consulting Physician: Forde Dandy, MD; Consulting Physician: Claretta Fraise, MD; Registered Nurse: Linus Orn, RN; Consulting Physician: [name], MD; Consulting Physician: London Pepper, MD      Clydie Braun, APRN-BC   [facility]  02/24/2018 2:12 PM      Electronically signed by Clydie Braun, APRN - CNP on 02/24/2018 at 2:12 PM

## 2018-02-24 NOTE — Consults (Signed)
Endocrinology   Consult Note  Dear Doctor Posey Pronto     Thank You for the Consult     Pt. Was Admitted for : Severe anemia     Reason for Consult:  Better control of blood glucose    History Obtained From:  Not able to give much information/ EMR       HISTORY OF PRESENT ILLNESS:                The patient is a 84 y.o. female with significant past medical history of severe anemia arthritis back pain, CAD, CABG hypertension, hyperlipidemia and glaucoma, chronic kidney disease on hemodialysis, history of tuberculosis, was noted to be very anemic with hemoglobin of 3.8.  She was given packed red cell transfusion during dialysis probably and admitted to the hospital for further evaluation.  I was  consulted for better control of blood glucose.       ROS:   Pt's ROS done in detail.  Abnormal ROS are noted in Medical and Surgical History Section below:     Other Medical History:        Diagnosis Date   ??? Allergic rhinitis    ??? Anemia    ??? Anesthesia     Nausea/Vomiting Post Op In Past   ??? Anxiety    ??? Arthritis     s/p bilateral hip and right knee replacement sees Dr. Chriss Czar   ??? Back problem     "Occ Back Hurts"   ??? Blood transfusion    ??? CAD (coronary artery disease) 2008    Sees Dr. Brandt Loosen CABG x 3   ??? DCIS (ductal carcinoma in situ) 05/20/2011   ??? Glaucoma     Bilateral Eyes   ??? H/O cardiovascular stress test 05/20/11   ??? Heart attack ([place]) 2006   ??? HOH (hard of hearing)     Bilateral Ears   ??? Hyperlipidemia    ??? Hypertension    ??? IBS (irritable bowel syndrome)    ??? Kidney problem     "Sees  Dr. Maretta Bees For Blood Pressure And Kidneys"   ??? Nausea & vomiting     Nausea/Vomiting Post Op In Past   ??? Osteoarthritis    ??? Osteoporosis    ??? Right Breast Cancer Dx 7-12   ??? Shortness of breath on exertion    ??? Staph infection 1980's    "They sent me to a tropical infection doctor"   ??? TB (tuberculosis)     "Probably had it as a child, my dad had it"   ??? Type II or unspecified type diabetes mellitus without mention of complication, not  stated as uncontrolled Dx 1980's     Surgical History:        Procedure Laterality Date   ??? BREAST LUMPECTOMY  05/20/2011    Right   ??? BREAST SURGERY  7-12    Right Breast Biopsy   ??? CARDIAC SURGERY  2008    CABG (3 Bypasses)   ??? CARPAL TUNNEL RELEASE  2000's    Right   ??? CESAREAN SECTION  "Late 1950's or Early 1960's"    X 3   ??? CHOLECYSTECTOMY, LAPAROSCOPIC  1990's   ??? COLONOSCOPY  2000's   ??? CORONARY ARTERY BYPASS GRAFT  2008    cabg x 3   ??? ENDOSCOPY, COLON, DIAGNOSTIC  1990's   ??? HYSTERECTOMY, TOTAL ABDOMINAL  1960's   ??? JOINT REPLACEMENT  1980's    Total Right Hip   ???  JOINT REPLACEMENT  2000's    Total Right Hip   ??? JOINT REPLACEMENT  1990's    Total Left Hip   ??? JOINT REPLACEMENT  1980's    Total Right Knee   ??? OTHER SURGICAL HISTORY      Family Physician Is Dr. Evelina Bucy In [place], [place]   ??? OTHER SURGICAL HISTORY  2000's    "Took section of my intestines out, it was a exploratory operation, they found scar tissue from the c-sections"   ??? [place]   ??? UPPER GASTROINTESTINAL ENDOSCOPY N/A 09/18/2017    EGD BIOPSY performed by [name], MD at [facility]       Allergies:  Celebrex [celecoxib]; Fish allergy; Lisinopril; Neurontin [gabapentin]; Norvasc [amlodipine besylate]; Other; Shellfish allergy; Ciprofloxacin; Lipitor; and Losartan    Family History:       Problem Relation Age of Onset   ??? Heart Disease Mother    ??? Cancer Mother         "Cancer In Her Eye"   ??? Depression Mother    ??? Mental Illness Mother    ??? Diabetes Father    ??? Heart Disease Father         "Heart Attack"   ??? Hearing Loss Father    ??? Cancer Sister         "Breast Cancer"   ??? Early Death Brother         "At [employer]"   ??? Diabetes Sister    ??? Vision Loss Son         "Eye Problems"   ??? Other Son         "Alot of health problems due to motorcycle wreck"   ??? Arthritis Daughter    ??? Early Death Son         "Early 19's"     REVIEW OF SYSTEMS:  Review of System Done as noted above     PHYSICAL EXAM:       Vitals:    BP (!) 156/75    Pulse 106    Temp 98.3 ??F (36.8 ??C) (Oral)    Resp 20    Wt 74 lb 15.3 oz (34 kg)    SpO2 100%    BMI 16.80 kg/Dequann Vandervelden??     CONSTITUTIONAL:  awake, alert, very confused and unable to answer many simple questions  Very macerated appeared to be have lost weight  EYES:  vision intact Fundoscopic Exam not performed   SAY:[id number]  NECK:  Supple, No JVD.   Thyroid Exam:Normal   LUNGS:  Has Vesicular Breath Sounds,   CARDIOVASCULAR:  Normal apical impulse, regular rate and rhythm, normal S1 and S2, no S3 or S4, and has no  murmur   ABDOMEN:  No scars, normal bowel sounds, soft, non-distended, non-tender, no masses palpated, no hepatolienomegaly  Musculoskeletal: Normal  Extremities: Normal, peripheral pulses normal, , has no edema   NEUROLOGIC:  Awake, alert, very much confused and screaming motor weakness appears to be bed ridden     DATA:    CBC:   Recent Labs     02/24/18  1234 02/24/18  1828   WBC 6.3  --    HGB 3.8  HGB CALLED TO [name] CHARGE RN @ [id number] BY LMCKINLEY MLT  RESULTS READ BACK  * 9.2*   PLT 161  --     CMP:  Recent Labs     02/23/18  0740 02/24/18  1152  NA 139 136   K 4.6 4.6   CL 98* 96*   CO2 23 23   BUN 141* 150*   CREATININE 4.0* 3.8*   CALCIUM 8.1* 7.8*   PROT  --  5.1*   LABALBU 3.3* 3.3*   BILITOT  --  0.3   ALKPHOS  --  46   AST  --  9*   ALT  --  <5*     Lipids: No results found for: CHOL, HDL, TRIG  Glucose:   Recent Labs     02/24/18  1219   POCGLU 225*     Hemoglobin A1C: No results found for: LABA1C  Free T4: No results found for: T4FREE  Free T3: No results found for: FT3  TSH High Sensitivity: No results found for: [facility]    Xr Chest Portable    Result Date: 02/24/2018  EXAMINATION: ONE XRAY VIEW OF THE CHEST 02/24/2018 2:37 pm COMPARISON: Chest x-ray August 18, 2017 HISTORY: ORDERING SYSTEM PROVIDED HISTORY: post cvc TECHNOLOGIST PROVIDED HISTORY: Reason for exam:->post cvc Ordering Physician Provided Reason for Exam: post cvc Acuity: Unknown Type of  Exam: Subsequent/Follow-up FINDINGS: Cardiac silhouette is stable.  Dense atherosclerotic changes of the aorta. Mitral valve calcification.  Postoperative changes of median sternotomy.  No focal consolidation, pleural effusion, or pneumothorax identified. Right-sided IJ central venous catheter with tip at the level of the cavoatrial junction.  The bones are osteopenic.  No acute osseous abnormality identified.  Surgical clips project over the upper abdomen.     1. Right IJ central venous catheter tip at the level of the cavoatrial junction.  No pneumothorax identified.       Scheduled Medicines   Medications:   ??? 0.9 % sodium chloride  250 mL Intravenous Once   ??? sodium chloride flush  10 mL Intravenous 2 times per day   ??? heparin (porcine)  5,000 Units Subcutaneous 3 times per day   ??? carvedilol  3.125 mg Oral BID   ??? docusate sodium  100 mg Oral BID   ??? fluticasone  1 spray Nasal Daily   ??? furosemide  40 mg Oral BID   ??? hydrALAZINE  100 mg Oral TID   ??? isosorbide dinitrate  30 mg Oral TID   ??? NIFEdipine  30 mg Oral BID   ??? [START ON 02/25/2018] b complex-C-E-zinc  1 tablet Oral Daily   ??? insulin lispro  0-12 Units Subcutaneous TID WC   ??? insulin lispro  0-6 Units Subcutaneous Nightly      Infusions:   ??? dextrose           IMPRESSION    Patient Active Problem List   Diagnosis   ??? Type II or unspecified type diabetes mellitus without mention of complication, not stated as uncontrolled   ??? Shortness of breath on exertion   ??? Anemia of chronic disease   ??? Kidney problem   ??? CAD (coronary artery disease)   ??? Arthritis   ??? Chest pain   ??? Essential hypertension   ??? PAF (paroxysmal atrial fibrillation) ([place])   ??? Stage 4 chronic kidney disease ([place])   ??? ARF (acute renal failure) ([place])   ??? AKI (acute kidney injury) ([place])   ??? Traumatic seroma of left lower leg   ??? Acute renal failure superimposed on stage 4 chronic kidney disease ([place])   ??? Uncontrolled type 2 diabetes mellitus ([place])   ??? Acute on chronic blood loss anemia (HCC)  RECOMMENDATIONS:      1. Reviewed POC blood glucose . Labs and X ray results   2. Reviewed Home and Current Medicines   3. Will Start On Correction bolus Humalog Insulin regime    4. Monitor Blood glucose frequently   5. Was given packed red cell transfusion  6. Modify  the dose of Insulin  as needed   7. On scheduled hemodialysis   Will follow with you  Again thank you for sharing pt's care with me.     Truly yours,       [name] MD
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
## 2018-02-24 NOTE — Progress Notes (Signed)
Pt arrived to large IR room for a temp vas cath placement. Pt tolerated without diff. Pig tale on the vas cath the lumen is too tight and unable to use. Do not use Pig tale.

## 2018-02-25 LAB — COMPREHENSIVE METABOLIC PANEL W/ REFLEX TO MG FOR LOW K
ALT: <5 U/L — ABNORMAL LOW (ref 10–40)
AST: 13 IU/L — ABNORMAL LOW (ref 15–37)
Albumin: 3.2 GM/DL — ABNORMAL LOW (ref 3.4–5.0)
Alkaline Phosphatase: 48 IU/L (ref 40–128)
Anion Gap: 11 (ref 4–16)
BUN: 49 MG/DL — ABNORMAL HIGH (ref 6–23)
CO2: 23 MMOL/L (ref 21–32)
Calcium: 7.4 MG/DL — ABNORMAL LOW (ref 8.3–10.6)
Chloride: 103 mMol/L (ref 99–110)
Creatinine: 1.8 MG/DL — ABNORMAL HIGH (ref 0.6–1.1)
GFR African American: 32 mL/min/{1.73_m2} — ABNORMAL LOW (ref >60)
GFR Non-African American: 27 mL/min/{1.73_m2} — ABNORMAL LOW (ref >60)
Glucose: 119 MG/DL — ABNORMAL HIGH (ref 70–99)
Potassium: 3.9 MMOL/L (ref 3.5–5.1)
Sodium: 137 MMOL/L (ref 135–145)
Total Bilirubin: 0.9 MG/DL (ref 0.0–1.0)
Total Protein: 5 GM/DL — ABNORMAL LOW (ref 6.4–8.2)

## 2018-02-25 LAB — CBC WITH AUTO DIFFERENTIAL
Basophils %: 0.3 % (ref 0–1)
Basophils Absolute: 0 10*3/uL
Eosinophils %: 1.2 % (ref 0–3)
Eosinophils Absolute: 0.1 10*3/uL
Hematocrit: 24.2 % — ABNORMAL LOW (ref 37–47)
Hemoglobin: 7.7 GM/DL — ABNORMAL LOW (ref 12.5–16.0)
Immature Neutrophil %: 0.3 % (ref 0–0.43)
Lymphocytes %: 18.3 % — ABNORMAL LOW (ref 24–44)
Lymphocytes Absolute: 1.4 10*3/uL
MCH: 30.3 PG (ref 27–31)
MCHC: 31.8 % — ABNORMAL LOW (ref 32.0–36.0)
MCV: 95.3 FL (ref 78–100)
MPV: 9.2 FL (ref 7.5–11.1)
Monocytes %: 11.4 % — ABNORMAL HIGH (ref 0–4)
Monocytes Absolute: 0.9 10*3/uL
Nucleated RBC %: 0 %
Platelets: 141 10*3/uL (ref 140–440)
RBC: 2.54 10*6/uL — ABNORMAL LOW (ref 4.2–5.4)
RDW: 20.7 % — ABNORMAL HIGH (ref 11.7–14.9)
Segs Absolute: 5.4 10*3/uL
Segs Relative: 68.5 % — ABNORMAL HIGH (ref 36–66)
Total Immature Neutrophil: 0.02 10*3/uL
Total Nucleated RBC: 0 10*3/uL
WBC: 7.8 10*3/uL (ref 4.0–10.5)

## 2018-02-25 LAB — HEMOGLOBIN A1C
Hemoglobin A1C: 5 % (ref 4.2–6.3)
eAG: 97 mg/dL

## 2018-02-25 LAB — CBC
Hematocrit: 24.3 % — ABNORMAL LOW (ref 37–47)
Hemoglobin: 7.7 GM/DL — ABNORMAL LOW (ref 12.5–16.0)
MCH: 30.6 PG (ref 27–31)
MCHC: 31.7 % — ABNORMAL LOW (ref 32.0–36.0)
MCV: 96.4 FL (ref 78–100)
MPV: 9.5 FL (ref 7.5–11.1)
Platelets: 143 10*3/uL (ref 140–440)
RBC: 2.52 10*6/uL — ABNORMAL LOW (ref 4.2–5.4)
RDW: 21.1 % — ABNORMAL HIGH (ref 11.7–14.9)
WBC: 8.5 10*3/uL (ref 4.0–10.5)

## 2018-02-25 LAB — POCT GLUCOSE
POC Glucose: 169 MG/DL — ABNORMAL HIGH (ref 70–99)
POC Glucose: 126 MG/DL — ABNORMAL HIGH (ref 70–99)
POC Glucose: 134 MG/DL — ABNORMAL HIGH (ref 70–99)
POC Glucose: 128 MG/DL — ABNORMAL HIGH (ref 70–99)

## 2018-02-25 MED ORDER — INSULIN LISPRO 100 UNIT/ML SC SOLN
100 UNIT/ML | SUBCUTANEOUS | Status: DC
Start: 2018-02-25 — End: 2018-02-25

## 2018-02-25 MED ORDER — INSULIN LISPRO 100 UNIT/ML SC SOLN
100 UNIT/ML | SUBCUTANEOUS | Status: DC
Start: 2018-02-25 — End: 2018-03-08
  Administered 2018-02-26 – 2018-03-07 (×3): 1 [IU] via SUBCUTANEOUS

## 2018-02-25 MED ORDER — INSULIN LISPRO 100 UNIT/ML SC SOLN
100 UNIT/ML | Freq: Three times a day (TID) | SUBCUTANEOUS | Status: DC
Start: 2018-02-25 — End: 2018-03-08
  Administered 2018-02-26 – 2018-03-03 (×4): 1 [IU] via SUBCUTANEOUS
  Administered 2018-03-06: 21:00:00 2 [IU] via SUBCUTANEOUS
  Administered 2018-03-07: 16:00:00 4 [IU] via SUBCUTANEOUS

## 2018-02-25 MED FILL — HYDRALAZINE HCL 50 MG PO TABS: 50 mg | ORAL | Qty: 2

## 2018-02-25 MED FILL — ISOSORBIDE DINITRATE 20 MG PO TABS: 20 mg | ORAL | Qty: 2

## 2018-02-25 MED FILL — DOK 100 MG PO CAPS: 100 mg | ORAL | Qty: 1

## 2018-02-25 MED FILL — HEPARIN SODIUM (PORCINE) 5000 UNIT/ML IJ SOLN: 5000 [IU]/mL | INTRAMUSCULAR | Qty: 1

## 2018-02-25 MED FILL — NIFEDIPINE ER OSMOTIC RELEASE 30 MG PO TB24: 30 mg | ORAL | Qty: 1

## 2018-02-25 MED FILL — FUROSEMIDE 40 MG PO TABS: 40 mg | ORAL | Qty: 1

## 2018-02-25 MED FILL — CARVEDILOL 3.125 MG PO TABS: 3.125 mg | ORAL | Qty: 1

## 2018-02-25 MED FILL — STRESS B-COMPLEX/VIT C/ZINC PO TABS: ORAL | Qty: 1

## 2018-02-25 NOTE — Care Coordination-Inpatient (Signed)
LSW noted Pt had a consult for case management for set up for out HD. LSW is waiting on the final result for the Hepatitis B core antibody total before HD can be set up.     Electronically signed by Bunnie Pion, LSW on 02/25/2018 at 3:02 PM
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
## 2018-02-25 NOTE — Progress Notes (Signed)
Progress Note (Hospitalist, Internal Medicine)  IDENTIFYING INFORMATION   PATIENT:  Judy Velasquez  MRN:  [id number]  ADMIT DATE: 02/24/2018  TIME OF EVALUATION: 02/25/2018 8:55 AM      HISTORY OF PRESENT ILLNESS   Mardel Grudzien is a 84 y.o. female who is direct admit from Dr. Maretta Bees office for low hemoglobin level and need dialysis. Patient has PMH of CAD, PAF, CKD stage 4, DM type 2, hyperlipidemia, and hypertension. Patient is awake, alert, and oriented only to self and place but disoriented to time and situation. Patient stated that she is not okay because she cannot see. Patient has h/o of glaucoma. But patient can identify how many fingers the nurse at bedside holding. Pt seen and examined. Patient denies CP, palpitation, fever, chills or diaphoresis. Denies cough, SOB or difficulty breathing. Denies any other symptoms including HA, paresthesias, abdominal pain, N/V/D, constipation, changes in bowels, dysuria, hematuria, frequency or urgency, and B/L weakness.   ??        CC: fatigue    SUBJECTIVE     States she doesn't feel as tired today. Wants to go home on D/C; no acute bleeding reported    MEDICATIONS   Medications Prior to Admission  Medications Prior to Admission: HYDROcodone-acetaminophen (NORCO) 5-325 MG per tablet, Take 1 tablet by mouth every 6 hours as needed for Pain.  furosemide (LASIX) 40 MG tablet, Take 40 mg by mouth 2 times daily  isosorbide dinitrate (ISORDIL) 30 MG tablet, Take 1 tablet by mouth 3 times daily  hydrALAZINE (APRESOLINE) 100 MG tablet, Take 1 tablet by mouth 3 times daily  carvedilol (COREG) 3.125 MG tablet, Take 1 tablet by mouth 2 times daily  NIFEdipine (ADALAT CC) 30 MG extended release tablet, Take 1 tablet by mouth 2 times daily  docusate sodium (PHILLIPS STOOL SOFTENER) 100 MG capsule, Take 1 capsule by mouth 2 times daily  B Complex-Biotin-FA (SUPER B-50 B COMPLEX PO), Take by mouth  fluticasone (FLONASE) 50 MCG/ACT nasal spray, 1 spray by Nasal route daily    Current  Medications  Current Facility-Administered Medications   Medication Dose Route Frequency Provider Last Rate Last Dose   ??? insulin lispro (HUMALOG) injection vial 0-6 Units  0-6 Units Subcutaneous TID WC M Jamesetta Orleans, MD       ??? insulin lispro (HUMALOG) injection vial 0-3 Units  0-3 Units Subcutaneous 2 times per day M Husain Jawadi, MD       ??? 0.9 % sodium chloride infusion 250 mL  250 mL Intravenous Once Claretta Fraise, MD       ??? sodium chloride flush 0.9 % injection 10 mL  10 mL Intravenous 2 times per day Clydie Braun, APRN - CNP   10 mL at 02/25/18 0811   ??? sodium chloride flush 0.9 % injection 10 mL  10 mL Intravenous PRN Angelita Ramos, APRN - CNP       ??? ondansetron (ZOFRAN) injection 4 mg  4 mg Intravenous Q6H PRN Clydie Braun, APRN - CNP       ??? heparin (porcine) injection 5,000 Units  5,000 Units Subcutaneous 3 times per day Clydie Braun, APRN - CNP   5,000 Units at 02/25/18 0636   ??? carvedilol (COREG) tablet 3.125 mg  3.125 mg Oral BID Clydie Braun, APRN - CNP   3.125 mg at 02/25/18 0809   ??? docusate sodium (COLACE) capsule 100 mg  100 mg Oral BID Clydie Braun, APRN - CNP   100 mg at 02/25/18 0809   ???  fluticasone (FLONASE) 50 MCG/ACT nasal spray 1 spray  1 spray Nasal Daily Clydie Braun, APRN - CNP   1 spray at 02/25/18 1914   ??? furosemide (LASIX) tablet 40 mg  40 mg Oral BID Clydie Braun, APRN - CNP   40 mg at 02/25/18 0809   ??? hydrALAZINE (APRESOLINE) tablet 100 mg  100 mg Oral TID Clydie Braun, APRN - CNP   100 mg at 02/25/18 7829   ??? isosorbide dinitrate (ISORDIL) tablet 30 mg  30 mg Oral TID Clydie Braun, APRN - CNP   30 mg at 02/25/18 0810   ??? NIFEdipine (PROCARDIA XL) extended release tablet 30 mg  30 mg Oral BID Clydie Braun, APRN - CNP   30 mg at 02/25/18 5621   ??? b complex-C-E-zinc (STRESS FORMULA W/ ZINC) 1 tablet  1 tablet Oral Daily Clydie Braun, APRN - CNP   1 tablet at 02/25/18 3086   ??? glucose (GLUTOSE) 40 % oral gel 15 g  15 g Oral PRN Angelita Ramos, APRN - CNP        ??? dextrose 50 % IV solution  12.5 g Intravenous PRN Clydie Braun, APRN - CNP       ??? glucagon (rDNA) injection 1 mg  1 mg Intramuscular PRN Angelita Ramos, APRN - CNP       ??? dextrose 5 % solution  100 mL/hr Intravenous PRN Clydie Braun, APRN - CNP             Allergies  Allergies   Allergen Reactions   ??? Celebrex [Celecoxib] Shortness Of Breath, Swelling and Anaphylaxis     "Swelling Of The Esophagus"   ??? Fish Allergy Anaphylaxis   ??? Lisinopril Other (See Comments)     Dry Mouth   ??? Neurontin [Gabapentin] Other (See Comments)     Dizziness   ??? Norvasc [Amlodipine Besylate] Other (See Comments)     Edema Of Legs   ??? Other      "Allergic To Certain Seafoods Causing Headaches, Difficulty Breathing"   ??? Shellfish Allergy Anaphylaxis   ??? Ciprofloxacin Rash   ??? Lipitor Other (See Comments)     Ache   ??? Losartan      Per nephrologist       REVIEW OF SYSTEMS     Within above limitations. 14 point review of systems reviewed. Pertinent positive or negative as per HPI or otherwise negative per 14 point systems review.     Reviewed 02/25/2018 at 8:55 AM    PHYSICAL EXAM       Blood pressure (!) 182/82, pulse 66, temperature 98.3 ??F (36.8 ??C), temperature source Oral, resp. rate 20, weight 72 lb 15.6 oz (33.1 kg), SpO2 100 %, not currently breastfeeding.    General - AAO x 3  Psych - Appropriate affect/speech. No agitation  Eyes - Eye lids intact. No scleral icterus  ENT - Lips wnl. External ear clear/dry/intact. No thyromegaly on inspection  Neuro - No gross peripheral or central neuro deficits on inspection  Heart - Sinus. RRR. S1 and S2 present. No added HS/murmurs appreciated. No elevated JVD appreciated  Lung - Adequate air entry b/l, No crackles/wheezes appreciated  GI -  Soft. No guarding/rigidity. No hepatosplenomegaly/ascites. BS+  GU - No CVA/suprapubic tenderness or palpable bladder distension  Skin - Intact. No rash/petechiae/ecchymosis. Warm extremities; RIJ secure  MSK - Joints with normal ROM. No joint  swellings      Lines/Drains/Airways/Wounds:  @LDABRIEF @    LABS AND IMAGING  CBC  @CBCROUNDS @    Last 3 Hemoglobin  Lab Results   Component Value Date    HGB 7.7 02/25/2018    HGB 9.2 02/24/2018    HGB  02/24/2018     3.8  HGB CALLED TO [name] CHARGE RN @ [phone] BY LMCKINLEY MLT  RESULTS READ BACK       Last 3 WBC/ANC  Lab Results   Component Value Date    WBC 8.5 02/25/2018    WBC 6.3 02/24/2018    WBC 4.8 09/21/2017     No components found for: GRNLOCTYABS  Last 3 Platelets  No results found for: PLATELET  Chemistry  @CHEMROUNDS @  @LYTESROUNDS @  No results found for: LDH  Coagulation Studies  Lab Results   Component Value Date    INR 0.94 02/24/2018     Liver Function Studies  Lab Results   Component Value Date    ALT <5 02/25/2018    AST 13 02/25/2018    ALKPHOS 48 02/25/2018       Recent Imaging        Relevant labs and imaging reviewed    ASSESSMENT AND PLAN     Anajulia Leyendecker is a 84 y.o.  female who presents with Acute renal failure superimposed on stage 4 chronic kidney disease ([place])  ??  ??? Acute renal failure superimposed on stage 4 CKD - Crea 3.8, BUN 150, GFR 11 on admission         - admit inpatient, telemetry monitoring         -  Nephrology, Dr. Maretta Bees    -  IR for tunneled CVC placement         -   Dialyzed on 6/4                 - monitor I/O         - avoid nephrotoxic agents         - adjust meds for GFR if needed         - monitor Crea level   ?? - may need permacath for d/c    ??? Acute on chronic blood loss anemia - Hgb 3.8, no active bleeding noted, VS stable at this time.         - Type &??screen, transfused 2 unit PRBC per Dr. Maretta Bees  ????    ??- monitor H &??H q 12hr, stable  ????    ??- not on any antiplatelet or AC  ??????    - will transfuse blood for Hgb <7  ??  ??? Uncontrolled type 2 DM - serum glucose 201              -  HgbA1C = 5.0   - consult Endocrinology         -  ISS         - monitor POCT         - diabetic diet       ??      ??? Chronic Illnesses:??will continue current home  medications unless contraindicated by above plan and assessment.          - CAD         - PAF         - hyperlipidemia         - hypertension     PT/OT:  DVT-PPX: Heparin  Diet: renal  DISPO: home      Anoushka Divito [employer],  Internal Medicine  02/25/2018 at 8:55 AM

## 2018-02-26 LAB — CRITICAL CARE PANEL
Anion Gap: 13 (ref 4–16)
BUN: 63 MG/DL — ABNORMAL HIGH (ref 6–23)
CO2: 24 MMOL/L (ref 21–32)
Calcium: 7.7 MG/DL — ABNORMAL LOW (ref 8.3–10.6)
Chloride: 105 mMol/L (ref 99–110)
Creatinine: 2.4 MG/DL — ABNORMAL HIGH (ref 0.6–1.1)
GFR African American: 23 mL/min/{1.73_m2} — ABNORMAL LOW (ref >60)
GFR Non-African American: 19 mL/min/{1.73_m2} — ABNORMAL LOW (ref >60)
Glucose: 119 MG/DL — ABNORMAL HIGH (ref 70–99)
Magnesium: 2.6 mg/dl — ABNORMAL HIGH (ref 1.8–2.4)
Phosphorus: 3.9 MG/DL (ref 2.5–4.9)
Potassium: 3.7 MMOL/L (ref 3.5–5.1)
Sodium: 142 MMOL/L (ref 135–145)

## 2018-02-26 LAB — IRON AND TIBC
Iron: 29 ug/dL — ABNORMAL LOW (ref 37–145)
Iron: 109 ug/dL (ref 37–145)
TIBC: 173 ug/dL — ABNORMAL LOW (ref 250–450)
TIBC: 194 ug/dL — ABNORMAL LOW (ref 250–450)
Transferrin %: 17 % (ref 10–44)
Transferrin %: 56 % — ABNORMAL HIGH (ref 10–44)
UIBC: 144 ug/dL (ref 110–370)
UIBC: 85 ug/dL — ABNORMAL LOW (ref 110–370)

## 2018-02-26 LAB — POCT GLUCOSE
POC Glucose: 199 MG/DL — ABNORMAL HIGH (ref 70–99)
POC Glucose: 103 MG/DL — ABNORMAL HIGH (ref 70–99)
POC Glucose: 123 MG/DL — ABNORMAL HIGH (ref 70–99)
POC Glucose: 180 MG/DL — ABNORMAL HIGH (ref 70–99)
POC Glucose: 101 MG/DL — ABNORMAL HIGH (ref 70–99)

## 2018-02-26 LAB — HEPATITIS B CORE ANTIBODY, TOTAL: Hep B Core Total Ab: NEGATIVE

## 2018-02-26 LAB — FERRITIN: Ferritin: 654 NG/ML — ABNORMAL HIGH (ref 15–150)

## 2018-02-26 MED ORDER — DARBEPOETIN ALFA 60 MCG/ML IJ SOLN
60 MCG/ML | INTRAMUSCULAR | Status: DC
Start: 2018-02-26 — End: 2018-03-08
  Administered 2018-02-26 – 2018-03-05 (×2): 60 ug via SUBCUTANEOUS

## 2018-02-26 MED FILL — FUROSEMIDE 40 MG PO TABS: 40 mg | ORAL | Qty: 1

## 2018-02-26 MED FILL — ISOSORBIDE DINITRATE 20 MG PO TABS: 20 mg | ORAL | Qty: 2

## 2018-02-26 MED FILL — HEPARIN SODIUM (PORCINE) 5000 UNIT/ML IJ SOLN: 5000 [IU]/mL | INTRAMUSCULAR | Qty: 1

## 2018-02-26 MED FILL — DOK 100 MG PO CAPS: 100 mg | ORAL | Qty: 1

## 2018-02-26 MED FILL — NIFEDIPINE ER OSMOTIC RELEASE 30 MG PO TB24: 30 mg | ORAL | Qty: 1

## 2018-02-26 MED FILL — HYDRALAZINE HCL 50 MG PO TABS: 50 mg | ORAL | Qty: 2

## 2018-02-26 MED FILL — CARVEDILOL 3.125 MG PO TABS: 3.125 mg | ORAL | Qty: 1

## 2018-02-26 MED FILL — STRESS B-COMPLEX/VIT C/ZINC PO TABS: ORAL | Qty: 1

## 2018-02-26 MED FILL — ARANESP (ALBUMIN FREE) 60 MCG/ML IJ SOLN: 60 ug/mL | INTRAMUSCULAR | Qty: 1

## 2018-02-26 NOTE — Progress Notes (Signed)
Progress Note (Hospitalist, Internal Medicine)  IDENTIFYING INFORMATION   PATIENT:  Judy Velasquez  MRN:  [id number]  ADMIT DATE: 02/24/2018  TIME OF EVALUATION: 02/26/2018 8:01 AM      HISTORY OF PRESENT ILLNESS   Kacey Dysert is a 84 y.o. female who is direct admit from Dr. Maretta Bees office for low hemoglobin level and need dialysis. Patient has PMH of CAD, PAF, CKD stage 4, DM type 2, hyperlipidemia, and hypertension. Patient is awake, alert, and oriented only to self and place but disoriented to time and situation. Patient stated that she is not okay because she cannot see. Patient has h/o of glaucoma. But patient can identify how many fingers the nurse at bedside holding. Pt seen and examined. Patient denies CP, palpitation, fever, chills or diaphoresis. Denies cough, SOB or difficulty breathing. Denies any other symptoms including HA, paresthesias, abdominal pain, N/V/D, constipation, changes in bowels, dysuria, hematuria, frequency or urgency, and B/L weakness.   ??        CC: fatigue    SUBJECTIVE     States she doesn't feel as tired today. Wants to go home on D/C; no acute bleeding reported ; she feels much better after a blood transfusion and dialysis.  Patient states that she had a colonoscopy, and she would like not to have another colonoscopy, patient she has a risk of acute bleed.    MEDICATIONS   Medications Prior to Admission  Medications Prior to Admission: HYDROcodone-acetaminophen (NORCO) 5-325 MG per tablet, Take 1 tablet by mouth every 6 hours as needed for Pain.  furosemide (LASIX) 40 MG tablet, Take 40 mg by mouth 2 times daily  isosorbide dinitrate (ISORDIL) 30 MG tablet, Take 1 tablet by mouth 3 times daily  hydrALAZINE (APRESOLINE) 100 MG tablet, Take 1 tablet by mouth 3 times daily  carvedilol (COREG) 3.125 MG tablet, Take 1 tablet by mouth 2 times daily  NIFEdipine (ADALAT CC) 30 MG extended release tablet, Take 1 tablet by mouth 2 times daily  docusate sodium (PHILLIPS STOOL SOFTENER) 100 MG  capsule, Take 1 capsule by mouth 2 times daily  B Complex-Biotin-FA (SUPER B-50 B COMPLEX PO), Take by mouth  fluticasone (FLONASE) 50 MCG/ACT nasal spray, 1 spray by Nasal route daily    Current Medications  Current Facility-Administered Medications   Medication Dose Route Frequency Provider Last Rate Last Dose   ??? insulin lispro (HUMALOG) injection vial 0-6 Units  0-6 Units Subcutaneous TID WC M Jamesetta Orleans, MD       ??? insulin lispro (HUMALOG) injection vial 0-3 Units  0-3 Units Subcutaneous 2 times per day [name], MD   1 Units at 02/26/18 0001   ??? 0.9 % sodium chloride infusion 250 mL  250 mL Intravenous Once Claretta Fraise, MD       ??? sodium chloride flush 0.9 % injection 10 mL  10 mL Intravenous 2 times per day Clydie Braun, APRN - CNP   10 mL at 02/25/18 2150   ??? sodium chloride flush 0.9 % injection 10 mL  10 mL Intravenous PRN Angelita Ramos, APRN - CNP       ??? ondansetron (ZOFRAN) injection 4 mg  4 mg Intravenous Q6H PRN Clydie Braun, APRN - CNP       ??? heparin (porcine) injection 5,000 Units  5,000 Units Subcutaneous 3 times per day Clydie Braun, APRN - CNP   5,000 Units at 02/26/18 0558   ??? carvedilol (COREG) tablet 3.125 mg  3.125 mg Oral BID Angelita  Ramos, APRN - CNP   3.125 mg at 02/25/18 2148   ??? docusate sodium (COLACE) capsule 100 mg  100 mg Oral BID Clydie Braun, APRN - CNP   100 mg at 02/25/18 2148   ??? fluticasone (FLONASE) 50 MCG/ACT nasal spray 1 spray  1 spray Nasal Daily Clydie Braun, APRN - CNP   1 spray at 02/25/18 5621   ??? furosemide (LASIX) tablet 40 mg  40 mg Oral BID Clydie Braun, APRN - CNP   40 mg at 02/25/18 1830   ??? hydrALAZINE (APRESOLINE) tablet 100 mg  100 mg Oral TID Clydie Braun, APRN - CNP   100 mg at 02/26/18 0558   ??? isosorbide dinitrate (ISORDIL) tablet 30 mg  30 mg Oral TID Clydie Braun, APRN - CNP   30 mg at 02/25/18 2153   ??? NIFEdipine (PROCARDIA XL) extended release tablet 30 mg  30 mg Oral BID Clydie Braun, APRN - CNP   30 mg at 02/25/18 2147    ??? b complex-C-E-zinc (STRESS FORMULA W/ ZINC) 1 tablet  1 tablet Oral Daily Clydie Braun, APRN - CNP   1 tablet at 02/25/18 3086   ??? glucose (GLUTOSE) 40 % oral gel 15 g  15 g Oral PRN Angelita Ramos, APRN - CNP       ??? dextrose 50 % IV solution  12.5 g Intravenous PRN Clydie Braun, APRN - CNP       ??? glucagon (rDNA) injection 1 mg  1 mg Intramuscular PRN Angelita Ramos, APRN - CNP       ??? dextrose 5 % solution  100 mL/hr Intravenous PRN Clydie Braun, APRN - CNP             Allergies  Allergies   Allergen Reactions   ??? Celebrex [Celecoxib] Shortness Of Breath, Swelling and Anaphylaxis     "Swelling Of The Esophagus"   ??? Fish Allergy Anaphylaxis   ??? Lisinopril Other (See Comments)     Dry Mouth   ??? Neurontin [Gabapentin] Other (See Comments)     Dizziness   ??? Norvasc [Amlodipine Besylate] Other (See Comments)     Edema Of Legs   ??? Other      "Allergic To Certain Seafoods Causing Headaches, Difficulty Breathing"   ??? Shellfish Allergy Anaphylaxis   ??? Ciprofloxacin Rash   ??? Lipitor Other (See Comments)     Ache   ??? Losartan      Per nephrologist       REVIEW OF SYSTEMS     Within above limitations. 14 point review of systems reviewed. Pertinent positive or negative as per HPI or otherwise negative per 14 point systems review.     Reviewed 02/26/2018 at 8:01 AM    PHYSICAL EXAM       Blood pressure 139/65, pulse 73, temperature 98.9 ??F (37.2 ??C), temperature source Oral, resp. rate 16, weight 77 lb 9.6 oz (35.2 kg), SpO2 98 %, not currently breastfeeding.    General - AAO x 3  Psych - Appropriate affect/speech. No agitation  Eyes - Eye lids intact. No scleral icterus  ENT - Lips wnl. External ear clear/dry/intact. No thyromegaly on inspection  Neuro - No gross peripheral or central neuro deficits on inspection  Heart - Sinus. RRR. S1 and S2 present. No added HS/murmurs appreciated. No elevated JVD appreciated  Lung - Adequate air entry b/l, No crackles/wheezes appreciated  GI -  Soft. No guarding/rigidity. No  hepatosplenomegaly/ascites. BS+  GU - No CVA/suprapubic tenderness or  palpable bladder distension  Skin - Intact. No rash/petechiae/ecchymosis. Warm extremities; RIJ secure  MSK - Joints with normal ROM. No joint swellings      Lines/Drains/Airways/Wounds:  @LDABRIEF @    LABS AND IMAGING   CBC  @CBCROUNDS @    Last 3 Hemoglobin  Lab Results   Component Value Date    HGB 7.7 02/25/2018    HGB 7.7 02/25/2018    HGB 9.2 02/24/2018     Last 3 WBC/ANC  Lab Results   Component Value Date    WBC 7.8 02/25/2018    WBC 8.5 02/25/2018    WBC 6.3 02/24/2018     No components found for: GRNLOCTYABS  Last 3 Platelets  No results found for: PLATELET  Chemistry  @CHEMROUNDS @  @LYTESROUNDS @  No results found for: LDH  Coagulation Studies  Lab Results   Component Value Date    INR 0.94 02/24/2018     Liver Function Studies  Lab Results   Component Value Date    ALT <5 02/25/2018    AST 13 02/25/2018    ALKPHOS 48 02/25/2018       Recent Imaging        Relevant labs and imaging reviewed    ASSESSMENT AND PLAN     Bridney Guadarrama is a 84 y.o.  female who presents with Acute renal failure superimposed on stage 4 chronic kidney disease ([place])  ??  ??? Acute renal failure superimposed on stage 4 CKD -    [Crea 3.8, BUN 150, GFR 11 on admission]         - admit inpatient, telemetry monitoring         -  Nephrology, Dr. Maretta Bees    -  IR for tunneled CVC placement         -   Dialyzed on 6/4, consider dialysis on 02/27/2018                 - monitor I/O         - avoid nephrotoxic agents         - adjust meds for GFR if needed         - monitor Crea level   ?? - may need permacath for d/c    ??? Acute on chronic blood loss anemia - Hgb 3.8, no active bleeding noted, VS stable at this time.         - Type &??screen, transfused 2 unit PRBC per Dr. Maretta Bees  ????    ??- monitor H &??H q 12hr, stable  ????    ??- not on any antiplatelet or AC  ??????    - will transfuse blood for Hgb <7  ?? - will ask GI consult if pt agrees    ??? Uncontrolled type 2 DM - serum glucose 201               -  HgbA1C = 5.0   - consult Endocrinology         -  ISS         - monitor POCT         - diabetic diet       ??      ??? Chronic Illnesses:??will continue current home medications unless contraindicated by above plan and assessment.          - CAD         - PAF         - hyperlipidemia         -  hypertension     PT/OT:  DVT-PPX: Heparin  Diet: renal  DISPO: home      Alisea [facility], Internal Medicine  02/26/2018 at 8:01 AM

## 2018-02-26 NOTE — Care Coordination-Inpatient (Addendum)
CM called [facility] admissions/[phone], spoke with [facility].  Judy Velasquez is to fax this CM the intake forms to be completed and sent back to initiate referral for OP HD.    1358  CM faxed [facility] intake form, Facesheet, dialsyis order, H&P, CXR and Hep Panel to [facility] @ [phone].  Placed in patients soft chart.

## 2018-02-26 NOTE — Plan of Care (Signed)
Problem: Infection:  Goal: Will remain free from infection  Outcome: Ongoing     Problem: Safety:  Goal: Free from accidental physical injury  Outcome: Ongoing  Goal: Free from intentional harm  Outcome: Ongoing     Problem: Daily Care:  Goal: Daily care needs are met  Outcome: Ongoing     Problem: Pain:  Goal: Patient's pain/discomfort is manageable  Outcome: Ongoing     Problem: Discharge Planning:  Goal: Patients continuum of care needs are met  Outcome: Ongoing     Problem: Falls - Risk of:  Goal: Will remain free from falls  Outcome: Ongoing  Goal: Absence of physical injury  Outcome: Ongoing     Problem: Risk for Impaired Skin Integrity  Goal: Tissue integrity - skin and mucous membranes  Outcome: Ongoing

## 2018-02-26 NOTE — Plan of Care (Signed)
Problem: Infection:  Goal: Will remain free from infection  Description  Will remain free from infection  02/26/2018 1102 by Cecelia Byars, RN  Outcome: Ongoing  02/26/2018 0147 by Mickie Kay, RN  Outcome: Ongoing     Problem: Safety:  Goal: Free from accidental physical injury  Description  Free from accidental physical injury  02/26/2018 1102 by Cecelia Byars, RN  Outcome: Ongoing  02/26/2018 0147 by Mickie Kay, RN  Outcome: Ongoing  Goal: Free from intentional harm  Description  Free from intentional harm  02/26/2018 1102 by Cecelia Byars, RN  Outcome: Ongoing  02/26/2018 0147 by Mickie Kay, RN  Outcome: Ongoing     Problem: Daily Care:  Goal: Daily care needs are met  Description  Daily care needs are met  02/26/2018 1102 by Cecelia Byars, RN  Outcome: Ongoing  02/26/2018 0147 by Mickie Kay, RN  Outcome: Ongoing     Problem: Pain:  Goal: Patient's pain/discomfort is manageable  Description  Patient's pain/discomfort is manageable  02/26/2018 1102 by Cecelia Byars, RN  Outcome: Ongoing  02/26/2018 0147 by Mickie Kay, RN  Outcome: Ongoing     Problem: Discharge Planning:  Goal: Patients continuum of care needs are met  Description  Patients continuum of care needs are met  02/26/2018 1102 by Cecelia Byars, RN  Outcome: Ongoing  02/26/2018 0147 by Mickie Kay, RN  Outcome: Ongoing     Problem: Falls - Risk of:  Goal: Will remain free from falls  Description  Will remain free from falls  02/26/2018 1102 by Cecelia Byars, RN  Outcome: Ongoing  02/26/2018 0147 by Mickie Kay, RN  Outcome: Ongoing  Goal: Absence of physical injury  Description  Absence of physical injury  02/26/2018 1102 by Cecelia Byars, RN  Outcome: Ongoing  02/26/2018 0147 by Mickie Kay, RN  Outcome: Ongoing     Problem: Risk for Impaired Skin Integrity  Goal: Tissue integrity - skin and mucous membranes  Description  Structural intactness and normal physiological  function of skin and  mucous membranes.  02/26/2018 1102 by Cecelia Byars, RN  Outcome: Ongoing  02/26/2018 0147 by Mickie Kay, RN  Outcome: Ongoing

## 2018-02-26 NOTE — Progress Notes (Signed)
Progress Note( Dr. Loel Dubonnet)  02/26/2018  Subjective:   Admit Date: 02/24/2018  PCP: [name], MD    Admitted For : Severe anemia with hemoglobin meal was 3+    Consulted For: Better control of blood glucose    Interval History: Patient is much better more alert and awake have normal communications    Denies any chest pains,   Mild to moderate SOB .   Denies nausea or vomiting.   No new bowel or bladder symptoms.     No intake or output data in the 24 hours ending 02/26/18 0651    DATA    CBC:   Recent Labs     02/24/18  1234 02/24/18  1828 02/25/18  0502 02/25/18  1853   WBC 6.3  --  8.5 7.8   HGB 3.8  HGB CALLED TO [name] CHARGE RN @ [id number] BY LMCKINLEY MLT  RESULTS READ BACK  * 9.2* 7.7* 7.7*   PLT 161  --  143 141    CMP:  Recent Labs     02/23/18  0740 02/24/18  1152 02/25/18  0502   NA 139 136 137   K 4.6 4.6 3.9   CL 98* 96* 103   CO2 23 23 23    BUN 141* 150* 49*   CREATININE 4.0* 3.8* 1.8*   CALCIUM 8.1* 7.8* 7.4*   PROT  --  5.1* 5.0*   LABALBU 3.3* 3.3* 3.2*   BILITOT  --  0.3 0.9   ALKPHOS  --  46 48   AST  --  9* 13*   ALT  --  <5* <5*     Lipids: No results found for: CHOL, HDL, TRIG  Glucose:  Recent Labs     02/25/18  1747 02/25/18  2343 02/26/18  0240   POCGLU 128* 199* 103*     HemoglobinA1C:  Lab Results   Component Value Date    LABA1C 5.0 02/25/2018     High Sensitivity TSH: No results found for: TSHHS  Free T3: No results found for: FT3  Free T4:No results found for: T4FREE    Xr Chest Portable    Result Date: 02/24/2018  EXAMINATION: ONE XRAY VIEW OF THE CHEST 02/24/2018 2:37 pm COMPARISON: Chest x-ray August 18, 2017 HISTORY: ORDERING SYSTEM PROVIDED HISTORY: post cvc      1. Right IJ central venous catheter tip at the level of the cavoatrial junction.  No pneumothorax identified.       Scheduled Medicines   Medications:   ??? insulin lispro  0-6 Units Subcutaneous TID WC   ??? insulin lispro  0-3 Units Subcutaneous 2 times per day   ??? 0.9 % sodium chloride  250 mL Intravenous Once    ??? sodium chloride flush  10 mL Intravenous 2 times per day   ??? heparin (porcine)  5,000 Units Subcutaneous 3 times per day   ??? carvedilol  3.125 mg Oral BID   ??? docusate sodium  100 mg Oral BID   ??? fluticasone  1 spray Nasal Daily   ??? furosemide  40 mg Oral BID   ??? hydrALAZINE  100 mg Oral TID   ??? isosorbide dinitrate  30 mg Oral TID   ??? NIFEdipine  30 mg Oral BID   ??? b complex-C-E-zinc  1 tablet Oral Daily      Infusions:   ??? dextrose           Objective:   Vitals: BP 139/65  Pulse 73    Temp 98.9 ??F (37.2 ??C) (Oral)    Resp 16    Wt 77 lb 9.6 oz (35.2 kg)    SpO2 98%    BMI 17.40 kg/Judy Velasquez??   General appearance: alert and cooperative with exam  Neck: no JVD or bruit  Thyroid : Normal lobes   Lungs: Has Vesicular Breath sounds   Heart:  regular rate and rhythm  Abdomen: soft, non-tender; bowel sounds normal; no masses,  no organomegaly  Musculoskeletal: Normal  Extremities: extremities normal, , no edema  Neurologic:  Awake, alert, oriented to name, place and time.  Cranial nerves II-XII are grossly intact.  Motor is  intact.  Sensory is intact.,  and gait is abnormal.  And unstable    Assessment:     Patient Active Problem List:     Type II or unspecified type diabetes mellitus without mention of complication, not stated as uncontrolled     Shortness of breath on exertion     Anemia of chronic disease     CAD (coronary artery disease)     Arthritis     Essential hypertension     PAF (paroxysmal atrial fibrillation) (HCC)     Stage 4 chronic kidney disease (HCC)     ARF (acute renal failure) (HCC)     Traumatic seroma of left lower leg     Acute renal failure superimposed on stage 4 chronic kidney disease ([place])     Acute on chronic blood loss anemia ([place])      Plan:     1. Reviewed POC blood glucose . Labs and X ray results   2. Reviewed Current Medicines   3. On  Correction bolus Humalog Insulin regime    4. Monitor Blood glucose frequently   5. Modified  the dose of Insulin/ other medicines as needed   6. On his  scheduled hemodialysis  7. Will follow     .     [name], MD

## 2018-02-26 NOTE — Progress Notes (Signed)
[facility] [address], [place] [address]  Phone: [phone]  Office Hours: 8:30AM - 4:30PM  Monday - Friday     Nephrology Progress Note  02/26/2018 8:25 AM  Subjective:   Admit Date: 02/24/2018  PCP: [name], MD  Interval History:  Nausea but did not throw up today  Appetite poor  Making a little more urine  Diet: DIET RENAL; Carb Control: 4 carb choices (60 gms)/meal; Low Sodium (2 GM); Daily Fluid Restriction: 1500 ml      Data:   Scheduled Meds:  ??? insulin lispro  0-6 Units Subcutaneous TID WC   ??? insulin lispro  0-3 Units Subcutaneous 2 times per day   ??? 0.9 % sodium chloride  250 mL Intravenous Once   ??? sodium chloride flush  10 mL Intravenous 2 times per day   ??? heparin (porcine)  5,000 Units Subcutaneous 3 times per day   ??? carvedilol  3.125 mg Oral BID   ??? docusate sodium  100 mg Oral BID   ??? fluticasone  1 spray Nasal Daily   ??? furosemide  40 mg Oral BID   ??? hydrALAZINE  100 mg Oral TID   ??? isosorbide dinitrate  30 mg Oral TID   ??? NIFEdipine  30 mg Oral BID   ??? b complex-C-E-zinc  1 tablet Oral Daily     Continuous Infusions:  ??? dextrose       PRN Meds:sodium chloride flush, ondansetron, glucose, dextrose, glucagon (rDNA), dextrose  No intake/output data recorded.  No intake/output data recorded.  No intake or output data in the 24 hours ending 02/26/18 0825  CBC:   Recent Labs     02/24/18  1234 02/24/18  1828 02/25/18  0502 02/25/18  1853   WBC 6.3  --  8.5 7.8   HGB 3.8  HGB CALLED TO [name] CHARGE RN @ 1247 [id number] BY LMCKINLEY MLT  RESULTS READ BACK  * 9.2* 7.7* 7.7*   PLT 161  --  143 141     BMP:    Recent Labs     02/24/18  1152 02/25/18  0502 02/26/18  0656   NA 136 137 142   K 4.6 3.9 3.7   CL 96* 103 105   CO2 23 23 24    BUN 150* 49* 63*   CREATININE 3.8* 1.8* 2.4*   GLUCOSE 201* 119* 119*     Hepatic:   Recent Labs     02/24/18  1152 02/25/18  0502   AST 9* 13*   ALT <5* <5*   BILITOT 0.3 0.9   ALKPHOS 46 48     Troponin: No results for input(s):  TROPONINI in the last 72 hours.  BNP: No results for input(s): BNP in the last 72 hours.  Lipids: No results for input(s): CHOL, HDL in the last 72 hours.    Invalid input(s): LDLCALCU  ABGs: No results found for: PHART, PO2ART, PCO2ART  INR:   Recent Labs     02/24/18  1152   INR 0.94       Objective:   Vitals: BP 139/65    Pulse 73    Temp 98.9 ??F (37.2 ??C) (Oral)    Resp 16    Wt 77 lb 9.6 oz (35.2 kg)    SpO2 98%    BMI 17.40 kg/m??   General appearance: alert and cooperative with exam cachectic  HEENT: Head: Normal, normocephalic, atraumatic.  Neck: no adenopathy, no  carotid bruit, no JVD, supple, symmetrical, trachea midline and thyroid not enlarged, symmetric, no tenderness/mass/nodules  Lungs: clear to auscultation bilaterally  Heart: regular rate and rhythm, S1, S2 normal, no murmur, click, rub or gallop  Abdomen: soft, non-tender; bowel sounds normal; no masses,  no organomegaly  Extremities: extremities normal, atraumatic, no cyanosis or edema  Neurologic: Mental status: alertness: alert    Assessment and Plan:     Patient Active Problem List:     Type II or unspecified type diabetes mellitus without mention of complication, not stated as uncontrolled     Shortness of breath on exertion     Anemia of chronic disease     Kidney problem     CAD (coronary artery disease)     Arthritis     Chest pain     Essential hypertension     PAF (paroxysmal atrial fibrillation) (HCC)     Stage 4 chronic kidney disease (HCC)     ARF (acute renal failure) (HCC)     AKI (acute kidney injury) ([place])     Traumatic seroma of left lower leg     Acute renal failure superimposed on stage 4 chronic kidney disease (HCC)     Uncontrolled type 2 diabetes mellitus (HCC)     Acute on chronic blood loss anemia (HCC)  IMP  AKI on CKD4 improved after dialysis  Uremia better but not resolved  Acidosis better  Anemia - Hb 7.7 post transfusion- adequate iron stores-       Plan   Iron studies adequate  Start ESA  Recheck HB and BMP in  am  Consider GI evaluation for occult bleed or malignancy  No dialysis today  May need another unit of blood if Hb drops below 7  Not ready to go home yet-  May need dialysis again in am        Claretta Fraise, MD

## 2018-02-27 ENCOUNTER — Inpatient Hospital Stay: Admit: 2018-02-27 | Payer: MEDICARE | Primary: Internal Medicine

## 2018-02-27 LAB — CBC
Hematocrit: 19.8 % — ABNORMAL LOW (ref 37–47)
Hemoglobin: 6.2 GM/DL — CL (ref 12.5–16.0)
MCH: 30.4 PG (ref 27–31)
MCHC: 31.3 % — ABNORMAL LOW (ref 32.0–36.0)
MCV: 97.1 FL (ref 78–100)
MPV: 9.9 FL (ref 7.5–11.1)
Platelets: 141 10*3/uL (ref 140–440)
RBC: 2.04 10*6/uL — ABNORMAL LOW (ref 4.2–5.4)
RDW: 19.1 % — ABNORMAL HIGH (ref 11.7–14.9)
WBC: 6.8 10*3/uL (ref 4.0–10.5)

## 2018-02-27 LAB — POCT GLUCOSE
POC Glucose: 133 MG/DL — ABNORMAL HIGH (ref 70–99)
POC Glucose: 113 MG/DL — ABNORMAL HIGH (ref 70–99)
POC Glucose: 91 MG/DL (ref 70–99)
POC Glucose: 184 MG/DL — ABNORMAL HIGH (ref 70–99)
POC Glucose: 122 MG/DL — ABNORMAL HIGH (ref 70–99)

## 2018-02-27 LAB — CRITICAL CARE PANEL
Anion Gap: 14 (ref 4–16)
BUN: 73 MG/DL — ABNORMAL HIGH (ref 6–23)
CO2: 21 MMOL/L (ref 21–32)
Calcium: 7.5 MG/DL — ABNORMAL LOW (ref 8.3–10.6)
Chloride: 102 mMol/L (ref 99–110)
Creatinine: 2.8 MG/DL — ABNORMAL HIGH (ref 0.6–1.1)
GFR African American: 19 mL/min/{1.73_m2} — ABNORMAL LOW (ref >60)
GFR Non-African American: 16 mL/min/{1.73_m2} — ABNORMAL LOW (ref >60)
Glucose: 93 MG/DL (ref 70–99)
Magnesium: 2.4 mg/dl (ref 1.8–2.4)
Phosphorus: 4.8 MG/DL (ref 2.5–4.9)
Potassium: 4 MMOL/L (ref 3.5–5.1)
Sodium: 137 MMOL/L (ref 135–145)

## 2018-02-27 MED ORDER — MIDAZOLAM HCL 2 MG/2ML IJ SOLN
2 | INTRAMUSCULAR | Status: AC
Start: 2018-02-27 — End: 2018-02-27

## 2018-02-27 MED ORDER — OXYCODONE HCL 5 MG PO TABS
5 MG | ORAL | Status: DC | PRN
Start: 2018-02-27 — End: 2018-03-08
  Administered 2018-02-28 – 2018-03-07 (×7): 2.5 mg via ORAL

## 2018-02-27 MED ORDER — SODIUM CHLORIDE 0.9 % IV SOLN
0.9 | INTRAVENOUS | Status: AC
Start: 2018-02-27 — End: 2018-02-27

## 2018-02-27 MED ORDER — ACETAMINOPHEN 325 MG PO TABS
325 MG | ORAL | Status: DC | PRN
Start: 2018-02-27 — End: 2018-03-08
  Administered 2018-02-28 – 2018-03-07 (×3): 650 mg via ORAL

## 2018-02-27 MED ORDER — SODIUM CHLORIDE 0.9 % IV BOLUS
0.9 % | Freq: Once | INTRAVENOUS | Status: AC
Start: 2018-02-27 — End: 2018-03-07
  Administered 2018-03-06: 18:00:00 250 mL via INTRAVENOUS

## 2018-02-27 MED ORDER — NORMAL SALINE FLUSH 0.9 % IV SOLN
0.9 | INTRAVENOUS | Status: AC
Start: 2018-02-27 — End: 2018-02-27

## 2018-02-27 MED ORDER — FENTANYL CITRATE (PF) 100 MCG/2ML IJ SOLN
100 | INTRAMUSCULAR | Status: AC
Start: 2018-02-27 — End: 2018-02-27

## 2018-02-27 MED ORDER — FENTANYL CITRATE (PF) 100 MCG/2ML IJ SOLN
100 MCG/2ML | Freq: Once | INTRAMUSCULAR | Status: AC
Start: 2018-02-27 — End: 2018-02-27
  Administered 2018-02-27: 19:00:00 50 ug via INTRAVENOUS

## 2018-02-27 MED ORDER — HEPARIN SODIUM (PORCINE) 1000 UNIT/ML IJ SOLN
1000 | INTRAMUSCULAR | Status: AC
Start: 2018-02-27 — End: 2018-02-27

## 2018-02-27 MED FILL — ISOSORBIDE DINITRATE 20 MG PO TABS: 20 mg | ORAL | Qty: 2

## 2018-02-27 MED FILL — MIDAZOLAM HCL 2 MG/2ML IJ SOLN: 2 mg/mL | INTRAMUSCULAR | Qty: 2

## 2018-02-27 MED FILL — NORMAL SALINE FLUSH 0.9 % IV SOLN: 0.9 % | INTRAVENOUS | Qty: 100

## 2018-02-27 MED FILL — HUMALOG 100 UNIT/ML SC SOLN: 100 [IU]/mL | SUBCUTANEOUS | Qty: 1

## 2018-02-27 MED FILL — HYDRALAZINE HCL 50 MG PO TABS: 50 mg | ORAL | Qty: 2

## 2018-02-27 MED FILL — NIFEDIPINE ER OSMOTIC RELEASE 30 MG PO TB24: 30 mg | ORAL | Qty: 1

## 2018-02-27 MED FILL — STRESS B-COMPLEX/VIT C/ZINC PO TABS: ORAL | Qty: 1

## 2018-02-27 MED FILL — HEPARIN SODIUM (PORCINE) 5000 UNIT/ML IJ SOLN: 5000 [IU]/mL | INTRAMUSCULAR | Qty: 1

## 2018-02-27 MED FILL — SODIUM CHLORIDE 0.9 % IV SOLN: 0.9 % | INTRAVENOUS | Qty: 100

## 2018-02-27 MED FILL — FENTANYL CITRATE (PF) 100 MCG/2ML IJ SOLN: 100 MCG/2ML | INTRAMUSCULAR | Qty: 2

## 2018-02-27 MED FILL — HEPARIN SODIUM (PORCINE) 1000 UNIT/ML IJ SOLN: 1000 [IU]/mL | INTRAMUSCULAR | Qty: 10

## 2018-02-27 MED FILL — DOK 100 MG PO CAPS: 100 mg | ORAL | Qty: 1

## 2018-02-27 MED FILL — CARVEDILOL 3.125 MG PO TABS: 3.125 mg | ORAL | Qty: 1

## 2018-02-27 NOTE — Progress Notes (Signed)
Patient in dialysis.

## 2018-02-27 NOTE — Progress Notes (Signed)
AM meds held d/t patient going to dialysis.

## 2018-02-27 NOTE — Progress Notes (Signed)
[facility] [address], [place] [address]  Phone: [phone]  Office Hours: 8:30AM - 4:30PM  Monday - Friday     Doing better  Able to eat  Not making much urine  Hb dropped to 6.2  Creat 2.8  Will dialyze her today and transfuse 2 units  Anemia work up per primary team  IR consult for tunneled line

## 2018-02-27 NOTE — Progress Notes (Signed)
To IR via bed.

## 2018-02-27 NOTE — Progress Notes (Signed)
New NPO order noted. Patient already ate a few bites of corn from lunch tray and drank lemonade. Removed tray and informed patient of new NPO order. Questions answered. Call light in reach.

## 2018-02-27 NOTE — Progress Notes (Signed)
Returned to room from IR.

## 2018-02-27 NOTE — Discharge Instructions (Signed)
Dialysis at [facility]  [address] [place] [address]    Chair time. M-W-F 6 am. Pt is to be there at 5:45 am.

## 2018-02-27 NOTE — Progress Notes (Signed)
Dr. Chipper Herb messaged that occult stool positive.

## 2018-02-27 NOTE — Progress Notes (Signed)
To dialysis via bed. Report given to [facility].

## 2018-02-27 NOTE — Discharge Instructions (Signed)
Continuity of Care Form    Patient Name: Judy Velasquez   DOB:  05-30-1929  MRN:  [id number]    Admit date:  02/24/2018  Discharge date:  ***    Code Status Order: Full Code   Advance Directives:     Admitting Physician:  No admitting provider for patient encounter.  PCP: Gladstone Pih, MD    Discharging Nurse: [facility] Unit/Room#: 1129/1129-A  Discharging Unit Phone Number: ***    Emergency Contact:   Extended Emergency Contact Information  Primary Emergency Contact: [facility]  Address: [address], [place] [address] [place]  Home Phone: [phone]  Mobile Phone: [phone]  Relation: Grandchild  Secondary Emergency Contact: Candiss [place]  Home Phone: [phone]  Mobile Phone: [phone]  Relation: Grandchild    Past Surgical History:  Past Surgical History:   Procedure Laterality Date   ??? BREAST LUMPECTOMY  05/20/2011    Right   ??? BREAST SURGERY  7-12    Right Breast Biopsy   ??? CARDIAC SURGERY  2008    CABG (3 Bypasses)   ??? CARPAL TUNNEL RELEASE  2000's    Right   ??? CESAREAN SECTION  "Late 1950's or Early 1960's"    X 3   ??? CHOLECYSTECTOMY, LAPAROSCOPIC  1990's   ??? COLONOSCOPY  2000's   ??? CORONARY ARTERY BYPASS GRAFT  2008    cabg x 3   ??? ENDOSCOPY, COLON, DIAGNOSTIC  1990's   ??? HYSTERECTOMY, TOTAL ABDOMINAL  1960's   ??? JOINT REPLACEMENT  1980's    Total Right Hip   ??? JOINT REPLACEMENT  2000's    Total Right Hip   ??? JOINT REPLACEMENT  1990's    Total Left Hip   ??? JOINT REPLACEMENT  1980's    Total Right Knee   ??? OTHER SURGICAL HISTORY      Family Physician Is Dr. Berneice Heinrich In [place], [place]   ??? OTHER SURGICAL HISTORY  2000's    "Took section of my intestines out, it was a exploratory operation, they found scar tissue from the c-sections"   ??? TONSILLECTOMY AND ADENOIDECTOMY  1939   ??? UPPER GASTROINTESTINAL ENDOSCOPY N/A 09/18/2017    EGD BIOPSY performed by Roylene Reason, MD at [facility]       Immunization  History:   There is no immunization history for the selected administration types on file for this patient.    Active Problems:  Patient Active Problem List   Diagnosis Code   ??? Type II or unspecified type diabetes mellitus without mention of complication, not stated as uncontrolled E11.9   ??? Shortness of breath on exertion R06.02   ??? Anemia of chronic disease D63.8   ??? Kidney problem N28.9   ??? CAD (coronary artery disease) I25.10   ??? Arthritis M19.90   ??? Chest pain R07.9   ??? Essential hypertension I10   ??? PAF (paroxysmal atrial fibrillation) (HCC) I48.0   ??? Stage 4 chronic kidney disease (HCC) N18.4   ??? ARF (acute renal failure) (HCC) N17.9   ??? AKI (acute kidney injury) (HCC) N17.9   ??? Traumatic seroma of left lower leg S80.12XA   ??? Acute renal failure superimposed on stage 4 chronic kidney disease (HCC) N17.9, N18.4   ??? Uncontrolled type 2 diabetes mellitus (HCC) E11.65   ??? Acute on chronic blood loss anemia (HCC) D60.0   ???  Anemia due to chronic kidney disease N18.9, D63.1       Isolation/Infection:   Isolation          No Isolation            Nurse Assessment:  Last Vital Signs: BP 133/63    Pulse 80    Temp 97.2 ??F (36.2 ??C) (Oral)    Resp 16    Wt 67 lb 3.8 oz (30.5 kg)    SpO2 96%    BMI 15.07 kg/m??     Last documented pain score (0-10 scale): Pain Level: 6  Last Weight:   Wt Readings from Last 1 Encounters:   02/27/18 67 lb 3.8 oz (30.5 kg)     Mental Status:  {IP PT MENTAL STATUS:20030}    IV Access:  {MH COC IV ACCESS:304088262}    Nursing Mobility/ADLs:  Walking   {CHP DME UJWJ:191478295}  Transfer  {CHP DME AOZH:086578469}  Bathing  {CHP DME GEXB:284132440}  Dressing  {CHP DME NUUV:253664403}  Toileting  {CHP DME KVQQ:595638756}  Feeding  {CHP DME EPPI:951884166}  Med Admin  {CHP DME AYTK:160109323}  Med Delivery   {MH COC MED Delivery:304088264}    Wound Care Documentation and Therapy:        Elimination:  Continence:   ?? Bowel: {YES / FT:73220}  ?? Bladder: {YES / UR:42706}  Urinary Catheter: {Urinary  Catheter:304088013}   Colostomy/Ileostomy/Ileal Conduit: {YES / CB:76283}       Date of Last BM: ***  No intake or output data in the 24 hours ending 02/27/18 1906  No intake/output data recorded.    Safety Concerns:     {MH COC Safety Concerns:304088272}    Impairments/Disabilities:      {MH COC Impairments/Disabilities:304088273}    Nutrition Therapy:  Current Nutrition Therapy:   {MH COC Diet List:304088271}    Routes of Feeding: {CHP DME Other Feedings:304088042}  Liquids: {Slp liquid thickness:30034}  Daily Fluid Restriction: {CHP DME Yes amt example:304088041}  Last Modified Barium Swallow with Video (Video Swallowing Test): {Done Not Done TDVV:616073710}    Treatments at the Time of Hospital Discharge:   Respiratory Treatments: ***  Oxygen Therapy:  {Therapy; copd oxygen:17808}  Ventilator:    {MH CC Vent GYIR:485462703}    Rehab Therapies: {THERAPEUTIC INTERVENTION:7694957828}  Weight Bearing Status/Restrictions: {MH CC Weight Bearing:304508812}  Other Medical Equipment (for information only, NOT a DME order):  {EQUIPMENT:304520077}  Other Treatments: ***    Patient's personal belongings (please select all that are sent with patient):  {CHP DME Belongings:304088044}    RN SIGNATURE:  {Esignature:304088025}    CASE MANAGEMENT/SOCIAL WORK SECTION    Inpatient Status Date: ***    Readmission Risk Assessment Score:  Readmission Risk              Risk of Unplanned Readmission:        25           Discharging to Facility/ Agency   ?? Name:   ?? Address:  ?? Phone:  ?? Fax:    Dialysis Facility (if applicable)   ?? Name:  ?? Address:  ?? Dialysis Schedule:  ?? Phone:  ?? Fax:    Case Manager/Social Worker signature: {Esignature:304088025}    PHYSICIAN SECTION    Prognosis: {Prognosis:7324467763}    Condition at Discharge: {MH Patient Condition:304088024}    Rehab Potential (if transferring to Rehab): {Prognosis:7324467763}    Recommended Labs or Other Treatments After Discharge: ***    Physician Certification: I certify the  above information and  transfer of Judy Velasquez  is necessary for the continuing treatment of the diagnosis listed and that she requires {Admit to Appropriate Level of Care:20763} for {GREATER/LESS:304500278} 30 days.     Update Admission H&P: {CHP DME Changes in WCHEN:277824235}    PHYSICIAN SIGNATURE:  {Esignature:304088025}

## 2018-02-27 NOTE — Progress Notes (Signed)
[facility] [address] [place] [address]  Phone: [phone]  Office Hours: 8:30AM - 4:30PM  Monday - Friday      Nephrology  Dialysis Note        PROCEDURE:  Patient seen  for hemodialysis      PHYSICIAN:  PK      INDICATION:  Acute renal failure      RX:  See dialysis flowsheet for specifics on access, blood flow rate, dialysate baths, duration of dialysis, anticoagulation and other technical information.      COMMENTS:  dialysis did not start until  8 pm although ordered in the morning to be transfused with dialysis

## 2018-02-27 NOTE — Care Coordination-Inpatient (Signed)
Message from [place] at [facility] [phone] ext. [phone]).     11:00 - return call to [facility]. Spoke with a Gerald Stabs. Pt has been approved for dialysis at [facility] in [place]. Pt had a chair time of M-W-F 6 am with a arrival time of 5:45. If Pt is discharged over the weekend, Pt can start on Monday March 02, 2018. Call to [facility], Pt grandchild. Pt lives with [place]. Audelia Acton will be providing the transportation to Dialysis. Notified [name] of the anticipated chair time. LSW to put information on the discharge paperwork.     Electronically signed by Bunnie Pion, LSW on 02/27/2018 at 11:30 AM

## 2018-02-27 NOTE — Progress Notes (Signed)
Dr. Wallis Bamberg aware of Hgb 6.2. See new orders. Per him, PRBC to be transfused during dialysis. AM assessment completed. Patient sitting up in chair. Encouraged patient to get back in bed as coccyx is reddened. Patient assisted back to bed. States turns self. Assisted patient on to side and elevated on pillow to relieve pressure on coccyx. Denies pain or other needs. Call light in reach.

## 2018-02-27 NOTE — Progress Notes (Signed)
Progress Note( Dr. Loel Dubonnet)  02/27/2018  Subjective:   Admit Date: 02/24/2018  PCP: [name], MD    Admitted For : Severe anemia with hemoglobin meal was 3+    Consulted For: Better control of blood glucose    Interval History: Patient is much better more alert and awake have normal communications    Denies any chest pains,   Mild to moderate SOB .   Denies nausea or vomiting.   No new bowel or bladder symptoms.       Intake/Output Summary (Last 24 hours) at 02/27/2018 0658  Last data filed at 02/26/2018 1045  Gross per 24 hour   Intake 360 ml   Output --   Net 360 ml       DATA    CBC:   Recent Labs     02/25/18  0502 02/25/18  1853 02/27/18  0611   WBC 8.5 7.8 6.8   HGB 7.7* 7.7* 6.2  HGB CALLED TO MELINDA BOVEY RN ON 02/27/18 AT 0635 BY B BAYLESS CLS  HGB CALLED TO MELINDA BOVEY RN ON 02/27/18 AT 0635 BY B BAYLESS CLS  RESULTS READ BACK  *   PLT 143 141 141    CMP:  Recent Labs     02/24/18  1152 02/25/18  0502 02/26/18  0656 02/27/18  0611   NA 136 137 142 137   K 4.6 3.9 3.7 4.0   CL 96* 103 105 102   CO2 23 23 24 21    BUN 150* 49* 63* 73*   CREATININE 3.8* 1.8* 2.4* 2.8*   CALCIUM 7.8* 7.4* 7.7* 7.5*   PROT 5.1* 5.0*  --   --    LABALBU 3.3* 3.2*  --   --    BILITOT 0.3 0.9  --   --    ALKPHOS 46 48  --   --    AST 9* 13*  --   --    ALT <5* <5*  --   --      Lipids: No results found for: CHOL, HDL, TRIG  Glucose:  Recent Labs     02/26/18  1734 02/26/18  2110 02/27/18  0221   POCGLU 101* 133* 113*     HemoglobinA1C:  Lab Results   Component Value Date    LABA1C 5.0 02/25/2018     High Sensitivity TSH: No results found for: TSHHS  Free T3: No results found for: FT3  Free T4:No results found for: T4FREE    Xr Chest Portable    Result Date: 02/24/2018  EXAMINATION: ONE XRAY VIEW OF THE CHEST 02/24/2018 2:37 pm COMPARISON: Chest x-ray August 18, 2017 HISTORY: ORDERING SYSTEM PROVIDED HISTORY: post cvc      1. Right IJ central venous catheter tip at the level of the cavoatrial junction.  No pneumothorax identified.        Scheduled Medicines   Medications:   ??? sodium chloride  250 mL Intravenous Once   ??? darbepoetin alfa-polysorbate  60 mcg Subcutaneous Weekly - Thursday   ??? insulin lispro  0-6 Units Subcutaneous TID WC   ??? insulin lispro  0-3 Units Subcutaneous 2 times per day   ??? 0.9 % sodium chloride  250 mL Intravenous Once   ??? sodium chloride flush  10 mL Intravenous 2 times per day   ??? heparin (porcine)  5,000 Units Subcutaneous 3 times per day   ??? carvedilol  3.125 mg Oral BID   ??? docusate sodium  100  mg Oral BID   ??? fluticasone  1 spray Nasal Daily   ??? furosemide  40 mg Oral BID   ??? hydrALAZINE  100 mg Oral TID   ??? isosorbide dinitrate  30 mg Oral TID   ??? NIFEdipine  30 mg Oral BID   ??? b complex-C-E-zinc  1 tablet Oral Daily      Infusions:   ??? dextrose           Objective:   Vitals: BP (!) 141/88    Pulse 73    Temp 98.7 ??F (37.1 ??C) (Oral)    Resp 17    Wt 67 lb 3.8 oz (30.5 kg)    SpO2 98%    BMI 15.07 kg/Judy Velasquez??   General appearance: alert and cooperative with exam  Neck: no JVD or bruit  Thyroid : Normal lobes   Lungs: Has Vesicular Breath sounds   Heart:  regular rate and rhythm  Abdomen: soft, non-tender; bowel sounds normal; no masses,  no organomegaly  Musculoskeletal: Normal  Extremities: extremities normal, , no edema  Neurologic:  Awake, alert, oriented to name, place and time.  Cranial nerves II-XII are grossly intact.  Motor is  intact.  Sensory is intact.,  and gait is abnormal.  And unstable    Assessment:     Patient Active Problem List:     Type II or unspecified type diabetes mellitus without mention of complication, not stated as uncontrolled     Shortness of breath on exertion     Anemia of chronic disease     CAD (coronary artery disease)     Arthritis     Essential hypertension     PAF (paroxysmal atrial fibrillation) (HCC)     Stage 4 chronic kidney disease (HCC)     ARF (acute renal failure) (HCC)     Traumatic seroma of left lower leg     Acute renal failure superimposed on stage 4 chronic kidney  disease ([place])     Acute on chronic blood loss anemia ([place])      Plan:     1. Reviewed POC blood glucose . Labs and X ray results   2. Reviewed Current Medicines   3. On  Correction bolus Humalog Insulin regime    4. Monitor Blood glucose frequently   5. Modified  the dose of Insulin/ other medicines as needed   6. On his scheduled hemodialysis  7. Will follow     .     [name], MD

## 2018-02-27 NOTE — Consults (Signed)
CONSULTATION  ??  PATIENT NAME: Judy Velasquez, Judy Velasquez                       DOB:        [birth year]  MED REC NO:   [id number]                          ROOM:       [id number]  ACCOUNT NO:   [id number]                           ADMIT DATE: 02/24/2018  PROVIDER:     Barbette Merino, MD  ??  CONSULT DATE:  02/27/2018  ??  REFERRING PHYSICIAN:  Dr. Franki Cabot, the hospitalist.    Primary gastroenterologist:  Dr. Edwena Felty  ??  REASON FOR CONSULTATION:  Anemia.  ??  HISTORY OF PRESENT ILLNESS:  An 84 year old Caucasian female patient, who has past medical history of end-stage renal disease, on hemodialysis, heart attack, hypercholesterolemia, hypertension, chronic anemia, on iron supplements; anxiety, arthritis, tuberculosis, diabetes, chronic nausea and vomiting, IBS, and duodenal ulcer, presented with acute renal insufficiency on top of chronic renal insufficiency and severe anemia, 3 days ago.  The patient had undergone hemodialysis.  On admission, the patient's hemoglobin was around 3.  The patient received 2 units of blood transfusion and hemoglobin had bumped up to 9.2 and then the following 2 days, the patient's hemoglobin was around 7.7;  however, today the patient's hemoglobin was 6.2.  On 06/04, the patient's iron study showed iron was 109 and iron saturation was 56% and TIBC was 194.  The patient reported that at home, she always has black stool over the past 5 years because she was taking iron supplements.   The patient moves the bowels once every 2 to 3 days.  Stool is formed.  There was no diarrhea or constipation.  The patient had mild nausea and nonbloody emesis; however, the patient denied vomiting over the past 48 hours.  The patient denied heartburn, regurgitation, dysphagia, or odynophagia.  The patient denied abdominal bloating, abdominal girth increase, and abdominal gas sensations.  The patient denied heartburn, acid reflux, dysphagia, or odynophagia.  The patient  denied weight change recently.  The patient did have a remote history of colonoscopy, but the patient did not remember the result.  The patient had an EGD on 09/18/2017 because of odynophagia.  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  The patient denied taking any blood thinners at home.           PMH:    Past Medical History:   Diagnosis Date   ??? Allergic rhinitis    ??? Anemia    ??? Anesthesia     Nausea/Vomiting Post Op In Past   ??? Anxiety    ??? Arthritis     s/p bilateral hip and right knee replacement sees Dr. Chriss Czar   ??? Back problem     "Occ Back Hurts"   ??? Blood transfusion    ??? CAD (coronary artery disease) 2008    Sees Dr. Brandt Loosen CABG x 3   ??? DCIS (ductal carcinoma in situ) 05/20/2011   ??? Glaucoma     Bilateral Eyes   ??? H/O cardiovascular stress test  05/20/11   ??? Heart attack ([place]) 2006   ??? HOH (hard of hearing)     Bilateral Ears   ??? Hyperlipidemia    ??? Hypertension    ??? IBS (irritable bowel syndrome)    ??? Kidney problem     "Sees  Dr. Maretta Bees For Blood Pressure And Kidneys"   ??? Nausea & vomiting     Nausea/Vomiting Post Op In Past   ??? Osteoarthritis    ??? Osteoporosis    ??? Right Breast Cancer Dx 7-12   ??? Shortness of breath on exertion    ??? Staph infection 1980's    "They sent me to a tropical infection doctor"   ??? TB (tuberculosis)     "Probably had it as a child, my dad had it"   ??? Type II or unspecified type diabetes mellitus without mention of complication, not stated as uncontrolled Dx 1980's       PSH:    Past Surgical History:   Procedure Laterality Date   ??? BREAST LUMPECTOMY  05/20/2011    Right   ??? BREAST SURGERY  7-12    Right Breast Biopsy   ??? CARDIAC SURGERY  2008    CABG (3 Bypasses)   ??? CARPAL TUNNEL RELEASE  2000's    Right   ??? CESAREAN SECTION  "Late 1950's or Early 1960's"    X 3   ??? CHOLECYSTECTOMY,  LAPAROSCOPIC  1990's   ??? COLONOSCOPY  2000's   ??? CORONARY ARTERY BYPASS GRAFT  2008    cabg x 3   ??? ENDOSCOPY, COLON, DIAGNOSTIC  1990's   ??? HYSTERECTOMY, TOTAL ABDOMINAL  1960's   ??? JOINT REPLACEMENT  1980's    Total Right Hip   ??? JOINT REPLACEMENT  2000's    Total Right Hip   ??? JOINT REPLACEMENT  1990's    Total Left Hip   ??? JOINT REPLACEMENT  1980's    Total Right Knee   ??? OTHER SURGICAL HISTORY      Family Physician Is Dr. Evelina Bucy In [place], [place]   ??? OTHER SURGICAL HISTORY  2000's    "Took section of my intestines out, it was a exploratory operation, they found scar tissue from the c-sections"   ??? [place]   ??? UPPER GASTROINTESTINAL ENDOSCOPY N/A 09/18/2017    EGD BIOPSY performed by [name], MD at [facility] ENDOSCOPY       Medications:    No current facility-administered medications on file prior to encounter.      Current Outpatient Medications on File Prior to Encounter   Medication Sig Dispense Refill   ??? HYDROcodone-acetaminophen (NORCO) 5-325 MG per tablet Take 1 tablet by mouth every 6 hours as needed for Pain.     ??? furosemide (LASIX) 40 MG tablet Take 40 mg by mouth 2 times daily     ??? isosorbide dinitrate (ISORDIL) 30 MG tablet Take 1 tablet by mouth 3 times daily 120 tablet 3   ??? hydrALAZINE (APRESOLINE) 100 MG tablet Take 1 tablet by mouth 3 times daily 90 tablet 3   ??? carvedilol (COREG) 3.125 MG tablet Take 1 tablet by mouth 2 times daily 60 tablet 1   ??? NIFEdipine (ADALAT CC) 30 MG extended release tablet Take 1 tablet by mouth 2 times daily 60 tablet 3   ??? docusate sodium (PHILLIPS STOOL SOFTENER) 100 MG capsule Take 1 capsule by mouth 2 times daily 60 capsule 1   ??? B Complex-Biotin-FA (  SUPER B-50 B COMPLEX PO) Take by mouth     ??? fluticasone (FLONASE) 50 MCG/ACT nasal spray 1 spray by Nasal route daily         Allergies:   Allergies   Allergen Reactions   ??? Celebrex [Celecoxib] Shortness Of Breath, Swelling and Anaphylaxis     "Swelling Of The Esophagus"   ???  Fish Allergy Anaphylaxis   ??? Lisinopril Other (See Comments)     Dry Mouth   ??? Neurontin [Gabapentin] Other (See Comments)     Dizziness   ??? Norvasc [Amlodipine Besylate] Other (See Comments)     Edema Of Legs   ??? Other      "Allergic To Certain Seafoods Causing Headaches, Difficulty Breathing"   ??? Shellfish Allergy Anaphylaxis   ??? Ciprofloxacin Rash   ??? Lipitor Other (See Comments)     Ache   ??? Losartan      Per nephrologist       Social History:    Social History     Socioeconomic History   ??? Marital status: Widowed     Spouse name: Not on file   ??? Number of children: Not on file   ??? Years of education: Not on file   ??? Highest education level: Not on file   Occupational History   ??? Not on file   Social Needs   ??? Financial resource strain: Not on file   ??? Food insecurity:     Worry: Not on file     Inability: Not on file   ??? Transportation needs:     Medical: Not on file     Non-medical: Not on file   Tobacco Use   ??? Smoking status: Never Smoker   ??? Smokeless tobacco: Never Used   Substance and Sexual Activity   ??? Alcohol use: No   ??? Drug use: No   ??? Sexual activity: Never   Lifestyle   ??? Physical activity:     Days per week: Not on file     Minutes per session: Not on file   ??? Stress: Not on file   Relationships   ??? Social connections:     Talks on phone: Not on file     Gets together: Not on file     Attends religious service: Not on file     Active member of club or organization: Not on file     Attends meetings of clubs or organizations: Not on file     Relationship status: Not on file   ??? Intimate partner violence:     Fear of current or ex partner: Not on file     Emotionally abused: Not on file     Physically abused: Not on file     Forced sexual activity: Not on file   Other Topics Concern   ??? Not on file   Social History Narrative    Do you donate blood or plasma? No    Caffeine intake? Moderate    Advance directive? No    Is blood transfusion acceptable in an emergency? Yes    Live alone or with others?  With others    Sunscreen used routinely? No able to care for self? Yes               Family History:        Problem Relation Age of Onset   ??? Heart Disease Mother    ??? Cancer Mother         "  Cancer In Her Eye"   ??? Depression Mother    ??? Mental Illness Mother    ??? Diabetes Father    ??? Heart Disease Father         "Heart Attack"   ??? Hearing Loss Father    ??? Cancer Sister         "Breast Cancer"   ??? Early Death Brother         "At [employer]"   ??? Diabetes Sister    ??? Vision Loss Son         "Eye Problems"   ??? Other Son         "Alot of health problems due to motorcycle wreck"   ??? Arthritis Daughter    ??? Early Death Son         "Early 38's"         Review of Systems:  Constitutional: No weight loss, no fevers.  Eyes: No problems with vision.  ENT: No nose or sinus problems, no oral problems, no throat problems or hoarseness.  Cardiovascular: No chest pain, no leg pain with walking, no palpitations, no ankle swelling.  Respiratory: No shortness of breath, no persistent cough, no wheezing.  Endocrine: No increased thirst, no increased urination.  Gastrointestinal: No heartburn, no dysphagia, no abdominal pain, no loss of appetite, no nausea or vomiting, no diarrhea, no constipation, no melena, no hematochezia, no sceleral icterus or jaundice.  Skin: No rashes.  Musculoskeletal: No trouble walking or standing, no joint pain, no muscle pain.  Allergy/Immune System: No allergies, no frequent infections.  Neurological: No memory difficulties, no temporary blindness, no difficulty speaking, no headaches, no numbness.  Psychiatric: No depression, no suicidal ideation, no auditory hallucinations.  Hematological/Lymphatic: No lymphadenopathy, no frequent nose bleeds, no easy bruising.  Genitourinary: No penile/vaginal discharge, no pain with urination, no trouble starting urinary stream, no hematuria.    Physical Examination  Vital Signs: BP (!) 125/56    Pulse 69    Temp 98.4 ??F (36.9 ??C) (Oral)    Resp 16    Wt 67 lb 3.8 oz (30.5  kg)    SpO2 98%    BMI 15.07 kg/m??  Body mass index is 15.07 kg/m??.    General: The patient is a 84 y.o. female in No acute distress.  EYE: EOMI, Gross visual field was normal. Pupils reactive, The conjunctive was normal, with no erythema.  ENT: no lymphadenopathy, oropharynx is without erythema, edema, or exudates, and moist mucus membranes. The nasal mucosa, septum and turbinates were normal without inflammation or edema.  Neck: There was no mass on palpitation, tracheal position was in the middle of the neck and there was no enlarged thyroid.  There was no JVD.  Lungs: The respiratory was not in labor and the patient did not use accessory muscle. Clear to auscultation bilaterally, no wheeze/crackles.  Cardiovascular: Regular rate and rhythm, normal S1 & S2, no murmurs, rubs or gallops appreciated. Peripheral pulses were normal and no tenderness.  Abdomen: Soft, non-tender, no rebound or guarding or peritoneal features, no masses, no hepatosplenomegaly.  Extremities: upper and lower extremities were warm and dry, no clubbing, cyanosis, edema.  Neuro: CN II-XII were intact grossly. Sensation was normal on all extremities and the muscle strength was normal and symmetry.  Rectum: There was no fistular, fissure, external hemorrhoid, tenderness, abscess, erythema or discharge. There was "black stool" in the rectum.    Labs:  CBC  WBC   Date Value Ref Range Status  02/27/2018 6.8 4.0 - 10.5 K/CU MM Final     Hemoglobin   Date Value Ref Range Status   02/27/2018 (LL) 12.5 - 16.0 GM/DL Final    6.2  HGB CALLED TO MELINDA BOVEY RN ON 02/27/18 AT 0635 BY B BAYL[facility] CLS  HGB CALLED TO MELINDA BOVEY RN ON 02/27/18 AT 0635 BY B BAYLESS CLS  RESULTS READ BACK       Hematocrit   Date Value Ref Range Status   02/27/2018 19.8 (L) 37 - 47 % Final     MCV   Date Value Ref Range Status   02/27/2018 97.1 78 - 100 FL Final        Glucose   Date Value Ref Range Status   02/27/2018 93 70 - 99 MG/DL Final     CO2   Date Value Ref Range Status    02/27/2018 21 21 - 32 MMOL/L Final     BUN   Date Value Ref Range Status   02/27/2018 73 (H) 6 - 23 MG/DL Final     Lab Results   Component Value Date    ALT <5 02/25/2018    AST 13 02/25/2018     No results found for: AMYLASE  Lab Results   Component Value Date    LIPASE 4 09/15/2017     Lab Results   Component Value Date    ESR 11 07/03/2009     No components found for: CREACTIVEPR  No results found for: ANA No components found for: ANTISMA, ANTIMITO  No results found for: CEA  No components found for: Laurine Blazer, OCCULTBLOODS, OCCBLD1, OCCBLD2, OCCBLD3, OCCULTBLOOD  Lab Results   Component Value Date    IRON 29 02/26/2018    FERRITIN 654 02/26/2018     No results found for: HAV    Assessment     Assessment and Plan:     An 84 year old Caucasian female patient, who has past medical history of end-stage renal disease, on hemodialysis;  chronic nausea and vomiting, breast cancer, tuberculosis, heart attack, glaucoma, Crohn's anemia, on iron supplements; anxiety, and arthritis, presented with acute renal insufficiency and severe anemia 3 days ago.   After blood transfusion, the patient's hemoglobin and hematocrit had appropriately bumped up; however, today the patient's hemoglobin again dropped to 6 from 7.7.  The patient did have black stool chronically because of taking iron supplements.  The patient denied hematochezia, melena, hematemesis, or coffee-ground emesis.  The patient did not have other GI symptoms.  ??  1.  Anemia, might be multifactorial anemia such as chronic disease anemia, iron-deficiency anemia, chronic renal disease or malnutrition.  However, we need to rule out GI bleeding.  The patient always had black stool due to iron supplements and rectal exams also showed a black stool, but the stool was not tarry; therefore, I ordered a stool guaiac test.  Also, because the patient did have history of duodenal ulcer in 2018, I will plan to do an EGD tomorrow.  If the EGD was not revealing,  the patient might need a colonoscopy.  Please transfuse the patient per primary team's suggestion.    2.  I would also suggest Hematology consultation for chronic anemia if we did not find GI bleeding.    3.  Chronic renal insufficiency, on hemodialysis.  The patient will receive hemodialysis today.  ??  Thank you very much for you referral.        Barbette Merino MD PhD [facility]  [address] [address]  59fice: [phone] Fax: [phone]

## 2018-02-27 NOTE — Progress Notes (Signed)
Hospitalist Progress Note      Name:  Judy Velasquez DOB/Age/Sex: 05/24/1929  (84 y.o. female)   MRN & CSN:  [id number] & [id number] Admission Date/Time: 02/24/2018  9:52 AM   Location:  1129/1129-A PCP: [name], MD         Hospital Day: 4    Assessment and Plan:   Judy Velasquez is a 84 y.o.  female  who presents with Acute renal failure superimposed on stage 4 chronic kidney disease ([place])    AKI on CKD stage 4  Evaluated by nephrology  Tunneled cath placed today  Will have HD today  Daily weights  I/O    Acute on chronic anemia secondary to blood loss  Likely UGIB  Hgb 6.8    Plan to transfuse today with HD  GI consult  Dc heparin    DM   Cont sliding scale insulin  CB monitoring    CAD  PAF  hyperlipidemia  hypertension  Cont home meds      Diet Diet NPO Effective Now   DVT Prophylaxis []  Lovenox, []   Heparin, []  SCDs, [] No VTE prophylaxis, patient ambulating   GI Prophylaxis []  PPI, []  H2 Blocker, []  No GI prophylaxis, patient is receiving diet/Tube Feeds   Code Status Full Code             History of Present Illness:     Pt S&E. Complaining of nausea, and pain at site of tunneled cath, denies chest pain, SOB, or any other complaints    Ten point ROS reviewed negative, unless as noted above    Objective:   No intake or output data in the 24 hours ending 02/27/18 1645   Vitals:   Vitals:    02/27/18 1100   BP: (!) 125/56   Pulse: 69   Resp: 16   Temp: 98.4 ??F (36.9 ??C)   SpO2: 98%     Physical Exam:    GEN Awake female, in no apparent distress.  RESP Clear to auscultation, no wheezes, rales or rhonchi.  Symmetric chest movement while on room air.  CARDIO/VASC S1/S2 auscultated. Regular rate without appreciable murmurs, rubs, or gallops. No peripheral edema.  GI Abdomen is soft without significant tenderness, masses, or guarding. Bowel sounds are normoactive.  MSK No gross joint deformities. Spontaneous movement of all extremities  SKIN Normal coloration, warm, dry.  NEURO Cranial nerves appear grossly  intact, normal speech, no lateralizing weakness.  PSYCH Awake, alert,Affect appropriate.    Medications:   Medications:   ??? sodium chloride  250 mL Intravenous Once   ??? darbepoetin alfa-polysorbate  60 mcg Subcutaneous Weekly - Thursday   ??? insulin lispro  0-6 Units Subcutaneous TID WC   ??? insulin lispro  0-3 Units Subcutaneous 2 times per day   ??? 0.9 % sodium chloride  250 mL Intravenous Once   ??? sodium chloride flush  10 mL Intravenous 2 times per day   ??? heparin (porcine)  5,000 Units Subcutaneous 3 times per day   ??? carvedilol  3.125 mg Oral BID   ??? docusate sodium  100 mg Oral BID   ??? fluticasone  1 spray Nasal Daily   ??? furosemide  40 mg Oral BID   ??? hydrALAZINE  100 mg Oral TID   ??? isosorbide dinitrate  30 mg Oral TID   ??? NIFEdipine  30 mg Oral BID   ??? b complex-C-E-zinc  1 tablet Oral Daily      Infusions:   ???  dextrose       PRN Meds:   acetaminophen 650 mg Q4H PRN   oxyCODONE 2.5 mg Q4H PRN   sodium chloride flush 10 mL PRN   ondansetron 4 mg Q6H PRN   glucose 15 g PRN   dextrose 12.5 g PRN   glucagon (rDNA) 1 mg PRN   dextrose 100 mL/hr PRN         Electronically signed by Renard Matter, MD on 02/27/2018 at 4:45 PM

## 2018-02-28 LAB — CRITICAL CARE PANEL
Anion Gap: 10 (ref 4–16)
BUN: 23 MG/DL (ref 6–23)
CO2: 23 MMOL/L (ref 21–32)
Calcium: 8.6 MG/DL (ref 8.3–10.6)
Chloride: 104 mMol/L (ref 99–110)
Creatinine: 1.1 MG/DL (ref 0.6–1.1)
GFR African American: 57 mL/min/{1.73_m2} — ABNORMAL LOW (ref >60)
GFR Non-African American: 47 mL/min/{1.73_m2} — ABNORMAL LOW (ref >60)
Glucose: 151 MG/DL — ABNORMAL HIGH (ref 70–99)
Magnesium: 2 mg/dl (ref 1.8–2.4)
Phosphorus: 2.4 MG/DL — ABNORMAL LOW (ref 2.5–4.9)
Potassium: 4.1 MMOL/L (ref 3.5–5.1)
Sodium: 137 MMOL/L (ref 135–145)

## 2018-02-28 LAB — POCT GLUCOSE
POC Glucose: 92 MG/DL (ref 70–99)
POC Glucose: 154 MG/DL — ABNORMAL HIGH (ref 70–99)
POC Glucose: 146 MG/DL — ABNORMAL HIGH (ref 70–99)
POC Glucose: 77 MG/DL (ref 70–99)

## 2018-02-28 LAB — HEMOGLOBIN AND HEMATOCRIT
Hematocrit: 31.8 % — ABNORMAL LOW (ref 37–47)
Hematocrit: 29 % — ABNORMAL LOW (ref 37–47)
Hemoglobin: 10.6 GM/DL — ABNORMAL LOW (ref 12.5–16.0)
Hemoglobin: 9.2 GM/DL — ABNORMAL LOW (ref 12.5–16.0)

## 2018-02-28 LAB — TYPE AND SCREEN
ABO/Rh: B POS
Antibody Screen: POSITIVE
Antigen Data: NEGATIVE
Unit Divison: 0
Unit Divison: 0
Unit Divison: 0
Unit Divison: 0

## 2018-02-28 MED ORDER — PROPOFOL 200 MG/20ML IV EMUL
200 MG/20ML | INTRAVENOUS | Status: DC | PRN
Start: 2018-02-28 — End: 2018-02-28
  Administered 2018-02-28 (×2): 30 via INTRAVENOUS

## 2018-02-28 MED ORDER — HEPARIN SODIUM (PORCINE) 1000 UNIT/ML IJ SOLN
1000 UNIT/ML | Freq: Once | INTRAMUSCULAR | Status: AC
Start: 2018-02-28 — End: 2018-02-27
  Administered 2018-02-28: 03:00:00 3500 [IU] via INTRAVENOUS

## 2018-02-28 MED ORDER — SODIUM CHLORIDE 0.9 % IV SOLN
0.9 % | Freq: Once | INTRAVENOUS | Status: AC
Start: 2018-02-28 — End: 2018-02-28
  Administered 2018-02-28: 13:00:00 via INTRAVENOUS

## 2018-02-28 MED ORDER — LIDOCAINE HCL 2 % IJ SOLN
2 % | INTRAMUSCULAR | Status: DC | PRN
Start: 2018-02-28 — End: 2018-02-28
  Administered 2018-02-28: 14:00:00 100 via INTRAVENOUS

## 2018-02-28 MED ORDER — SODIUM CHLORIDE 0.9 % IV SOLN
0.9 | INTRAVENOUS | Status: AC
Start: 2018-02-28 — End: 2018-02-28

## 2018-02-28 MED ORDER — FAMOTIDINE 20 MG/2ML IV SOLN
20 MG/2ML | Freq: Two times a day (BID) | INTRAVENOUS | Status: DC
Start: 2018-02-28 — End: 2018-03-01
  Administered 2018-03-01 (×2): 20 mg via INTRAVENOUS

## 2018-02-28 MED ORDER — PROPOFOL 200 MG/20ML IV EMUL
200 | INTRAVENOUS | Status: AC
Start: 2018-02-28 — End: 2018-02-28

## 2018-02-28 MED ORDER — LIDOCAINE HCL (CARDIAC) PF 100 MG/5ML IV SOLN
100 | INTRAVENOUS | Status: AC
Start: 2018-02-28 — End: 2018-02-28

## 2018-02-28 MED ORDER — SODIUM CHLORIDE 0.9 % IV SOLN
0.9 % | INTRAVENOUS | Status: DC | PRN
Start: 2018-02-28 — End: 2018-02-28
  Administered 2018-02-28: 13:00:00 via INTRAVENOUS

## 2018-02-28 MED FILL — HEPARIN SODIUM (PORCINE) 1000 UNIT/ML IJ SOLN: 1000 [IU]/mL | INTRAMUSCULAR | Qty: 4

## 2018-02-28 MED FILL — FUROSEMIDE 40 MG PO TABS: 40 mg | ORAL | Qty: 1

## 2018-02-28 MED FILL — DOK 100 MG PO CAPS: 100 mg | ORAL | Qty: 1

## 2018-02-28 MED FILL — LIDOCAINE HCL (CARDIAC) PF 100 MG/5ML IV SOLN: 100 MG/5ML | INTRAVENOUS | Qty: 5

## 2018-02-28 MED FILL — SODIUM CHLORIDE 0.9 % IV SOLN: 0.9 % | INTRAVENOUS | Qty: 500

## 2018-02-28 MED FILL — CARVEDILOL 3.125 MG PO TABS: 3.125 mg | ORAL | Qty: 1

## 2018-02-28 MED FILL — NIFEDIPINE ER OSMOTIC RELEASE 30 MG PO TB24: 30 mg | ORAL | Qty: 1

## 2018-02-28 MED FILL — ISOSORBIDE DINITRATE 20 MG PO TABS: 20 mg | ORAL | Qty: 2

## 2018-02-28 MED FILL — HYDRALAZINE HCL 50 MG PO TABS: 50 mg | ORAL | Qty: 2

## 2018-02-28 MED FILL — OXYCODONE HCL 5 MG PO TABS: 5 mg | ORAL | Qty: 1

## 2018-02-28 MED FILL — STRESS B-COMPLEX/VIT C/ZINC PO TABS: ORAL | Qty: 1

## 2018-02-28 MED FILL — PAIN & FEVER 325 MG PO TABS: 325 mg | ORAL | Qty: 2

## 2018-02-28 MED FILL — PROPOFOL 200 MG/20ML IV EMUL: 200 MG/20ML | INTRAVENOUS | Qty: 20

## 2018-02-28 NOTE — Progress Notes (Signed)
Judy Velasquez [address] [place] [address]  Phone: [phone]  Office Hours: 8:30AM - 4:30PM  Monday - Friday     Nephrology Progress Note  02/28/2018 11:36 AM  Subjective:   Admit Date: 02/24/2018  PCP: [name], MD  Interval History:  Nausea better  Had EGD  Appetite poor  Making a little more urine  Diet: Diet NPO, After Midnight      Data:   Scheduled Meds:  ??? sodium chloride  250 mL Intravenous Once   ??? darbepoetin alfa-polysorbate  60 mcg Subcutaneous Weekly - Thursday   ??? insulin lispro  0-6 Units Subcutaneous TID WC   ??? insulin lispro  0-3 Units Subcutaneous 2 times per day   ??? 0.9 % sodium chloride  250 mL Intravenous Once   ??? sodium chloride flush  10 mL Intravenous 2 times per day   ??? carvedilol  3.125 mg Oral BID   ??? docusate sodium  100 mg Oral BID   ??? fluticasone  1 spray Nasal Daily   ??? furosemide  40 mg Oral BID   ??? hydrALAZINE  100 mg Oral TID   ??? isosorbide dinitrate  30 mg Oral TID   ??? NIFEdipine  30 mg Oral BID   ??? b complex-C-E-zinc  1 tablet Oral Daily     Continuous Infusions:  ??? dextrose       PRN Meds:acetaminophen, oxyCODONE, sodium chloride flush, ondansetron, glucose, dextrose, glucagon (rDNA), dextrose  I/O last 3 completed shifts:  In: 3140 [P.O.:240; Blood:700]  Out: 1200   I/O this shift:  In: 130 [P.O.:30; I.V.:100]  Out: -     Intake/Output Summary (Last 24 hours) at 02/28/2018 1136  Last data filed at 02/28/2018 1117  Gross per 24 hour   Intake 3270 ml   Output 1200 ml   Net 2070 ml     CBC:   Recent Labs     02/25/18  1853 02/27/18  0611 02/27/18  2305   WBC 7.8 6.8  --    HGB 7.7* 6.2  HGB CALLED TO MELINDA BOVEY RN ON 02/27/18 AT 0635 BY B BAYLESS CLS  HGB CALLED TO MELINDA BOVEY RN ON 02/27/18 AT 0635 BY B BAYLESS CLS  RESULTS READ BACK  * 10.6*   PLT 141 141  --      BMP:    Recent Labs     02/26/18  0656 02/27/18  0611 02/28/18  0127   NA 142 137 137   K 3.7 4.0 4.1   CL 105 102 104   CO2 24 21 23    BUN 63* 73* 23   CREATININE 2.4* 2.8* 1.1   GLUCOSE  119* 93 151*     Hepatic:   No results for input(s): AST, ALT, ALB, BILITOT, ALKPHOS in the last 72 hours.  Troponin: No results for input(s): TROPONINI in the last 72 hours.  BNP: No results for input(s): BNP in the last 72 hours.  Lipids: No results for input(s): CHOL, HDL in the last 72 hours.    Invalid input(s): LDLCALCU  ABGs: No results found for: PHART, PO2ART, PCO2ART  INR:   No results for input(s): INR in the last 72 hours.    Objective:   Vitals: BP (!) 145/63    Pulse 65    Temp 98.2 ??F (36.8 ??C) (Oral)    Resp 16    Wt 70 lb 8.8 oz (32 kg)    SpO2 97%  BMI 15.82 kg/m??   General appearance: alert and cooperative with exam cachectic  HEENT: Head: Normal, normocephalic, atraumatic.  Neck: no adenopathy, no carotid bruit, no JVD, supple, symmetrical, trachea midline and thyroid not enlarged, symmetric, no tenderness/mass/nodules  Lungs: clear to auscultation bilaterally  Heart: regular rate and rhythm, S1, S2 normal, no murmur, click, rub or gallop  Abdomen: soft, non-tender; bowel sounds normal; no masses,  no organomegaly  Extremities: extremities normal, atraumatic, no cyanosis or edema  Neurologic: Mental status: alertness: alert    Assessment and Plan:     Patient Active Problem List:     Type II or unspecified type diabetes mellitus without mention of complication, not stated as uncontrolled     Shortness of breath on exertion     Anemia of chronic disease     Kidney problem     CAD (coronary artery disease)     Arthritis     Chest pain     Essential hypertension     PAF (paroxysmal atrial fibrillation) (HCC)     Stage 4 chronic kidney disease (HCC)     ARF (acute renal failure) (HCC)     AKI (acute kidney injury) (HCC)     Traumatic seroma of left lower leg     Acute renal failure superimposed on stage 4 chronic kidney disease (HCC)     Uncontrolled type 2 diabetes mellitus (HCC)     Acute on chronic blood loss anemia (HCC)  IMP  AKI on CKD4 improved after dialysis- dont believe the creat of 1.1-  have been seeing her for 5 plus years- never had a creat below 2  Uremia better but not resolved  Acidosis better  Anemia - post transfusion- adequate iron stores-       Plan    Recheck labs in am  GI work up in progress  Follow [place]ley- she may drop again- Hb kept dropping despite 2 units and now  Units of PRC- not ready for discharge yet      Claretta Fraise, MD

## 2018-02-28 NOTE — Progress Notes (Signed)
Pre procedure checklist questions verified with pt including npo status, allergies and procedure. Last BB dose last pm, No blood thinners. Pt states no one here with her.

## 2018-02-28 NOTE — Plan of Care (Signed)
Problem: Infection:  Goal: Will remain free from infection  Description  Will remain free from infection  02/28/2018 1730 by Prentiss Bells, RN  Outcome: Ongoing  02/28/2018 1004 by Baldemar Lenis, RN  Outcome: Ongoing  02/28/2018 0950 by Baldemar Lenis, RN  Outcome: Ongoing     Problem: Safety:  Goal: Free from accidental physical injury  Description  Free from accidental physical injury  02/28/2018 1730 by Prentiss Bells, RN  Outcome: Ongoing  02/28/2018 1004 by Baldemar Lenis, RN  Outcome: Ongoing  02/28/2018 0950 by Baldemar Lenis, RN  Outcome: Ongoing  Goal: Free from intentional harm  Description  Free from intentional harm  02/28/2018 1730 by Prentiss Bells, RN  Outcome: Ongoing  02/28/2018 1004 by Baldemar Lenis, RN  Outcome: Ongoing  02/28/2018 0950 by Baldemar Lenis, RN  Outcome: Ongoing     Problem: Daily Care:  Goal: Daily care needs are met  Description  Daily care needs are met  02/28/2018 1730 by Prentiss Bells, RN  Outcome: Ongoing  02/28/2018 1004 by Baldemar Lenis, RN  Outcome: Ongoing  02/28/2018 0950 by Baldemar Lenis, RN  Outcome: Ongoing     Problem: Pain:  Goal: Patient's pain/discomfort is manageable  Description  Patient's pain/discomfort is manageable  02/28/2018 1730 by Prentiss Bells, RN  Outcome: Ongoing  02/28/2018 1004 by Baldemar Lenis, RN  Outcome: Ongoing  02/28/2018 0950 by Baldemar Lenis, RN  Outcome: Ongoing     Problem: Discharge Planning:  Goal: Patients continuum of care needs are met  Description  Patients continuum of care needs are met  02/28/2018 1730 by Prentiss Bells, RN  Outcome: Ongoing  02/28/2018 1004 by Baldemar Lenis, RN  Outcome: Ongoing  02/28/2018 0950 by Baldemar Lenis, RN  Outcome: Ongoing     Problem: Falls - Risk of:  Goal: Will remain free from falls  Description  Will remain free from falls  02/28/2018 1730 by Prentiss Bells, RN  Outcome: Ongoing  02/28/2018 1004 by Baldemar Lenis, RN  Outcome: Ongoing  02/28/2018 0950 by Baldemar Lenis, RN  Outcome: Ongoing  Goal: Absence of physical injury  Description  Absence of  physical injury  02/28/2018 1730 by Prentiss Bells, RN  Outcome: Ongoing  02/28/2018 1004 by Baldemar Lenis, RN  Outcome: Ongoing  02/28/2018 0950 by Baldemar Lenis, RN  Outcome: Ongoing     Problem: Risk for Impaired Skin Integrity  Goal: Tissue integrity - skin and mucous membranes  Description  Structural intactness and normal physiological function of skin and  mucous membranes.  02/28/2018 1730 by Prentiss Bells, RN  Outcome: Ongoing  02/28/2018 1004 by Baldemar Lenis, RN  Outcome: Ongoing  02/28/2018 0950 by Baldemar Lenis, RN  Outcome: Ongoing

## 2018-02-28 NOTE — Progress Notes (Signed)
Skin assessment done by this nurse and an aid. Bruising noticed scattered. Also pt has redness on coccyx. Pt cleaned up mepilex border applied with barrier cream.

## 2018-02-28 NOTE — Progress Notes (Signed)
Hospitalist Progress Note      Name:  Judy Velasquez DOB/Age/Sex: [birth year]  (84 y.o. female)   MRN & CSN:  [id number] & [id number] Admission Date/Time: 02/24/2018  9:52 AM   Location:  1129/1129-A PCP: [name], MD         Hospital Day: 5    Assessment and Plan:   Judy Velasquez is a 84 y.o.  female  who presents with Acute renal failure superimposed on stage 4 chronic kidney disease ([place])        AKI on CKD stage 4  Tunneled cath placed --> HD  Daily weights  I/O  Nephrology on board  ??  Acute on chronic anemia secondary to blood loss  Likely UGIB  Hgb 6.8  --> 10.6 s/p 2U during HD  H/H Q8h  GI consult--> EGD negative. Dr Edwena Felty to decide on C Scopy  Dc heparin  ??  DM   Cont sliding scale insulin  CB monitoring  ??  CAD  PAF  hyperlipidemia  hypertension  Cont home meds  ??    Chronic treatment continued per home medications unless  contraindicated by above plan and assessment.    The above assessment/plan has been explained to the patient, who indicated understanding.      Diet DIET CLEAR LIQUID; No Added Salt (3-4 GM); Daily Fluid Restriction: 1500 ml   DVT Prophylaxis []  Lovenox, []   Heparin, [x]  SCDs, [] No VTE prophylaxis, patient ambulating   GI Prophylaxis []  PPI, [x]  H2 Blocker, []  No GI prophylaxis, patient is receiving diet/Tube Feeds   Code Status Full Code             History of Present Illness:     Chief Complaint: Acute renal failure superimposed on stage 4 chronic kidney disease (Judy Velasquez)    Patient was seen and examined at bedside today. Patient sitting up comfortably in bed in NAD.   Ten point ROS reviewed negative, unless as noted above    Objective:       Intake/Output Summary (Last 24 hours) at 02/28/2018 1425  Last data filed at 02/28/2018 1117  Gross per 24 hour   Intake 3270 ml   Output 1200 ml   Net 2070 ml      Vitals:   Vitals:    02/28/18 1117   BP: (!) 145/63   Pulse: 65   Resp: 16   Temp: 98.2 ??F (36.8 ??C)   SpO2: 97%     Physical Exam:      GEN Awake female, sitting upright in bed in no  apparent distress. Appears given age.  EYES Pupils are equally round.  No scleral erythema, discharge, or conjunctivitis.  HENT Mucous membranes are moist.   RESP Clear to auscultation, no wheezes, rales or rhonchi.    CARDIO/VASC S1/S2 auscultated. No murmurs  GI Abdomen is soft without significant tenderness, masses, or guarding.   MSK No gross joint deformities. Spontaneous movement of all extremities  SKIN Normal coloration, warm, dry.  NEURO Grossly normal  PSYCH Awake, alert, oriented     Medications:   Medications:   ??? sodium chloride  250 mL Intravenous Once   ??? darbepoetin alfa-polysorbate  60 mcg Subcutaneous Weekly - Thursday   ??? insulin lispro  0-6 Units Subcutaneous TID WC   ??? insulin lispro  0-3 Units Subcutaneous 2 times per day   ??? 0.9 % sodium chloride  250 mL Intravenous Once   ??? sodium chloride flush  10 mL Intravenous 2 times  per day   ??? carvedilol  3.125 mg Oral BID   ??? docusate sodium  100 mg Oral BID   ??? fluticasone  1 spray Nasal Daily   ??? furosemide  40 mg Oral BID   ??? hydrALAZINE  100 mg Oral TID   ??? isosorbide dinitrate  30 mg Oral TID   ??? NIFEdipine  30 mg Oral BID   ??? b complex-C-E-zinc  1 tablet Oral Daily      Infusions:   ??? dextrose       PRN Meds:   acetaminophen 650 mg Q4H PRN   oxyCODONE 2.5 mg Q4H PRN   sodium chloride flush 10 mL PRN   ondansetron 4 mg Q6H PRN   glucose 15 g PRN   dextrose 12.5 g PRN   glucagon (rDNA) 1 mg PRN   dextrose 100 mL/hr PRN         Electronically signed by Bailey Mech, MD on 02/28/2018 at 2:25 PM

## 2018-02-28 NOTE — Op Note (Signed)
BRIEF EGD REPORT:    Pre-op Diagnosis: anemia and +stool guaiac test  Post-op Diagnosis: see impression below    Anesthesia: MAC  Estimated blood loss: less than 50 cc  Implants: none  Drains: none  Complications: none  Specimens- none- or as referred to below    Impression:    1) normal esophagus, stomach and duodenum.  There was no ulcer, erosion or erythema    2) There was some discoloration (dark) at the GE junction and 2nd potion of duodenum, which was likely due to iron supplements.          Suggest:   1) continue current medicines and continue following H/H eery 12 hours   2) consider colonoscopy    3) Dr. Edwena Felty will return on Monday to decide whether and when she will have a colonoscopy               4) clear liquid diet     The complete operative report (including photos) is available in the following locations:   1) soft chart now   2) report will also be scanned and can then be found by going to "chart review" then "notes" then "op report" - "scanning HPF" or by going to "chart review" then "media" then "op report". For review of photos, may need to go to page 2.

## 2018-02-28 NOTE — Progress Notes (Signed)
Lab calls, notified of positive occult blood stool.

## 2018-02-28 NOTE — Anesthesia Pre-Procedure Evaluation (Signed)
Department of Anesthesiology  Preprocedure Note       Name:  Judy Velasquez   Age:  84 y.o.  DOB:  December 03, 1928                                          MRN:  [id number]         Date:  02/28/2018      Surgeon: Moishe Spice):  Marvis Repress, MD    Procedure: EGD ESOPHAGOGASTRODUODENOSCOPY (N/A )    Medications prior to admission:   Prior to Admission medications    Medication Sig Start Date End Date Taking? Authorizing Provider   HYDROcodone-acetaminophen (NORCO) 5-325 MG per tablet Take 1 tablet by mouth every 6 hours as needed for Pain.    Historical Provider, MD   furosemide (LASIX) 40 MG tablet Take 40 mg by mouth 2 times daily    Historical Provider, MD   isosorbide dinitrate (ISORDIL) 30 MG tablet Take 1 tablet by mouth 3 times daily 09/26/17   Alva Garnet, MD   hydrALAZINE (APRESOLINE) 100 MG tablet Take 1 tablet by mouth 3 times daily 09/26/17   Alva Garnet, MD   carvedilol (COREG) 3.125 MG tablet Take 1 tablet by mouth 2 times daily 09/26/17   Alva Garnet, MD   NIFEdipine (ADALAT CC) 30 MG extended release tablet Take 1 tablet by mouth 2 times daily 09/26/17   Alva Garnet, MD   docusate sodium (PHILLIPS STOOL SOFTENER) 100 MG capsule Take 1 capsule by mouth 2 times daily 09/26/17   Alva Garnet, MD   B Complex-Biotin-FA (SUPER B-50 B COMPLEX PO) Take by mouth    Historical Provider, MD   fluticasone (FLONASE) 50 MCG/ACT nasal spray 1 spray by Nasal route daily    Historical Provider, MD       Current medications:    Current Facility-Administered Medications   Medication Dose Route Frequency Provider Last Rate Last Dose   . 0.9 % sodium chloride bolus  250 mL Intravenous Once Abel Presto, MD       . acetaminophen (TYLENOL) tablet 650 mg  650 mg Oral Q4H PRN Cheryll Cockayne, MD   650 mg at 02/28/18 0104   . oxyCODONE (ROXICODONE) immediate release tablet 2.5 mg  2.5 mg Oral Q4H PRN Cheryll Cockayne, MD   2.5 mg at 02/28/18 0547   . darbepoetin alfa-polysorbate (ARANESP) injection 60 mcg  60 mcg Subcutaneous Weekly - Thursday Javier Docker,  MD   60 mcg at 02/26/18 1507   . insulin lispro (HUMALOG) injection vial 0-6 Units  0-6 Units Subcutaneous TID WC Quinn Plowman, MD   1 Units at 02/26/18 1257   . insulin lispro (HUMALOG) injection vial 0-3 Units  0-3 Units Subcutaneous 2 times per day Quinn Plowman, MD   1 Units at 02/26/18 0001   . 0.9 % sodium chloride infusion 250 mL  250 mL Intravenous Once Javier Docker, MD       . sodium chloride flush 0.9 % injection 10 mL  10 mL Intravenous 2 times per day [name], APRN - CNP   10 mL at 02/27/18 2350   . sodium chloride flush 0.9 % injection 10 mL  10 mL Intravenous PRN [name], APRN - CNP       . ondansetron (ZOFRAN) injection 4 mg  4 mg Intravenous Q6H PRN [name], APRN - CNP       .  carvedilol (COREG) tablet 3.125 mg  3.125 mg Oral BID [name], APRN - CNP   3.125 mg at 02/27/18 2348   . docusate sodium (COLACE) capsule 100 mg  100 mg Oral BID [name], APRN - CNP   100 mg at 02/27/18 2348   . fluticasone (FLONASE) 50 MCG/ACT nasal spray 1 spray  1 spray Nasal Daily [name], APRN - CNP   1 spray at 02/27/18 1104   . furosemide (LASIX) tablet 40 mg  40 mg Oral BID [name], APRN - CNP   Stopped at 02/27/18 1100   . hydrALAZINE (APRESOLINE) tablet 100 mg  100 mg Oral TID [name], APRN - CNP   100 mg at 02/28/18 0546   . isosorbide dinitrate (ISORDIL) tablet 30 mg  30 mg Oral TID [name], APRN - CNP   30 mg at 02/27/18 2349   . NIFEdipine (PROCARDIA XL) extended release tablet 30 mg  30 mg Oral BID [name], APRN - CNP   30 mg at 02/27/18 2349   . b complex-C-E-zinc (STRESS FORMULA W/ ZINC) 1 tablet  1 tablet Oral Daily [name], APRN - CNP   1 tablet at 02/27/18 1104   . glucose (GLUTOSE) 40 % oral gel 15 g  15 g Oral PRN [name], APRN - CNP       . dextrose 50 % IV solution  12.5 g Intravenous PRN [name], APRN - CNP       . glucagon (rDNA) injection 1 mg  1 mg Intramuscular PRN [name], APRN - CNP       .  dextrose 5 % solution  100 mL/hr Intravenous PRN [name], APRN - CNP           Allergies:    Allergies   Allergen Reactions   . Celebrex [Celecoxib] Shortness Of Breath, Swelling and Anaphylaxis     "Swelling Of The Esophagus"   . Fish Allergy Anaphylaxis   . Lisinopril Other (See Comments)     Dry Mouth   . Neurontin [Gabapentin] Other (See Comments)     Dizziness   . Norvasc [Amlodipine Besylate] Other (See Comments)     Edema Of Legs   . Other      "Allergic To Certain Seafoods Causing Headaches, Difficulty Breathing"   . Shellfish Allergy Anaphylaxis   . Ciprofloxacin Rash   . Lipitor Other (See Comments)     Ache   . Losartan      Per nephrologist       Problem List:    Patient Active Problem List   Diagnosis Code   . Type II or unspecified type diabetes mellitus without mention of complication, not stated as uncontrolled E11.9   . Shortness of breath on exertion R06.02   . Anemia of chronic disease D63.8   . Kidney problem N28.9   . CAD (coronary artery disease) I25.10   . Arthritis M19.90   . Chest pain R07.9   . Essential hypertension I10   . PAF (paroxysmal atrial fibrillation) (HCC) I48.0   . Stage 4 chronic kidney disease (HCC) N18.4   . ARF (acute renal failure) (HCC) N17.9   . AKI (acute kidney injury) (HCC) N17.9   . Traumatic seroma of left lower leg S80.12XA   . Acute renal failure superimposed on stage 4 chronic kidney disease (HCC) N17.9, N18.4   . Uncontrolled type 2 diabetes mellitus (HCC) E11.65   . Acute on chronic blood loss anemia (HCC)  D60.0   . Anemia due to chronic kidney disease N18.9, D63.1       Past Medical History:        Diagnosis Date   . Allergic rhinitis    . Anemia    . Anesthesia     Nausea/Vomiting Post Op In Past   . Anxiety    . Arthritis     s/p bilateral hip and right knee replacement sees Dr. Salomon Mast   . Back problem     "Occ Back Hurts"   . Blood transfusion    . CAD (coronary artery disease) 2008    Sees Dr. Marcelino Freestone CABG x 3   . DCIS (ductal carcinoma in situ)  05/20/2011   . Glaucoma     Bilateral Eyes   . H/O cardiovascular stress test 05/20/11   . Heart attack (HCC) 2006   . HOH (hard of hearing)     Bilateral Ears   . Hyperlipidemia    . Hypertension    . IBS (irritable bowel syndrome)    . Kidney problem     "Sees  Dr. Wallis Bamberg For Blood Pressure And Kidneys"   . Nausea & vomiting     Nausea/Vomiting Post Op In Past   . Osteoarthritis    . Osteoporosis    . Right Breast Cancer Dx 7-12   . Shortness of breath on exertion    . Staph infection 1980's    "They sent me to a tropical infection doctor"   . TB (tuberculosis)     "Probably had it as a child, my dad had it"   . Type II or unspecified type diabetes mellitus without mention of complication, not stated as uncontrolled Dx 1980's       Past Surgical History:        Procedure Laterality Date   . BREAST LUMPECTOMY  05/20/2011    Right   . BREAST SURGERY  7-12    Right Breast Biopsy   . CARDIAC SURGERY  2008    CABG (3 Bypasses)   . CARPAL TUNNEL RELEASE  2000's    Right   . CESAREAN SECTION  "Late 1950's or Early 1960's"    X 3   . CHOLECYSTECTOMY, LAPAROSCOPIC  1990's   . COLONOSCOPY  2000's   . CORONARY ARTERY BYPASS GRAFT  2008    cabg x 3   . ENDOSCOPY, COLON, DIAGNOSTIC  1990's   . HYSTERECTOMY, TOTAL ABDOMINAL  1960's   . JOINT REPLACEMENT  1980's    Total Right Hip   . JOINT REPLACEMENT  2000's    Total Right Hip   . JOINT REPLACEMENT  1990's    Total Left Hip   . JOINT REPLACEMENT  1980's    Total Right Knee   . OTHER SURGICAL HISTORY      Family Physician Is Dr. Berneice Heinrich In [place], [place]   . OTHER SURGICAL HISTORY  2000's    "Took section of my intestines out, it was a exploratory operation, they found scar tissue from the c-sections"   . TONSILLECTOMY AND ADENOIDECTOMY  1939   . UPPER GASTROINTESTINAL ENDOSCOPY N/A 09/18/2017    EGD BIOPSY performed by Roylene Reason, MD at [facility] ENDOSCOPY       Social History:    Social History     Tobacco Use   . Smoking status: Never Smoker   . Smokeless tobacco: Never  Used   Substance Use Topics   . Alcohol use: No  Counseling given: Not Answered      Vital Signs (Current):   Vitals:    02/27/18 2315 02/27/18 2333 02/28/18 0104 02/28/18 0440   BP: (!) 206/81 (!) 212/124 (!) 187/81 (!) 165/72   Pulse: 63 77  61   Resp: 13 14  16    Temp: 37.1 C (98.7 F) 36.8 C (98.3 F)  37.3 C (99.1 F)   TempSrc:  Oral  Oral   SpO2:  100%  99%   Weight:    70 lb 8.8 oz (32 kg)                                              BP Readings from Last 3 Encounters:   02/28/18 (!) 165/72   01/13/18 (!) 155/55   12/02/17 (!) 153/71       NPO Status:                                                                                 BMI:   Wt Readings from Last 3 Encounters:   02/28/18 70 lb 8.8 oz (32 kg)   01/13/18 95 lb (43.1 kg)   12/02/17 107 lb (48.5 kg)     Body mass index is 15.82 kg/m.    CBC:   Lab Results   Component Value Date    WBC 6.8 02/27/2018    RBC 2.04 02/27/2018    HGB 10.6 02/27/2018    HCT 31.8 02/27/2018    MCV 97.1 02/27/2018    RDW 19.1 02/27/2018    PLT 141 02/27/2018       CMP:   Lab Results   Component Value Date    NA 137 02/28/2018    K 4.1 02/28/2018    CL 104 02/28/2018    CO2 23 02/28/2018    BUN 23 02/28/2018    CREATININE 1.1 02/28/2018    GFRAA 57 02/28/2018    LABGLOM 47 02/28/2018    GLUCOSE 151 02/28/2018    PROT 5.0 02/25/2018    PROT 6.5 04/20/2010    CALCIUM 8.6 02/28/2018    BILITOT 0.9 02/25/2018    ALKPHOS 48 02/25/2018    AST 13 02/25/2018    ALT <5 02/25/2018       POC Tests:   Recent Labs     02/28/18  0244   POCGLU 154*       Coags:   Lab Results   Component Value Date    PROTIME 10.7 02/24/2018    PROTIME 11.8 10/23/2009    INR 0.94 02/24/2018    APTT 43.4 02/24/2018       HCG (If Applicable): No results found for: PREGTESTUR, PREGSERUM, HCG, HCGQUANT     ABGs: No results found for: PHART, PO2ART, PCO2ART, HCO3ART, BEART, O2SATART     Type & Screen (If Applicable):  No results found for: LABABO, LABRH    Anesthesia  Evaluation     history of anesthetic complications: PONV.  Airway: Mallampati: II  TM distance: >3 FB   Neck ROM: full  Mouth opening: > =  3 FB Dental:      Comment: Poor dentintion    Pulmonary:normal exam    (+) shortness of breath:                             Cardiovascular:  Exercise tolerance: poor (<4 METS),   (+) hypertension:, past MI:, CAD:, dysrhythmias: atrial fibrillation, DOE:,                   Neuro/Psych:   Negative Neuro/Psych ROS              GI/Hepatic/Renal:   (+) renal disease: dialysis,           Endo/Other:    (+) DiabetesType II DM, , blood dyscrasia: anemia:., .                 Abdominal:           Vascular: negative vascular ROS.                                       Anesthesia Plan      MAC and TIVA     ASA 3       Induction: intravenous.      Anesthetic plan and risks discussed with patient.      Plan discussed with CRNA and attending.                  Traci Sermon, APRN - CRNA   02/28/2018
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
## 2018-02-28 NOTE — Progress Notes (Signed)
Pt tolerated 3 hours of HD well, removing 1L of fluid. Patient received 2 units of blood during dialysis.     Patient Name: Judy Velasquez  Patient DOB: 04-17-1929  MRN: [id number]     Acct: [id number]  Date of Admission: 02/24/2018  Room/Bed: 1129/1129-A  Code Status:  Full Code  Allergies:   Allergies   Allergen Reactions   ??? Celebrex [Celecoxib] Shortness Of Breath, Swelling and Anaphylaxis     "Swelling Of The Esophagus"   ??? Fish Allergy Anaphylaxis   ??? Lisinopril Other (See Comments)     Dry Mouth   ??? Neurontin [Gabapentin] Other (See Comments)     Dizziness   ??? Norvasc [Amlodipine Besylate] Other (See Comments)     Edema Of Legs   ??? Other      "Allergic To Certain Seafoods Causing Headaches, Difficulty Breathing"   ??? Shellfish Allergy Anaphylaxis   ??? Ciprofloxacin Rash   ??? Lipitor Other (See Comments)     Ache   ??? Losartan      Per nephrologist     Diagnosis:    Patient Active Problem List   Diagnosis   ??? Type II or unspecified type diabetes mellitus without mention of complication, not stated as uncontrolled   ??? Shortness of breath on exertion   ??? Anemia of chronic disease   ??? Kidney problem   ??? CAD (coronary artery disease)   ??? Arthritis   ??? Chest pain   ??? Essential hypertension   ??? PAF (paroxysmal atrial fibrillation) ([place])   ??? Stage 4 chronic kidney disease ([place])   ??? ARF (acute renal failure) ([place])   ??? AKI (acute kidney injury) ([place])   ??? Traumatic seroma of left lower leg   ??? Acute renal failure superimposed on stage 4 chronic kidney disease ([place])   ??? Uncontrolled type 2 diabetes mellitus ([place])   ??? Acute on chronic blood loss anemia (HCC)   ??? Anemia due to chronic kidney disease         Treatment:  Hemodilaysis 2:1  Routine  Location: Acute Room    Diabetic: No  NPO: No  Isolation Precautions: Dialysis     Consent for Treatment Verified: Yes  Blood Consent Verified: Yes     Safety Verified: Identify (I), Consent (C), Equipment (E), HepB Status (B), Orders Complete (O), Access Verified (A) and Timeliness  (T)  Time out performed prior to access at 1912.     Report Received from Primary RN: Yes Time: 1914  Primary RN (First Initial, Last Name, Title): K.Flax,RN  Incapacitated Nurse Education Completed: Not Applicable     HBsAg ONLY:  Date Drawn: February 24, 2018       Results: Negative  HBsAb:  Date Drawn:  February 24, 2018       Results: Susceptible <10    Order     Citrate Bath  Ca+ (Calcium): (3.5) (02/27/18 2315)  K+ (Potassium): 4 (02/27/18 2315)  Bicarb: 30 (02/27/18 2315)  Na+ Modeling: Not Applicable  Dialyzer: N829  Dialysate Temperature (C):  36  Blood Flow Rate (BFR):  250   Dialysate Flow Rate (DFR):   500        Treatment  Treatment Number: 2  Time On: 1949  Time Off: 5621  Treatment Goal: 1kg  Weight: 67 lb 3.8 oz (30.5 kg) (02/27/18 0456)     Access to be Utilized   Access: Tunneled Catheter  Location: Subclavian  Side: Right   Needle gauge:  Not Applicable  +  Bruit/Thrill: Not Applicable    First Use X-ray Verified: Yes  OK to use line order: Yes    Site Assessment:  Signs and Symptoms of Infection/Inflammation: None  If yes: Not Applicable  Dressing: Dry and Intact  Site Prep: Medical Aseptic Technique  Dressing Changed this Treatment: No  If yes, by whom: new cath placed in IR today  Date of Last Dressing Change: February 27, 2018  Antimicrobial Patch in place?: Yes  Red Alcohol Caps in place?: Yes  Gauze Dressing?: No  Non Dialysis Use?: No  Comment:    Flows: Good, Patent  If access problem, who was notified:     Pre and Post-Assessment  Patient Vitals for the past 8 hrs:   Level of Consciousness Oriented X Heart Rhythm Resp Respiratory Quality/Effort O2 Device Bilateral Breath Sounds Skin Color Skin Condition/Temp Abdomen Inspection Bowel Sounds (All Quadrants) Edema Comments Pain Level   02/27/18 1900 0 -- -- 16 -- None (Room air) -- -- -- -- -- -- -- --   02/27/18 1930 0 4 Regular 20 Unlabored None (Room air) Diminished Pale Dry;Warm Soft Active None pretreatment vitals and assessment 0   02/27/18 2018 -- --  -- 24 -- -- -- -- -- -- -- -- -- --   02/27/18 2030 -- -- -- 18 -- -- -- -- -- -- -- -- -- --   02/27/18 2045 -- -- -- 16 -- -- -- -- -- -- -- -- -- --   02/27/18 2116 -- -- -- 15 -- -- -- -- -- -- -- -- -- --   02/27/18 2315 0 4 Regular 13 Unlabored None (Room air) Clear Pale Dry;Warm Soft Active None post treatment vitals and assessment 0   02/27/18 2333 0 -- -- 14 -- None (Room air) -- -- -- -- -- -- -- 0     Labs  Recent Labs     02/25/18  0502 02/25/18  1853 02/27/18  0611 02/27/18  2305   WBC 8.5 7.8 6.8  --    HGB 7.7* 7.7* 6.2  HGB CALLED TO MELINDA BOVEY RN ON 02/27/18 AT 0635 BY B BAYLESS CLS  HGB CALLED TO MELINDA BOVEY RN ON 02/27/18 AT 0635 BY B BAYLESS CLS  RESULTS READ BACK  * 10.6*   HCT 24.3* 24.2* 19.8* 31.8*   PLT 143 141 141  --                                                                   Recent Labs     02/25/18  0502 02/26/18  0656 02/27/18  0611   NA 137 142 137   K 3.9 3.7 4.0   CL 103 105 102   CO2 23 24 21    BUN 49* 63* 73*   CREATININE 1.8* 2.4* 2.8*   GLUCOSE 119* 119* 93     IV Drips and Rate/Dose  ??? dextrose        Safety - Before each treatment:   Dialysis Machine No.: [id number]  RO Machine No.: [id number]  Dialyzer Lot No.: [id number]  RO Machine Log Sheet Completed: Yes  Machine Alarm Self Test: Completed;Passed (02/27/18 1949)  Machine Autotest: Completed, Games developer: Tested, Proper Function, pH Reading  Extracorporeal Circuit  Tested for Integrity: Yes  Machine Conductivity: 14.2  Manual Conductivity: 14  Machine Ph: 7.5  Bicarbonate Concentrate Lot No.: [id number]  Acid Concentrate Lot No.: [id number]  Manual Ph: 7.8  Bleach Test (Neg): Yes  Bath Temperature: 96.1 ??F (35.6 ??C)  Tubing Lot#: [id number]  Conductivity Meter Serial #: [id number]  All Connections Secure?: Yes  Venous Parameters Set?: Yes  Arterial Parameters Set?: Yes  Saline Line Double Clamped?: Yes  Air Foam Detector Engaged?: Yes  Machine Functioning Alarm Free? Yes  Prime Given: 243ml    Chlorine Testing - Before each  treatment and every 4 hours:   1st check: less than 0.1 ppm at (time): 1817  2nd check: less than 0.1 ppm at (time): 2030  3rd check: Not Applicable  (if greater than 0.1 ppm, then check every 30 minutes from secondary)     Vital Signs  Patient Vitals for the past 6 hrs:   BP Pulse Blood Flow Rate (ml/min) Ultrafiltration Rate (ml/hr) Ultrafiltration Total Temp Arterial Pressure (mmHg) Venous Pressure (mmHg) TMP Hemodialysis Conductivity DFR Comments Access Visible   02/27/18 1900 133/63 80 -- -- -- 97.2 ??F (36.2 ??C) -- -- -- -- -- -- --   02/27/18 1930 (!) 163/31 67 -- -- -- 97.8 ??F (36.6 ??C) -- -- -- -- -- -- --   02/27/18 1949 (!) 155/50 75 200 ml/min 500 ml/hr 0 ml -- -40 mmHg 40 60 14.4 500 treatment initiated Yes   02/27/18 2000 -- -- 250 ml/min 500 ml/hr 108 ml -- -60 mmHg 70 60 -- 500 lines intact Yes   02/27/18 2015 (!) 158/62 69 250 ml/min 500 ml/hr 210 ml -- -60 mmHg 70 60 -- 500 1 unit of blood started Yes   02/27/18 2018 (!) 160/64 72 -- -- -- 98 ??F (36.7 ??C) -- -- -- -- -- -- --   02/27/18 2030 (!) 161/54 70 250 ml/min 500 ml/hr 345 ml -- -70 mmHg 70 70 -- 500 watching tv Yes   02/27/18 2045 (!) 178/57 71 250 ml/min 760 ml/hr 513 ml -- -70 mmHg 70 60 -- 500 no distress noted Yes   02/27/18 2100 (!) 198/53 61 250 ml/min 760 ml/hr 703 ml -- -60 mmHg 80 60 -- 500 VSS Yes   02/27/18 2115 (!) 182/68 62 250 ml/min 810 ml/hr 940 ml -- -60 mmHg 80 60 -- 500 2nd unit of blood started Yes   02/27/18 2116 (!) 183/71 62 -- -- -- 97.5 ??F (36.4 ??C) -- -- -- -- -- -- --   02/27/18 2130 (!) 212/65 65 250 ml/min 810 ml/hr 1150 ml -- -80 mmHg 90 60 -- 500 watching TV Yes   02/27/18 2145 (!) 193/75 68 250 ml/min 810 ml/hr 1345 ml -- -80 mmHg 90 60 -- 500 no complaints at this time Yes   02/27/18 2200 (!) 210/66 55 250 ml/min 810 ml/hr 1515 ml -- -80 mmHg 90 60 -- 500 2nd unit of blood complete Yes   02/27/18 2215 (!) 207/67 63 250 ml/min 810 ml/hr 1722 ml -- -90 mmHg 90 60 -- 500 resting with eyes closed Yes   02/27/18  2230 (!) 169/64 67 250 ml/min 810 ml/hr 1978 ml -- -90 mmHg 90 70 -- 500 watching TV Yes   02/27/18 2245 (!) 162/64 65 250 ml/min 810 ml/hr 2125 ml -- -90 mmHg 90 70 -- 500 VSS Yes   02/27/18 2300 (!) 182/80 63 250 ml/min 810 ml/hr 2200 ml -- -90 mmHg 80 70 -- 500  treatment complete Yes   02/27/18 2315 (!) 206/81 63 -- -- -- 98.7 ??F (37.1 ??C) -- -- -- -- -- -- --   02/27/18 2333 (!) 212/124 77 -- -- -- 98.3 ??F (36.8 ??C) -- -- -- -- -- -- --     Post-Dialysis  Arterial Catheter Locking Solution: Heparin (1000units:72ml) Volume (ml):1.7  Venous Catheter Locking Solution: Heparin (1000units:77ml)    Volume (ml): 1.8  Post-Treatment Procedures: Blood returned, Catheter capped, clamped and heparinized x 2 ports  Machine Disinfection Process: Acid/Vinegar Clean, Heat Disinfect, Exterior Machine Disinfection  Rinseback Volume (ml): 300 ml  Total Liters Processed (l/min): 44.6 l/min  Dialyzer Clearance: Moderately streaked  Duration of Treatment (minutes): 180 minutes  Heparin amount administered during treatment (units): 0 units  Hemodialysis Intake (ml): 2200 ml  Hemodialysis Output (ml): 1200 ml  NET Removed (ml): 1000 ml  Tolerated Treatment: Good  Patient Response to Treatment: no complaints  Physician Notified?: No       Provider Notification        Handoff complete and report given to Primary RN?: Yes Time:  2330   Primary RN (First Initial, Last Name, Title):  C Fairchild,RN     Education  Person Educated: Patient   Knowledge Base: Minimal  Barriers to Learning?: Yes, including hard of hearing  Preferred method of Learning: Oral  Topic(s): Access Care, Signs and Symptoms of Infection, Procedural, Medications, Potassium and Diet   Teaching Tools: Explanation   Response to Education: Verbalized Understanding and Requires Follow-up     Electronically signed by Oralia Manis, RN on 02/28/2018 at 12:03 AM

## 2018-02-28 NOTE — Anesthesia Post-Procedure Evaluation (Signed)
Department of Anesthesiology  Postprocedure Note    Patient: Mariah Gerstenberger  MRN: [id number]  Birthdate: 07-28-1929  Date of evaluation: 02/28/2018  Time:  9:47 AM     Procedure Summary     Date:  02/28/18 Room / Location:  [facility] / [facility]    Anesthesia Start:  0906 Anesthesia Stop:  8032    Procedure:  EGD DIAGNOSTIC ONLY (N/A ) Diagnosis:  (---)    Surgeon:  Barbette Merino, MD Responsible Provider:  Lucila Maine, DO    Anesthesia Type:  MAC, TIVA ASA Status:  3          Anesthesia Type: MAC, TIVA    Aldrete Phase I:      Aldrete Phase II:      Last vitals: Reviewed and per EMR flowsheets.       Anesthesia Post Evaluation    Patient location during evaluation: bedside  Patient participation: complete - patient participated  Level of consciousness: awake and alert  Pain score: 0  Airway patency: patent  Nausea & Vomiting: no vomiting and no nausea  Complications: no  Cardiovascular status: blood pressure returned to baseline and hemodynamically stable  Respiratory status: acceptable, room air, spontaneous ventilation and nonlabored ventilation  Hydration status: stable

## 2018-02-28 NOTE — Plan of Care (Signed)
Problem: Infection:  Goal: Will remain free from infection  Description  Will remain free from infection  02/28/2018 1004 by Baldemar Lenis, RN  Outcome: Ongoing  02/28/2018 0950 by Baldemar Lenis, RN  Outcome: Ongoing     Problem: Safety:  Goal: Free from accidental physical injury  Description  Free from accidental physical injury  02/28/2018 1004 by Baldemar Lenis, RN  Outcome: Ongoing  02/28/2018 0950 by Baldemar Lenis, RN  Outcome: Ongoing  Goal: Free from intentional harm  Description  Free from intentional harm  02/28/2018 1004 by Baldemar Lenis, RN  Outcome: Ongoing  02/28/2018 0950 by Baldemar Lenis, RN  Outcome: Ongoing     Problem: Daily Care:  Goal: Daily care needs are met  Description  Daily care needs are met  02/28/2018 1004 by Baldemar Lenis, RN  Outcome: Ongoing  02/28/2018 0950 by Baldemar Lenis, RN  Outcome: Ongoing     Problem: Pain:  Goal: Patient's pain/discomfort is manageable  Description  Patient's pain/discomfort is manageable  02/28/2018 1004 by Baldemar Lenis, RN  Outcome: Ongoing  02/28/2018 0950 by Baldemar Lenis, RN  Outcome: Ongoing     Problem: Discharge Planning:  Goal: Patients continuum of care needs are met  Description  Patients continuum of care needs are met  02/28/2018 1004 by Baldemar Lenis, RN  Outcome: Ongoing  02/28/2018 0950 by Baldemar Lenis, RN  Outcome: Ongoing     Problem: Falls - Risk of:  Goal: Will remain free from falls  Description  Will remain free from falls  02/28/2018 1004 by Baldemar Lenis, RN  Outcome: Ongoing  02/28/2018 0950 by Baldemar Lenis, RN  Outcome: Ongoing  Goal: Absence of physical injury  Description  Absence of physical injury  02/28/2018 1004 by Baldemar Lenis, RN  Outcome: Ongoing  02/28/2018 0950 by Baldemar Lenis, RN  Outcome: Ongoing     Problem: Risk for Impaired Skin Integrity  Goal: Tissue integrity - skin and mucous membranes  Description  Structural intactness and normal physiological function of skin and  mucous membranes.  02/28/2018 1004 by Baldemar Lenis, RN  Outcome:  Ongoing  02/28/2018 0950 by Baldemar Lenis, RN  Outcome: Ongoing

## 2018-02-28 NOTE — Progress Notes (Signed)
Report called to [facility] RN vitals 142/5459-19 99% on room air. Pt drowsy but arouses and talks to staff. Denies pain at this time. Transported back to room 1129 via cart with C Adkins transporter

## 2018-03-01 LAB — CRITICAL CARE PANEL
Anion Gap: 12 (ref 4–16)
BUN: 45 MG/DL — ABNORMAL HIGH (ref 6–23)
CO2: 22 MMOL/L (ref 21–32)
Calcium: 7.8 MG/DL — ABNORMAL LOW (ref 8.3–10.6)
Chloride: 101 mMol/L (ref 99–110)
Creatinine: 2.3 MG/DL — ABNORMAL HIGH (ref 0.6–1.1)
GFR African American: 24 mL/min/{1.73_m2} — ABNORMAL LOW (ref >60)
GFR Non-African American: 20 mL/min/{1.73_m2} — ABNORMAL LOW (ref >60)
Glucose: 89 MG/DL (ref 70–99)
Magnesium: 1.9 mg/dl (ref 1.8–2.4)
Phosphorus: 3.4 MG/DL (ref 2.5–4.9)
Potassium: 4.3 MMOL/L (ref 3.5–5.1)
Sodium: 135 MMOL/L (ref 135–145)

## 2018-03-01 LAB — POCT GLUCOSE
POC Glucose: 141 MG/DL — ABNORMAL HIGH (ref 70–99)
POC Glucose: 90 MG/DL (ref 70–99)
POC Glucose: 95 MG/DL (ref 70–99)
POC Glucose: 124 MG/DL — ABNORMAL HIGH (ref 70–99)
POC Glucose: 109 MG/DL — ABNORMAL HIGH (ref 70–99)

## 2018-03-01 LAB — OCCULT BLOOD X 3, STOOL
Occult Blood 2: POSITIVE — AB
Occult Blood, Stool #1: POSITIVE — AB

## 2018-03-01 LAB — HEMOGLOBIN AND HEMATOCRIT
Hematocrit: 28.9 % — ABNORMAL LOW (ref 37–47)
Hematocrit: 28.5 % — ABNORMAL LOW (ref 37–47)
Hematocrit: 27.3 % — ABNORMAL LOW (ref 37–47)
Hemoglobin: 9 GM/DL — ABNORMAL LOW (ref 12.5–16.0)
Hemoglobin: 8.9 GM/DL — ABNORMAL LOW (ref 12.5–16.0)
Hemoglobin: 8.3 GM/DL — ABNORMAL LOW (ref 12.5–16.0)

## 2018-03-01 MED ORDER — PEG 3350-KCL-NABCB-NACL-NASULF 236 G PO SOLR
236 g | Freq: Once | ORAL | Status: DC
Start: 2018-03-01 — End: 2018-03-04

## 2018-03-01 MED ORDER — FAMOTIDINE 20 MG/2ML IV SOLN
20 MG/2ML | Freq: Every day | INTRAVENOUS | Status: DC
Start: 2018-03-01 — End: 2018-03-05
  Administered 2018-03-02 – 2018-03-05 (×3): 20 mg via INTRAVENOUS

## 2018-03-01 MED FILL — CARVEDILOL 3.125 MG PO TABS: 3.125 mg | ORAL | Qty: 1

## 2018-03-01 MED FILL — HYDRALAZINE HCL 50 MG PO TABS: 50 mg | ORAL | Qty: 2

## 2018-03-01 MED FILL — ISOSORBIDE DINITRATE 20 MG PO TABS: 20 mg | ORAL | Qty: 2

## 2018-03-01 MED FILL — FUROSEMIDE 40 MG PO TABS: 40 mg | ORAL | Qty: 1

## 2018-03-01 MED FILL — STRESS B-COMPLEX/VIT C/ZINC PO TABS: ORAL | Qty: 1

## 2018-03-01 MED FILL — FAMOTIDINE 20 MG/2ML IV SOLN: 20 MG/2ML | INTRAVENOUS | Qty: 2

## 2018-03-01 MED FILL — FLUTICASONE PROPIONATE 50 MCG/ACT NA SUSP: 50 MCG/ACT | NASAL | Qty: 16

## 2018-03-01 MED FILL — NIFEDIPINE ER OSMOTIC RELEASE 30 MG PO TB24: 30 mg | ORAL | Qty: 1

## 2018-03-01 MED FILL — GOLYTELY 236 G PO SOLR: 236 g | ORAL | Qty: 4000

## 2018-03-01 MED FILL — DOK 100 MG PO CAPS: 100 mg | ORAL | Qty: 1

## 2018-03-01 MED FILL — OXYCODONE HCL 5 MG PO TABS: 5 mg | ORAL | Qty: 1

## 2018-03-01 NOTE — Consults (Signed)
RENAL DOSE ADJUSTMENT MADE PER P/T PROTOCOL    PREVIOUS ORDER:  Pepcid 20mg  ivp bid    CrCl cannot be calculated (Unknown ideal weight.).  Recent Labs     02/27/18  0611 02/28/18  0127 03/01/18  0625   BUN 73* 23 45*   CREATININE 2.8* 1.1 2.3*   PLT 141  --   --      NEW RENALLY ADJUSTED ORDER:  PEPCID 20MG  IVP DAILY    Judy Velasquez, [facility]  03/01/2018 10:44 AM  __________________________________________________

## 2018-03-01 NOTE — Progress Notes (Signed)
appreciate Dr Roosevelt Locks seeing her for me this weekend  She has occult GI bleeding with negative EGD  spoke to patient and she wants to proceed with colonoscopy tomorrow- have tentatively planned for 7:00 am tomorrow

## 2018-03-01 NOTE — Progress Notes (Signed)
Hospitalist Progress Note      Name:  Judy Velasquez DOB/Age/Sex: 09/16/1929  (84 y.o. female)   MRN & CSN:  [id number] & [id number] Admission Date/Time: 02/24/2018  9:52 AM   Location:  3001/3001-A PCP: [name], MD         Hospital Day: 6    Assessment and Plan:   Judy Velasquez is a 84 y.o.  female  who presents with Acute renal failure superimposed on stage 4 chronic kidney disease ([place])      AKI on CKD stage 4  Tunneled cath placed --> HD  Op dialysis next week  Daily weights  I/O  Nephrology on board  Per CM: Pt has been approved for dialysis at [facility] in [place].     Acute on chronic anemia secondary to blood loss  Likely UGIB  Hgb 6.8  --> 8.9 s/p 2U during HD  H/H Q8h  GI consult--> EGD negative. Dr Edwena Felty to decide on C Scopy  Dc heparin  ??  DM   Cont sliding scale insulin  CB monitoring  ??  CAD  PAF  hyperlipidemia  hypertension  Cont home meds  ??    Chronic treatment continued per home medications unless  contraindicated by above plan and assessment.    The above assessment/plan has been explained to the patient, who indicated understanding.      Diet DIET RENAL; Carb Control: 4 carb choices (60 gms)/meal; No Added Salt (3-4 GM); Daily Fluid Restriction: 1500 ml   DVT Prophylaxis []  Lovenox, []   Heparin, [x]  SCDs, [] No VTE prophylaxis, patient ambulating   GI Prophylaxis []  PPI, [x]  H2 Blocker, []  No GI prophylaxis, patient is receiving diet/Tube Feeds   Code Status Full Code             History of Present Illness:     Chief Complaint: Acute renal failure superimposed on stage 4 chronic kidney disease ([place])    Patient was seen and examined at bedside today. Patient sitting up comfortably in bed in NAD.   Ten point ROS reviewed negative, unless as noted above    Objective:       Intake/Output Summary (Last 24 hours) at 03/01/2018 1101  Last data filed at 03/01/2018 0428  Gross per 24 hour   Intake 31 ml   Output --   Net 31 ml      Vitals:   Vitals:    03/01/18 0942   BP:    Pulse: 55   Resp:    Temp:     SpO2:      Physical Exam:      GEN Awake female, sitting upright in bed in no apparent distress. Appears given age.  EYES Pupils are equally round.  No scleral erythema, discharge, or conjunctivitis.  HENT Mucous membranes are moist.   RESP Clear to auscultation, no wheezes, rales or rhonchi.    CARDIO/VASC S1/S2 auscultated. No murmurs  GI Abdomen is soft without significant tenderness, masses, or guarding.   MSK No gross joint deformities. Spontaneous movement of all extremities  SKIN Normal coloration, warm, dry.  NEURO Grossly normal  PSYCH Awake, alert, oriented     Medications:   Medications:   ??? [START ON 03/02/2018] famotidine (PEPCID) injection  20 mg Intravenous Daily   ??? sodium chloride  250 mL Intravenous Once   ??? darbepoetin alfa-polysorbate  60 mcg Subcutaneous Weekly - Thursday   ??? insulin lispro  0-6 Units Subcutaneous TID WC   ??? insulin lispro  0-3 Units Subcutaneous 2 times per day   ??? 0.9 % sodium chloride  250 mL Intravenous Once   ??? sodium chloride flush  10 mL Intravenous 2 times per day   ??? carvedilol  3.125 mg Oral BID   ??? docusate sodium  100 mg Oral BID   ??? fluticasone  1 spray Nasal Daily   ??? furosemide  40 mg Oral BID   ??? hydrALAZINE  100 mg Oral TID   ??? isosorbide dinitrate  30 mg Oral TID   ??? NIFEdipine  30 mg Oral BID   ??? b complex-C-E-zinc  1 tablet Oral Daily      Infusions:   ??? dextrose       PRN Meds:     acetaminophen 650 mg Q4H PRN   oxyCODONE 2.5 mg Q4H PRN   sodium chloride flush 10 mL PRN   ondansetron 4 mg Q6H PRN   glucose 15 g PRN   dextrose 12.5 g PRN   glucagon (rDNA) 1 mg PRN   dextrose 100 mL/hr PRN         Electronically signed by Bailey Mech, MD on 03/01/2018 at 11:01 AM

## 2018-03-01 NOTE — Progress Notes (Signed)
[facility] [address], [place] [address]  Phone: [phone]  Office Hours: 8:30AM - 4:30PM  Monday - Friday     Nephrology Progress Note  03/01/2018 10:07 AM  Subjective:   Admit Date: 02/24/2018  PCP: [name], MD  Interval History:  Nausea better  Had EGD negative  Appetite poor  For cscope  Diet: DIET RENAL; Carb Control: 4 carb choices (60 gms)/meal; No Added Salt (3-4 GM); Daily Fluid Restriction: 1500 ml      Data:   Scheduled Meds:  ??? famotidine (PEPCID) injection  20 mg Intravenous BID   ??? sodium chloride  250 mL Intravenous Once   ??? darbepoetin alfa-polysorbate  60 mcg Subcutaneous Weekly - Thursday   ??? insulin lispro  0-6 Units Subcutaneous TID WC   ??? insulin lispro  0-3 Units Subcutaneous 2 times per day   ??? 0.9 % sodium chloride  250 mL Intravenous Once   ??? sodium chloride flush  10 mL Intravenous 2 times per day   ??? carvedilol  3.125 mg Oral BID   ??? docusate sodium  100 mg Oral BID   ??? fluticasone  1 spray Nasal Daily   ??? furosemide  40 mg Oral BID   ??? hydrALAZINE  100 mg Oral TID   ??? isosorbide dinitrate  30 mg Oral TID   ??? NIFEdipine  30 mg Oral BID   ??? b complex-C-E-zinc  1 tablet Oral Daily     Continuous Infusions:  ??? dextrose       PRN Meds:acetaminophen, oxyCODONE, sodium chloride flush, ondansetron, glucose, dextrose, glucagon (rDNA), dextrose  I/O last 3 completed shifts:  In: 131 [P.O.:30; I.V.:100]  Out: -   No intake/output data recorded.    Intake/Output Summary (Last 24 hours) at 03/01/2018 1007  Last data filed at 03/01/2018 0428  Gross per 24 hour   Intake 31 ml   Output --   Net 31 ml     CBC:   Recent Labs     02/27/18  0611  02/28/18  1628 03/01/18  0030 03/01/18  0625   WBC 6.8  --   --   --   --    HGB 6.2  HGB CALLED TO MELINDA BOVEY RN ON 02/27/18 AT 0635 BY B BAYLESS CLS  HGB CALLED TO MELINDA BOVEY RN ON 02/27/18 AT 0635 BY B BAYLESS CLS  RESULTS READ BACK  *   < > 9.2* 9.0* 8.9*   PLT 141  --   --   --   --     < > = values in this interval not  displayed.     BMP:    Recent Labs     02/27/18  0611 02/28/18  0127 03/01/18  0625   NA 137 137 135   K 4.0 4.1 4.3   CL 102 104 101   CO2 21 23 22    BUN 73* 23 45*   CREATININE 2.8* 1.1 2.3*   GLUCOSE 93 151* 89     Hepatic:   No results for input(s): AST, ALT, ALB, BILITOT, ALKPHOS in the last 72 hours.  Troponin: No results for input(s): TROPONINI in the last 72 hours.  BNP: No results for input(s): BNP in the last 72 hours.  Lipids: No results for input(s): CHOL, HDL in the last 72 hours.    Invalid input(s): LDLCALCU  ABGs: No results found for: PHART, PO2ART, PCO2ART  INR:   No results for input(s):  INR in the last 72 hours.    Objective:   Vitals: BP (!) 125/43    Pulse 55    Temp 97.9 ??F (36.6 ??C) (Oral)    Resp 17    Ht 4\' 7"  (1.397 m)    Wt 85 lb 15.7 oz (39 kg)    SpO2 100%    BMI 19.98 kg/m??   General appearance: alert and cooperative with exam cachectic  HEENT: Head: Normal, normocephalic, atraumatic.  Neck: no adenopathy, no carotid bruit, no JVD, supple, symmetrical, trachea midline and thyroid not enlarged, symmetric, no tenderness/mass/nodules  Lungs: clear to auscultation bilaterally  Heart: regular rate and rhythm, S1, S2 normal, no murmur, click, rub or gallop  Abdomen: soft, non-tender; bowel sounds normal; no masses,  no organomegaly  Extremities: extremities normal, atraumatic, no cyanosis or edema  Neurologic: Mental status: alertness: alert    Assessment and Plan:     Patient Active Problem List:     Type II or unspecified type diabetes mellitus without mention of complication, not stated as uncontrolled     Shortness of breath on exertion     Anemia of chronic disease     Kidney problem     CAD (coronary artery disease)     Arthritis     Chest pain     Essential hypertension     PAF (paroxysmal atrial fibrillation) (HCC)     Stage 4 chronic kidney disease (HCC)     ARF (acute renal failure) (HCC)     AKI (acute kidney injury) (HCC)     Traumatic seroma of left lower leg     Acute renal  failure superimposed on stage 4 chronic kidney disease (HCC)     Uncontrolled type 2 diabetes mellitus (HCC)     Acute on chronic blood loss anemia (HCC)  IMP  AKI on CKD4 improved after dialysis-  Uremia better but not resolved  Acidosis better  Anemia - post transfusion- adequate iron stores- Hb 9      Plan    Recheck labs in am  GI work up in progress  Op dialysis next week    Claretta Fraise, MD

## 2018-03-01 NOTE — Progress Notes (Signed)
This nurse had explained to pt that she would need to drink 3 liters of solution in a 3 hour period in order to do procedure in the morning. Pt said that she would not be able to drink that much in that amount of time without getting sick and not being able to keep it down. She asked that the Dr be told that she wanted to cancel the procedure. Dr. Jacqulynn Cadet, Dr. Cleophas Dunker and Farrel Gobble were all messaged and informed of pts decision
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
## 2018-03-02 LAB — HEMOGLOBIN AND HEMATOCRIT
Hematocrit: 27.3 % — ABNORMAL LOW (ref 37–47)
Hematocrit: 25.6 % — ABNORMAL LOW (ref 37–47)
Hematocrit: 27.3 % — ABNORMAL LOW (ref 37–47)
Hemoglobin: 8.5 GM/DL — ABNORMAL LOW (ref 12.5–16.0)
Hemoglobin: 8.1 GM/DL — ABNORMAL LOW (ref 12.5–16.0)
Hemoglobin: 8.5 GM/DL — ABNORMAL LOW (ref 12.5–16.0)

## 2018-03-02 LAB — ECHOCARDIOGRAM LIMITED: Left Ventricular Ejection Fraction: 50

## 2018-03-02 LAB — CRITICAL CARE PANEL
Anion Gap: 13 (ref 4–16)
BUN: 59 MG/DL — ABNORMAL HIGH (ref 6–23)
CO2: 21 MMOL/L (ref 21–32)
Calcium: 7.5 MG/DL — ABNORMAL LOW (ref 8.3–10.6)
Chloride: 104 mMol/L (ref 99–110)
Creatinine: 2.7 MG/DL — ABNORMAL HIGH (ref 0.6–1.1)
GFR African American: 20 mL/min/{1.73_m2} — ABNORMAL LOW (ref >60)
GFR Non-African American: 17 mL/min/{1.73_m2} — ABNORMAL LOW (ref >60)
Glucose: 99 MG/DL (ref 70–99)
Magnesium: 2.1 mg/dl (ref 1.8–2.4)
Phosphorus: 3.9 MG/DL (ref 2.5–4.9)
Potassium: 4.4 MMOL/L (ref 3.5–5.1)
Sodium: 138 MMOL/L (ref 135–145)

## 2018-03-02 LAB — POCT GLUCOSE
POC Glucose: 162 MG/DL — ABNORMAL HIGH (ref 70–99)
POC Glucose: 75 MG/DL (ref 70–99)
POC Glucose: 107 MG/DL — ABNORMAL HIGH (ref 70–99)
POC Glucose: 184 MG/DL — ABNORMAL HIGH (ref 70–99)
POC Glucose: 143 MG/DL — ABNORMAL HIGH (ref 70–99)

## 2018-03-02 MED ORDER — BISACODYL 5 MG PO TBEC
5 MG | Freq: Once | ORAL | Status: AC
Start: 2018-03-02 — End: 2018-03-03
  Administered 2018-03-03: 19:00:00 10 mg via ORAL

## 2018-03-02 MED ORDER — POLYVINYL ALCOHOL 1.4 % OP SOLN
1.4 % | OPHTHALMIC | Status: DC | PRN
Start: 2018-03-02 — End: 2018-03-08
  Administered 2018-03-02 – 2018-03-05 (×3): 1 [drp] via OPHTHALMIC

## 2018-03-02 MED ORDER — BISACODYL 5 MG PO TBEC
5 MG | Freq: Once | ORAL | Status: AC
Start: 2018-03-02 — End: 2018-03-02
  Administered 2018-03-02: 16:00:00 10 mg via ORAL

## 2018-03-02 MED ORDER — POLYETHYLENE GLYCOL 3350 17 G PO PACK
17 g | Freq: Three times a day (TID) | ORAL | Status: DC
Start: 2018-03-02 — End: 2018-03-04
  Administered 2018-03-02 – 2018-03-03 (×4): 17 g via ORAL

## 2018-03-02 MED FILL — FAMOTIDINE 20 MG/2ML IV SOLN: 20 MG/2ML | INTRAVENOUS | Qty: 2

## 2018-03-02 MED FILL — DOK 100 MG PO CAPS: 100 mg | ORAL | Qty: 1

## 2018-03-02 MED FILL — STRESS B-COMPLEX/VIT C/ZINC PO TABS: ORAL | Qty: 1

## 2018-03-02 MED FILL — POLYETHYLENE GLYCOL 3350 17 G PO PACK: 17 g | ORAL | Qty: 1

## 2018-03-02 MED FILL — HYDRALAZINE HCL 50 MG PO TABS: 50 mg | ORAL | Qty: 2

## 2018-03-02 MED FILL — FUROSEMIDE 40 MG PO TABS: 40 mg | ORAL | Qty: 1

## 2018-03-02 MED FILL — ARTIFICIAL TEARS 1.4 % OP SOLN: 1.4 % | OPHTHALMIC | Qty: 300

## 2018-03-02 MED FILL — CARVEDILOL 3.125 MG PO TABS: 3.125 mg | ORAL | Qty: 1

## 2018-03-02 MED FILL — BISACODYL EC 5 MG PO TBEC: 5 mg | ORAL | Qty: 2

## 2018-03-02 MED FILL — NIFEDIPINE ER OSMOTIC RELEASE 30 MG PO TB24: 30 mg | ORAL | Qty: 1

## 2018-03-02 MED FILL — ISOSORBIDE DINITRATE 20 MG PO TABS: 20 mg | ORAL | Qty: 2

## 2018-03-02 NOTE — Progress Notes (Addendum)
I spoke to patient yesterday and she was willing to try the bowel prep and colonoscopy, however changed her mind last night and this was cancelled. She did not feel she could tolerate the gallon Co-Lyte prep.  Due to her renal failure we will not be able to do alternative quick prep such as mag citrate, Gatorade/Miralax, etc  Discussed options with her and she elected to stay in house and do slow 2-day prep with colonoscopy Wednesday morning at 0700

## 2018-03-02 NOTE — Plan of Care (Signed)
Problem: Infection:  Goal: Will remain free from infection  Description  Will remain free from infection  03/02/2018 2057 by Rae Mar, RN  Outcome: Ongoing  03/02/2018 1419 by Ardyth Man, RN  Outcome: Ongoing     Problem: Safety:  Goal: Free from accidental physical injury  Description  Free from accidental physical injury  03/02/2018 2057 by Rae Mar, RN  Outcome: Ongoing  03/02/2018 1419 by Ardyth Man, RN  Outcome: Ongoing     Problem: Safety:  Goal: Free from intentional harm  Description  Free from intentional harm  03/02/2018 2057 by Rae Mar, RN  Outcome: Ongoing  03/02/2018 1419 by Ardyth Man, RN  Outcome: Ongoing     Problem: Daily Care:  Goal: Daily care needs are met  Description  Daily care needs are met  03/02/2018 2057 by Rae Mar, RN  Outcome: Ongoing  03/02/2018 1419 by Ardyth Man, RN  Outcome: Ongoing     Problem: Pain:  Goal: Patient's pain/discomfort is manageable  Description  Patient's pain/discomfort is manageable  03/02/2018 2057 by Rae Mar, RN  Outcome: Ongoing  03/02/2018 1419 by Ardyth Man, RN  Outcome: Ongoing

## 2018-03-02 NOTE — Progress Notes (Addendum)
Hospitalist Progress Note      Name:  Judy Velasquez DOB/Age/Sex: 12-28-1928  (84 y.o. female)   MRN & CSN:  [id number] & [id number] Admission Date/Time: 02/24/2018  9:52 AM   Location:  3001/3001-A PCP: [name], MD         Hospital Day: 7    Assessment and Plan:   Judy Velasquez is a 84 y.o.  female  who presents with Acute renal failure superimposed on stage 4 chronic kidney disease ([place])    PT--> Discharge Recommendations SNF     Acute on chronic anemia secondary to blood loss  Likely UGIB  Hgb 6.8  --> 8.1 s/p 2U during HD  H/H Q8h  Occult blood +Ve  GI consult--> EGD negative. Dr Edwena Felty to decide on C Scopy  slow 2-day prep with colonoscopy Wednesday morning at 0700  Dc heparin      AKI on CKD stage 4  Tunneled cath placed --> HD  Op dialysis next week  Daily weights  I/O  Nephrology on board  Per CM: Pt has been approved for dialysis at [facility] in [place].  ??  DM   Cont sliding scale insulin  CB monitoring  ??  CAD  PAF  hyperlipidemia  hypertension  Cont home meds  Cardio consult  ??    Chronic treatment continued per home medications unless  contraindicated by above plan and assessment.    The above assessment/plan has been explained to the patient, who indicated understanding.      Diet DIET CLEAR LIQUID; No Added Salt (3-4 GM)  Diet NPO Time Specified   DVT Prophylaxis []  Lovenox, []   Heparin, [x]  SCDs, [] No VTE prophylaxis, patient ambulating   GI Prophylaxis []  PPI, [x]  H2 Blocker, []  No GI prophylaxis, patient is receiving diet/Tube Feeds   Code Status Full Code             History of Present Illness:     Chief Complaint: Acute renal failure superimposed on stage 4 chronic kidney disease ([place])    Patient was seen and examined at bedside today. Patient sitting up comfortably in bed in NAD.   Ten point ROS reviewed negative, unless as noted above    Objective:       Intake/Output Summary (Last 24 hours) at 03/02/2018 0914  Last data filed at 03/01/2018 2342  Gross per 24 hour   Intake 150 ml   Output --    Net 150 ml      Vitals:   Vitals:    03/02/18 0845   BP:    Pulse: 63   Resp:    Temp:    SpO2:      Physical Exam:      GEN Awake female, sitting upright in bed in no apparent distress. Appears given age.  EYES Pupils are equally round.  No scleral erythema, discharge, or conjunctivitis.  HENT Mucous membranes are moist.   RESP Clear to auscultation, no wheezes, rales or rhonchi.    CARDIO/VASC S1/S2 auscultated. No murmurs  GI Abdomen is soft without significant tenderness, masses, or guarding.   MSK No gross joint deformities. Spontaneous movement of all extremities  SKIN Normal coloration, warm, dry.  NEURO Grossly normal  PSYCH Awake, alert, oriented     Medications:   Medications:   ??? bisacodyl  10 mg Oral Once   ??? [START ON 03/03/2018] bisacodyl  10 mg Oral Once   ??? polyethylene glycol  17 g Oral Q8H   ???  famotidine (PEPCID) injection  20 mg Intravenous Daily   ??? polyethylene glycol  4,000 mL Oral Once   ??? sodium chloride  250 mL Intravenous Once   ??? darbepoetin alfa-polysorbate  60 mcg Subcutaneous Weekly - Thursday   ??? insulin lispro  0-6 Units Subcutaneous TID WC   ??? insulin lispro  0-3 Units Subcutaneous 2 times per day   ??? 0.9 % sodium chloride  250 mL Intravenous Once   ??? sodium chloride flush  10 mL Intravenous 2 times per day   ??? carvedilol  3.125 mg Oral BID   ??? docusate sodium  100 mg Oral BID   ??? fluticasone  1 spray Nasal Daily   ??? furosemide  40 mg Oral BID   ??? hydrALAZINE  100 mg Oral TID   ??? isosorbide dinitrate  30 mg Oral TID   ??? NIFEdipine  30 mg Oral BID   ??? b complex-C-E-zinc  1 tablet Oral Daily      Infusions:   ??? dextrose       PRN Meds:     acetaminophen 650 mg Q4H PRN   oxyCODONE 2.5 mg Q4H PRN   sodium chloride flush 10 mL PRN   ondansetron 4 mg Q6H PRN   glucose 15 g PRN   dextrose 12.5 g PRN   glucagon (rDNA) 1 mg PRN   dextrose 100 mL/hr PRN         Electronically signed by Bailey Mech, MD on 03/02/2018 at 9:14 AM
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
## 2018-03-02 NOTE — Consults (Signed)
[place] [address]                                  CONSULTATION    PATIENT NAME: Judy Velasquez, Judy Velasquez                       DOB:        [birth year]  MED REC NO:   [id number]                          ROOM:       8338  ACCOUNT NO:   [id number]                           ADMIT DATE: 02/24/2018  PROVIDER:     Scot Jun, MD    CONSULT DATE:  03/02/2018    INDICATION:  Hypertension.    HISTORY OF PRESENT ILLNESS:  This is an 84 year old female patient who  follows in the office.  The patient was seen in the office recently.   The patient is known to have coronary artery disease present.  The  patient has previous CABG done.    The patient had a bypass surgery done.  Her last heart catheterization  was done in 2011.  Left main is patent, LAD was 100% occluded, circ was  80% stenosis, RCA with 80% stenosis, SVG to RCA was patent, SVG to OM  was patent, LIMA to LAD was also patent at that time.  The heart cath  was in 2011.  Echo was done in 12/2017.  LV function was preserved,  severe mitral annular calcification was noted.  Diastolic dysfunction  present.  The patient had an Holter done in 12/2017, sinus rhythm with  episodes of paroxysmal atrial fibrillation is noted and also  nonsustained ventricular tachycardia noted.  Last stress test was done  in 12/2017.  LV function was preserved.  No ischemia was present.    MEDICATIONS AT HOME:  She was on Coreg 3.125 b.i.d., _____, hydralazine  100 t.i.d., isosorbide 10 mg t.i.d., Lasix 40 b.i.d., metolazone and  nifedipine ER 30 mg daily, and Protonix.    PAST MEDICAL HISTORY:  Hypertension, renal insufficiency, worsening  renal function and the patient was started on dialysis recently.  A  tunneled catheter was placed.  She had 2 dialysis sessions so far.    ALLERGIES:  CELEBREX, FISH OIL and FISH.    MEDICATIONS:  Lisinopril, Neurontin, Norvasc, Lipitor, Cipro, and  losartan.    PHYSICAL  EXAMINATION:  GENERAL:  The patient is awake, alert, answering questions.  VITAL SIGNS:  Temperature afebrile, pulse is 63, blood pressure 117/45.   Sinus rhythm.  HEENT:  Head is normocephalic, atraumatic.  Pupils are equal and  reactive.  CHEST:  Equal expansion.  LUNGS:  Clear to auscultation.  No wheezing or rhonchi.  HEART:  Regular rhythm.  ABDOMEN:  Soft, nontender.  Bowel sounds present.  No hepatosplenomegaly  or guarding appreciated.  EXTREMITIES:  No cyanosis or clubbing noted.  NEUROLOGIC:  Cranial nerves II through XII are grossly intact.    LABORATORY DATA:  Her creatinine is 2.7.  LFTs are within normal range.   Hemoglobin is 8.9.    IMPRESSION:  This is an 84 year old female patient who was admitted to  the hospital with having anemia and renal failure.  The patient is  started on dialysis.  The patient had two sessions of dialysis so far.   GI workup is also going.  Plan is for her to have a possible EGD and  possibly to have colonoscopy to rule out the source of bleeding and  anemia.    From a cardiac stand, she had a negative EGD and the plan is for  colonoscopy.    From a cardiac stand, I will continue her blood pressure medications.        Scot Jun, MD    D: 03/02/2018 11:49:22       T: 03/02/2018 13:01:32     [id number]  Job#: [id number]     Doc#: [id number]    CC:

## 2018-03-02 NOTE — Progress Notes (Signed)
Patient has been consistently refusing SCDs this shift. Educated on benefits and risks, still refusing

## 2018-03-02 NOTE — Progress Notes (Signed)
Occupational Therapy  [facility] OCCUPATIONAL THERAPY EVALUATION    History  HOPI:  There were no encounter diagnoses.      Restrictions:  Restrictions/Precautions  Restrictions/Precautions: Fall Risk                        Communication with other providers: PT  Subjective:  Patient states:  "I am much weaker than normal" "going to dialysis I brand new for me"  Pain:  8/10 buttocks (skin breakdown) and R shldr (arthritis)  Patient goal:  home    Occupational profile (relevant social history and personal factors):    Social/Functional History  Lives With: Family(grnddtr and 82y.o. and other family)  Type of Home: House  Home Layout: Two level, Able to Live on Main level with bedroom/bathroom  Home Access: Stairs to enter with rails  Entrance Stairs - Number of Steps: 3 stairs c help from family  Entrance Stairs - Rails: Both  Bathroom Shower/Tub: Medical sales representative: Bedside commode(kept next to lift chair)  Bathroom Equipment: Tub transfer Librarian, academic: Lift chair, 4 wheeled walker, [employer]  Receives Help From: Home health(nurse 1x/wk, PT amd OT 2x/wk)  ADL Assistance: Needs assistance(pt toilets c modified independence, but family assists prn with bathing and dressing)  Homemaking Responsibilities: No  Ambulation Assistance: Independent(c 4WW short distances ; w/c out in community)  Transfer Assistance: Independent(except for tub t/f which family assists with)  Active Driver: No  Patient's Driver Info: dtr  Additional Comments: recent hx of falls    Examination of body systems (includes body structures/functions, activity/participation limitations):  ?? Orientation: WFL  ?? Cognition:  WFL  ?? Observation:  Received pt in chair. alert.   ?? Vision:  Low vision  ?? Hearing:  Mild hardness of hearing  ?? ROM:  WFL BUE  ?? Strength: functional and general weakness throughout BUE, exacerbated from baseline  ?? Sensation: WFL BUE    ADLs  Feeding: independent    Grooming: pt washed hands sinkside with  CGA for steadying. Unable to tolerate further standing at sink for other grooming tasks but was able to complete facial washing seated in chair    Dressing: UB predict CGA at least,  in seated LB ModA. Pt able to manage socks , but unable to thread. Completed most of hiking up/down except for final hike up over buttock.     Bathing: UB predict CGA in seated LB predict MinA    Toileting: MinA. Assisted c pants hike up. Pt managed pericare in seated.     *Some ADL determined per observation of actual ADL performance, functional mobility, balance, activity tolerance, and cognition.     AM-PAC 6 click short form for inpatient daily activity:    24/24 = unimpaired  23/24 = 1-20% impaired   20/24-22/24 = 21-40% impaired  15/24-19/24 = 41-59% impaired   10/24-14/24 = 60%-79% impaired  7/24-9/24 = 80%-99% impaired  6/24 = 100% impaired    Functional Mobility  Bed mobility:   Supine to sit: not tested    Sitting balance: supervision on commode    Transfers:   Sit to stand: CGA   Stand to sit: CGA  Cues for safe hand placement. Completed at chair and low commode    Standing balance: CGA c 0-2 UE support    Ambulation:  CGA to MinA c RW to/from bathroom. See PT evaluation for full gait assessment and recommendations.     Activity tolerance  Fair+. Functionally  weak and fatigues quickly.     Assessment:  Assessment  Performance deficits / Impairments: Decreased functional mobility , Decreased ADL status, Decreased strength, Decreased safe awareness, Decreased balance, Decreased endurance  Treatment Diagnosis: ESRD, functional weakness  Prognosis: Good  Decision Making: Medium Complexity  REQUIRES OT FOLLOW UP: Yes  Discharge Recommendations: Subacute/Skilled Nursing Facility      Goals:  By d/c or goals met:     Pt will perform all bed mobility with CGA in prep for EOB/OOB activity.   Pt will perform all functional transfers with SBA and appropriate use of LRD to bed, toilet, chair in prep for increased functional independence.    Pt will perform UB ADLs with SBA in seated to increase functional independence.  Pt will perform LB ADLs with MinA to increase functional independence.    Pt will perform all aspects of toileting with CGA to increase functional independence.   Pt will participate in therex/therax c emphasis on strength, activity tolerance,  safety, MODI tasks.    Plan:  Plan  Times per week: 2x    Treatment today:    Self Care Training:   Self care training was performed today.  Cues were given for safety, sequence, UE/LE placement, visual cues, and balance.    Activities performed today included dressing, toileting, hand hygiene, and grooming.    Education: Role of OT, OT POC, d/c needs, home safety    Safety: Left in chair with all needs in reach.   Gait belt used for transfer and mobility.      Time in:  1350  Time out:  1415  Timed treatment minutes:  10  Total treatment time:  25    Electronically signed by:   [address], [place], [place]   [id number]   4:02 PM, 03/02/2018

## 2018-03-02 NOTE — Plan of Care (Signed)
Problem: Infection:  Goal: Will remain free from infection  Description  Will remain free from infection  03/02/2018 1419 by Richardson Dopp, RN  Outcome: Ongoing  03/02/2018 0048 by Darnelle Bos, RN  Outcome: Ongoing     Problem: Safety:  Goal: Free from accidental physical injury  Description  Free from accidental physical injury  03/02/2018 1419 by Richardson Dopp, RN  Outcome: Ongoing  03/02/2018 0048 by Darnelle Bos, RN  Outcome: Ongoing  Goal: Free from intentional harm  Description  Free from intentional harm  03/02/2018 1419 by Richardson Dopp, RN  Outcome: Ongoing  03/02/2018 0048 by Darnelle Bos, RN  Outcome: Ongoing     Problem: Daily Care:  Goal: Daily care needs are met  Description  Daily care needs are met  03/02/2018 1419 by Richardson Dopp, RN  Outcome: Ongoing  03/02/2018 0048 by Darnelle Bos, RN  Outcome: Ongoing     Problem: Pain:  Goal: Patient's pain/discomfort is manageable  Description  Patient's pain/discomfort is manageable  03/02/2018 1419 by Richardson Dopp, RN  Outcome: Ongoing  03/02/2018 0048 by Darnelle Bos, RN  Outcome: Met This Shift  Goal: Pain level will decrease  Description  Pain level will decrease  03/02/2018 1419 by Richardson Dopp, RN  Outcome: Ongoing  03/02/2018 0048 by Darnelle Bos, RN  Outcome: Met This Shift  Goal: Control of acute pain  Description  Control of acute pain  03/02/2018 1419 by Richardson Dopp, RN  Outcome: Ongoing  03/02/2018 0048 by Darnelle Bos, RN  Outcome: Met This Shift  Goal: Control of chronic pain  Description  Control of chronic pain  03/02/2018 1419 by Richardson Dopp, RN  Outcome: Ongoing  03/02/2018 0048 by Darnelle Bos, RN  Outcome: Met This Shift     Problem: Discharge Planning:  Goal: Patients continuum of care needs are met  Description  Patients continuum of care needs are met  03/02/2018 1419 by Richardson Dopp, RN  Outcome: Ongoing  03/02/2018 0048 by Darnelle Bos, RN  Outcome: Ongoing     Problem: Falls - Risk of:  Goal: Will remain  free from falls  Description  Will remain free from falls  03/02/2018 1419 by Richardson Dopp, RN  Outcome: Ongoing  03/02/2018 0048 by Darnelle Bos, RN  Outcome: Ongoing  Goal: Absence of physical injury  Description  Absence of physical injury  03/02/2018 1419 by Richardson Dopp, RN  Outcome: Ongoing  03/02/2018 0048 by Darnelle Bos, RN  Outcome: Ongoing     Problem: Risk for Impaired Skin Integrity  Goal: Tissue integrity - skin and mucous membranes  Description  Structural intactness and normal physiological function of skin and  mucous membranes.  03/02/2018 1419 by Richardson Dopp, RN  Outcome: Ongoing  03/02/2018 0048 by Darnelle Bos, RN  Outcome: Ongoing

## 2018-03-02 NOTE — Consults (Signed)
Dictated -[id number]  Hx of CAD s/p CABG   Anemia EGD negative for c-scope   Renal failure on HD has temporary catheter  HTN adjust meds --BP was low yesterday and held meds  Will keep her on coreg and procardia for now  Stop hydralazine and imdur and resume based on BP

## 2018-03-02 NOTE — Progress Notes (Signed)
[facility] [address] [place] [address]  Phone: [phone]  Office Hours: 8:30AM - 4:30PM  Monday - Friday     Nephrology Progress Note  03/02/2018 7:48 AM  Subjective:   Admit Date: 02/24/2018  PCP: [name], MD  Interval History:  Nausea resolved   EGD negative  Appetite poor  Did not have scope- not prep  Diet: DIET CLEAR LIQUID; No Added Salt (3-4 GM)  Diet NPO Time Specified      Data:   Scheduled Meds:  ??? bisacodyl  10 mg Oral Once   ??? [START ON 03/03/2018] bisacodyl  10 mg Oral Once   ??? polyethylene glycol  17 g Oral Q8H   ??? famotidine (PEPCID) injection  20 mg Intravenous Daily   ??? polyethylene glycol  4,000 mL Oral Once   ??? sodium chloride  250 mL Intravenous Once   ??? darbepoetin alfa-polysorbate  60 mcg Subcutaneous Weekly - Thursday   ??? insulin lispro  0-6 Units Subcutaneous TID WC   ??? insulin lispro  0-3 Units Subcutaneous 2 times per day   ??? 0.9 % sodium chloride  250 mL Intravenous Once   ??? sodium chloride flush  10 mL Intravenous 2 times per day   ??? carvedilol  3.125 mg Oral BID   ??? docusate sodium  100 mg Oral BID   ??? fluticasone  1 spray Nasal Daily   ??? furosemide  40 mg Oral BID   ??? hydrALAZINE  100 mg Oral TID   ??? isosorbide dinitrate  30 mg Oral TID   ??? NIFEdipine  30 mg Oral BID   ??? b complex-C-E-zinc  1 tablet Oral Daily     Continuous Infusions:  ??? dextrose       PRN Meds:acetaminophen, oxyCODONE, sodium chloride flush, ondansetron, glucose, dextrose, glucagon (rDNA), dextrose  I/O last 3 completed shifts:  In: 150 [P.O.:150]  Out: -   No intake/output data recorded.    Intake/Output Summary (Last 24 hours) at 03/02/2018 0748  Last data filed at 03/01/2018 2342  Gross per 24 hour   Intake 150 ml   Output --   Net 150 ml     CBC:   Recent Labs     03/01/18  0625 03/01/18  1522 03/01/18  2310   HGB 8.9* 8.3* 8.5*     BMP:    Recent Labs     02/28/18  0127 03/01/18  0625 03/02/18  0344   NA 137 135 138   K 4.1 4.3 4.4   CL 104 101 104   CO2 23 22 21    BUN 23 45* 59*    CREATININE 1.1 2.3* 2.7*   GLUCOSE 151* 89 99     Hepatic:   No results for input(s): AST, ALT, ALB, BILITOT, ALKPHOS in the last 72 hours.  Troponin: No results for input(s): TROPONINI in the last 72 hours.  BNP: No results for input(s): BNP in the last 72 hours.  Lipids: No results for input(s): CHOL, HDL in the last 72 hours.    Invalid input(s): LDLCALCU  ABGs: No results found for: PHART, PO2ART, PCO2ART  INR:   No results for input(s): INR in the last 72 hours.    Objective:   Vitals: BP (!) 149/37    Pulse 58    Temp 98 ??F (36.7 ??C) (Oral)    Resp 18    Ht 4\' 7"  (1.397 m)    Wt 87 lb 15.4 oz (  39.9 kg)    SpO2 98%    BMI 20.44 kg/m??   General appearance: alert and cooperative with exam cachectic  HEENT: Head: Normal, normocephalic, atraumatic.  Neck: no adenopathy, no carotid bruit, no JVD, supple, symmetrical, trachea midline and thyroid not enlarged, symmetric, no tenderness/mass/nodules  Lungs: clear to auscultation bilaterally  Heart: regular rate and rhythm, S1, S2 normal, no murmur, click, rub or gallop  Abdomen: soft, non-tender; bowel sounds normal; no masses,  no organomegaly  Extremities: extremities normal, atraumatic, no cyanosis or edema  Neurologic: Mental status: alertness: alert    Assessment and Plan:     Patient Active Problem List:     Type II or unspecified type diabetes mellitus without mention of complication, not stated as uncontrolled     Shortness of breath on exertion     Anemia of chronic disease     Kidney problem     CAD (coronary artery disease)     Arthritis     Chest pain     Essential hypertension     PAF (paroxysmal atrial fibrillation) (HCC)     Stage 4 chronic kidney disease (HCC)     ARF (acute renal failure) (HCC)     AKI (acute kidney injury) (HCC)     Traumatic seroma of left lower leg     Acute renal failure superimposed on stage 4 chronic kidney disease (HCC)     Uncontrolled type 2 diabetes mellitus (HCC)     Acute on chronic blood loss anemia (HCC)  IMP  AKI on  CKD4 improved after dialysis-  Uremia better   Acidosis resolved  Anemia - post transfusion- adequate iron stores- Hb 8.4 slowly dropping      Plan    Dialysis in am  GI work up in progress- important to know if she has an underlying bleeding source esp if underlying malignancy- will change the management  Op dialysis next week    Claretta Fraise, MD

## 2018-03-02 NOTE — Progress Notes (Signed)
Physical Therapy  [facility] ACUTE CARE PHYSICAL THERAPY EVALUATION  Judy Velasquez, 01-18-1929, 3001/3001-A, 03/02/2018    History  HOPI:  There were no encounter diagnoses.  Patient  has a past medical history of Allergic rhinitis, Anemia, Anesthesia, Anxiety, Arthritis, Back problem, Blood transfusion, CAD (coronary artery disease), DCIS (ductal carcinoma in situ), Glaucoma, H/O cardiovascular stress test, Heart attack ([place]), HOH (hard of hearing), Hyperlipidemia, Hypertension, IBS (irritable bowel syndrome), Kidney problem, Nausea & vomiting, Osteoarthritis, Osteoporosis, Right Breast Cancer, Shortness of breath on exertion, Staph infection, TB (tuberculosis), and Type II or unspecified type diabetes mellitus without mention of complication, not stated as uncontrolled.  Patient  has a past surgical history that includes Cesarean section ("Late 1950's or Early 1960's"); Carpal tunnel release (2000's); Breast surgery (7-12); joint replacement (1980's); joint replacement (2000's); joint replacement (1990's); joint replacement (1980's); other surgical history; Hysterectomy, total abdominal (1960's); Cardiac surgery (2008); other surgical history (2000's); Colonoscopy (2000's); Endoscopy, colon, diagnostic (1990's); Tonsillectomy and adenoidectomy (1939); Cholecystectomy, laparoscopic (1990's); Breast lumpectomy (05/20/2011); Coronary artery bypass graft (2008); Upper gastrointestinal endoscopy (N/A, 09/18/2017); and Upper gastrointestinal endoscopy (N/A, 02/28/2018).    Subjective:  Patient states:  "I've been a lot weaker, they say my blood is low"   Pain:  8/10 buttock  and R shoulder (arthritis)  Communication with other providers:  Handoff to RN, co-eval with OT.   Restrictions: general precautions, fall risk, chair alarm, portable tele, pulse ox, BP cuff     Home Setup/Prior level of function  Social/Functional History  Lives With: Family(dtr and grnddtr)   Type of Home: House  Home Layout: Two level,  Able to Live on Main level with bedroom/bathroom  Home Access: Stairs to enter with rails (bilat)  Entrance Stairs - Number of Steps: 3 stairs (c help from family)  Entrance Stairs - Rails: Both  Bathroom Shower/Tub: Administrator, Civil Service: Bedside commode(kept next to lift chair)  Bathroom Equipment: Tub transfer Psychologist, prison and probation services: Lift chair, 4 wheeled walker, [place] Help From: Home health(nurse 1x/wk, PT amd OT 2x/wk)  ADL Assistance: Needs assistance(pt toilets c modified independence, but family assists prn with bathing and dressing)  Homemaking Responsibilities: No  Ambulation Assistance: Independent(c 4WW short distances; w/c out in community)  Transfer Assistance: Independent(except for tub t/f which family assists with)  Active Driver: No  Patient's Driver Info: dtr  Additional Comments: recent hx of falls    Examination of body systems (includes body structures/functions, activity/participation limitations):  ?? Observation:  Sitting in recliner upon arrival. Agreeable to work with PT/OT  ?? Vision:  Poor vision, legally blind. Everything is "dark". Had a poor outcome from cataract surgery.   ?? Hearing:  [facility]   ?? Cardiopulmonary:  Stable on room air   ?? Orientation: [facility]     Musculoskeletal  ?? ROM R/L:  WFL BLEs.    ?? Strength R/L:  Observed in function as grossly 3+/5 BLEs. Fair in function and endurance.    ?? Neuro:  [facility]     Mobility/treatment:  ?? Rolling L/R:  NT   ?? Supine to sit:  NT  ?? Transfers: sit>stand CGA from recliner/standard toilet, stand>sit to recliner/toilet minA for steadying. Use of grab bar in bathroom. Step pivot CGA with RW to each surface.   ?? Sitting balance:   SBA edge of recliner/toilet   ?? Standing balance:  CGA at sink and toilet performing self care tasks with at least single UE support.   ?? Gait: 19ftx  2 with RW CGA/minA for balance. Dec pace, 1 pause required due to feeling SOB, dec foot clearance and step length.   ?? Educated on POC, role of  PT, DME, discharge recommendation. VCs for sequencing, posture, balance, weight shift, hand placement to inc independence and safety with mobility.     AMPAC 6 Clicks Inpatient Mobility:  AM-PAC Inpatient Mobility Raw Score : 20    Safety: patient left in chair with chair alarm, call light within reach, RN notified, gait belt used.    Assessment:  Body structures, Functions, Activity limitations: Decreased functional mobility ; Decreased safe awareness; Decreased endurance; Decreased balance; Increased Pain; Decreased ADL status; Decreased strength; Decreased vision/visual deficit  Pt is an 84 year old female admitted with acute renal failure superimposed on stg 4 chronic kidney disease. Recommend subacute rehab once medically stable. Prior to admission pt was modI with Rollator to access home and needed assistance with ADLs. Pt is currently CGA/minA ambulating less than household distances. Pt functioning below her typical baseline and would benefit from continue therapy to address deficits, dec potential fall risk, and restore PLOF.   Complexity: Moderate  Prognosis: Good, no significant barriers to participation at this time.   Plan Times per week: 3+/week, 1 week,   Discharge Recommendations: Subacute/Skilled Nursing Facility  Equipment: continue to assess     Goals:  Short term goals  Time Frame for Short term goals: 1 week   Short term goal 1: Pt will perform sit><supine SBA   Short term goal 2: Pt will transfer to bed/recliner SBA with RW   Short term goal 3: Pt will ambulate 51ft with RW SBA   Short term goal 4: Pt will ascend/descend 3 steps, 1-2 rails SBA        Treatment plan:  Strengthening; Training and development officer; Psychiatrist; Psychologist, clinical; Engineer, production; Personnel officer; IT trainer; Dentist; Home Exercise Program; Child psychotherapist; Brewing technologist; Modalities; Positioning; Barrister's clerk,  Education, Visual merchandiser; Safety Education & Training  Recommendations for NURSING mobility: ambulate to bathroom with RW and up to recliner daily.     Time:   Time in: 1350  Time out: 1413  Timed treatment minutes: 10  Total time: 23    Electronically signed by:    Catalina Lunger, [id number]  03/02/2018, 4:31 PM

## 2018-03-02 NOTE — Plan of Care (Signed)
Problem: Infection:  Goal: Will remain free from infection  Description  Will remain free from infection  Outcome: Ongoing     Problem: Safety:  Goal: Free from accidental physical injury  Description  Free from accidental physical injury  Outcome: Ongoing  Goal: Free from intentional harm  Description  Free from intentional harm  Outcome: Ongoing     Problem: Daily Care:  Goal: Daily care needs are met  Description  Daily care needs are met  Outcome: Ongoing     Problem: Pain:  Goal: Patient's pain/discomfort is manageable  Description  Patient's pain/discomfort is manageable  Outcome: Met This Shift  Goal: Pain level will decrease  Description  Pain level will decrease  Outcome: Met This Shift  Goal: Control of acute pain  Description  Control of acute pain  Outcome: Met This Shift  Goal: Control of chronic pain  Description  Control of chronic pain  Outcome: Met This Shift     Problem: Discharge Planning:  Goal: Patients continuum of care needs are met  Description  Patients continuum of care needs are met  Outcome: Ongoing     Problem: Falls - Risk of:  Goal: Will remain free from falls  Description  Will remain free from falls  Outcome: Ongoing  Goal: Absence of physical injury  Description  Absence of physical injury  Outcome: Ongoing     Problem: Risk for Impaired Skin Integrity  Goal: Tissue integrity - skin and mucous membranes  Description  Structural intactness and normal physiological function of skin and  mucous membranes.  Outcome: Ongoing

## 2018-03-03 LAB — CRITICAL CARE PANEL
Anion Gap: 13 (ref 4–16)
BUN: 68 MG/DL — ABNORMAL HIGH (ref 6–23)
CO2: 19 MMOL/L — ABNORMAL LOW (ref 21–32)
Calcium: 7.7 MG/DL — ABNORMAL LOW (ref 8.3–10.6)
Chloride: 102 mMol/L (ref 99–110)
Creatinine: 3 MG/DL — ABNORMAL HIGH (ref 0.6–1.1)
GFR African American: 18 mL/min/{1.73_m2} — ABNORMAL LOW (ref >60)
GFR Non-African American: 15 mL/min/{1.73_m2} — ABNORMAL LOW (ref >60)
Glucose: 94 MG/DL (ref 70–99)
Magnesium: 2.1 mg/dl (ref 1.8–2.4)
Phosphorus: 4.8 MG/DL (ref 2.5–4.9)
Potassium: 4.4 MMOL/L (ref 3.5–5.1)
Sodium: 134 MMOL/L — ABNORMAL LOW (ref 135–145)

## 2018-03-03 LAB — HEMOGLOBIN AND HEMATOCRIT
Hematocrit: 25.3 % — ABNORMAL LOW (ref 37–47)
Hematocrit: 24.3 % — ABNORMAL LOW (ref 37–47)
Hematocrit: 27.4 % — ABNORMAL LOW (ref 37–47)
Hemoglobin: 7.9 GM/DL — ABNORMAL LOW (ref 12.5–16.0)
Hemoglobin: 7.7 GM/DL — ABNORMAL LOW (ref 12.5–16.0)
Hemoglobin: 8.6 GM/DL — ABNORMAL LOW (ref 12.5–16.0)

## 2018-03-03 LAB — BASIC METABOLIC PANEL
Anion Gap: 13 (ref 4–16)
BUN: 69 MG/DL — ABNORMAL HIGH (ref 6–23)
CO2: 19 MMOL/L — ABNORMAL LOW (ref 21–32)
Calcium: 7.7 MG/DL — ABNORMAL LOW (ref 8.3–10.6)
Chloride: 101 mMol/L (ref 99–110)
Creatinine: 3 MG/DL — ABNORMAL HIGH (ref 0.6–1.1)
GFR African American: 18 mL/min/{1.73_m2} — ABNORMAL LOW (ref >60)
GFR Non-African American: 15 mL/min/{1.73_m2} — ABNORMAL LOW (ref >60)
Glucose: 135 MG/DL — ABNORMAL HIGH (ref 70–99)
Potassium: 4.4 MMOL/L (ref 3.5–5.1)
Sodium: 133 MMOL/L — ABNORMAL LOW (ref 135–145)

## 2018-03-03 LAB — POCT GLUCOSE
POC Glucose: 114 MG/DL — ABNORMAL HIGH (ref 70–99)
POC Glucose: 102 MG/DL — ABNORMAL HIGH (ref 70–99)
POC Glucose: 158 MG/DL — ABNORMAL HIGH (ref 70–99)

## 2018-03-03 MED ORDER — HEPARIN SODIUM (PORCINE) 1000 UNIT/ML IJ SOLN
1000 UNIT/ML | Freq: Once | INTRAMUSCULAR | Status: AC
Start: 2018-03-03 — End: 2018-03-03
  Administered 2018-03-03: 17:00:00 3500 [IU]

## 2018-03-03 MED ORDER — PILL SPLITTER MISC
Status: AC
Start: 2018-03-03 — End: 2018-03-04

## 2018-03-03 MED ORDER — PILL SPLITTER MISC
Status: AC
Start: 2018-03-03 — End: 2018-03-03
  Administered 2018-03-03: 08:00:00

## 2018-03-03 MED ORDER — EPOETIN ALFA 4000 UNIT/ML IJ SOLN
4000 UNIT/ML | Freq: Once | INTRAMUSCULAR | Status: AC
Start: 2018-03-03 — End: 2018-03-03
  Administered 2018-03-03: 16:00:00 8000 [IU] via INTRAVENOUS

## 2018-03-03 MED FILL — POLYETHYLENE GLYCOL 3350 17 G PO PACK: 17 g | ORAL | Qty: 1

## 2018-03-03 MED FILL — EPOGEN 4000 UNIT/ML IJ SOLN: 4000 [IU]/mL | INTRAMUSCULAR | Qty: 2

## 2018-03-03 MED FILL — FUROSEMIDE 40 MG PO TABS: 40 mg | ORAL | Qty: 1

## 2018-03-03 MED FILL — BISACODYL EC 5 MG PO TBEC: 5 mg | ORAL | Qty: 2

## 2018-03-03 MED FILL — OXYCODONE HCL 5 MG PO TABS: 5 mg | ORAL | Qty: 1

## 2018-03-03 MED FILL — CARVEDILOL 3.125 MG PO TABS: 3.125 mg | ORAL | Qty: 1

## 2018-03-03 MED FILL — PAIN & FEVER 325 MG PO TABS: 325 mg | ORAL | Qty: 2

## 2018-03-03 MED FILL — STRESS B-COMPLEX/VIT C/ZINC PO TABS: ORAL | Qty: 1

## 2018-03-03 MED FILL — PILL SPLITTER MISC: Qty: 1

## 2018-03-03 MED FILL — HEPARIN SODIUM (PORCINE) 1000 UNIT/ML IJ SOLN: 1000 [IU]/mL | INTRAMUSCULAR | Qty: 4

## 2018-03-03 MED FILL — NIFEDIPINE ER OSMOTIC RELEASE 30 MG PO TB24: 30 mg | ORAL | Qty: 1

## 2018-03-03 MED FILL — DOK 100 MG PO CAPS: 100 mg | ORAL | Qty: 1

## 2018-03-03 MED FILL — ONDANSETRON HCL 4 MG/2ML IJ SOLN: 4 MG/2ML | INTRAMUSCULAR | Qty: 2

## 2018-03-03 NOTE — Progress Notes (Signed)
Daily Progress Note     patient is awake alert doing ok  For c-scope tomorrow  Heart rate and BP stable  PVC noted on monitor  Renal failure on HD  Anemia for scope tomorrow  Hx of CAD s/p CABG --  Hx of PAFIB  Supportive care    Will review echo      The patient had a bypass surgery done.  Her last heart catheterization  was done in 2011.  Left main is patent, LAD was 100% occluded, circ was  80% stenosis, RCA with 80% stenosis, SVG to RCA was patent, SVG to OM  was patent, LIMA to LAD was also patent at that time.  The heart cath  was in 2011.  Echo was done in 12/2017.  LV function was preserved,  severe mitral annular calcification was noted.  Diastolic dysfunction  present.  The patient had an Holter done in 12/2017, sinus rhythm with  episodes of paroxysmal atrial fibrillation is noted and also  nonsustained ventricular tachycardia noted.  Last stress test was done  in 12/2017.  LV function was preserved.  No ischemia was present.         Objective:   BP (!) 121/43    Pulse 67    Temp 98.4 ??F (36.9 ??C)    Resp 20    Ht 4\' 7"  (1.397 m)    Wt 82 lb 14.3 oz (37.6 kg)    SpO2 98%    BMI 19.27 kg/m??       Intake/Output Summary (Last 24 hours) at 03/03/2018 1114  Last data filed at 03/03/2018 0045  Gross per 24 hour   Intake 60 ml   Output --   Net 60 ml       Medications:   Scheduled Meds:  ??? epoetin  8,000 Units Intravenous Once in dialysis   ??? bisacodyl  10 mg Oral Once   ??? polyethylene glycol  17 g Oral Q8H   ??? famotidine (PEPCID) injection  20 mg Intravenous Daily   ??? polyethylene glycol  4,000 mL Oral Once   ??? sodium chloride  250 mL Intravenous Once   ??? darbepoetin alfa-polysorbate  60 mcg Subcutaneous Weekly - Thursday   ??? insulin lispro  0-6 Units Subcutaneous TID WC   ??? insulin lispro  0-3 Units Subcutaneous 2 times per day   ??? 0.9 % sodium chloride  250 mL Intravenous Once   ??? sodium chloride flush  10 mL Intravenous 2 times per day   ??? carvedilol  3.125 mg Oral BID   ??? docusate sodium  100 mg Oral BID    ??? fluticasone  1 spray Nasal Daily   ??? furosemide  40 mg Oral BID   ??? NIFEdipine  30 mg Oral BID   ??? b complex-C-E-zinc  1 tablet Oral Daily      Infusions:  ??? dextrose        PRN Meds:  polyvinyl alcohol, acetaminophen, oxyCODONE, sodium chloride flush, ondansetron, glucose, dextrose, glucagon (rDNA), dextrose       Physical Exam:  Vitals:    03/03/18 1100   BP: (!) 121/43   Pulse: 67   Resp: 20   Temp:    SpO2: 98%        General: awake alert   Chest: Nontender  Cardiac: sinus   Lungs:Clear to auscultation and percussion.  Abdomen: Soft, NT, ND, +BS  Extremities: no edema   Vascular:  Equal 2+ peripheral pulses.  Lab Data:  CBC:   Recent Labs     03/02/18  1141 03/02/18  2326 03/03/18  0629   HGB 8.5* 7.9* 7.7*   HCT 27.3* 25.3* 24.3*     BMP:   Recent Labs     03/01/18  0625 03/02/18  0344 03/03/18  0629   NA 135 138 134*   K 4.3 4.4 4.4   CL 101 104 102   CO2 22 21 19*   PHOS 3.4 3.9 4.8   BUN 45* 59* 68*   CREATININE 2.3* 2.7* 3.0*     LIVER PROFILE: No results for input(s): AST, ALT, LIPASE, BILIDIR, BILITOT, ALKPHOS in the last 72 hours.    Invalid input(s):  AMYLASE,  ALB  PT/INR: No results for input(s): PROTIME, INR in the last 72 hours.  APTT: No results for input(s): APTT in the last 72 hours.  BNP:  No results for input(s): BNP in the last 72 hours.      Assessment:  Patient Active Problem List    Diagnosis Date Noted   ??? Anemia due to chronic kidney disease 02/27/2018   ??? Acute renal failure superimposed on stage 4 chronic kidney disease ([place]) 02/24/2018   ??? Uncontrolled type 2 diabetes mellitus ([place]) 02/24/2018   ??? Acute on chronic blood loss anemia 02/24/2018   ??? Traumatic seroma of left lower leg ([place]) 09/19/2017   ??? AKI (acute kidney injury) ([place]) 09/16/2017   ??? ARF (acute renal failure) ([place]) 09/15/2017   ??? Essential hypertension 04/25/2017   ??? PAF (paroxysmal atrial fibrillation) ([place]) 04/25/2017   ??? Stage 4 chronic kidney disease ([place]) 04/25/2017   ??? Chest pain 04/28/2015   ??? Type II or  unspecified type diabetes mellitus without mention of complication, not stated as uncontrolled    ??? Shortness of breath on exertion    ??? Anemia of chronic disease    ??? Kidney problem    ??? CAD (coronary artery disease)    ??? Arthritis        Scot Jun, MD 03/03/2018 11:14 AM

## 2018-03-03 NOTE — Progress Notes (Signed)
Progress Note( Dr. Loel Dubonnet)  03/03/2018  Subjective:   Admit Date: 02/24/2018  PCP: Judy Der, MD    Admitted For : Severe anemia with hemoglobin meal was 3+    Consulted For: Better control of blood glucose    Interval History: Patient is much better more alert and awake have normal communications    Denies any chest pains,   Mild to moderate SOB .   Denies nausea or vomiting.   No new bowel or bladder symptoms.       Intake/Output Summary (Last 24 hours) at 03/03/2018 0734  Last data filed at 03/03/2018 0045  Gross per 24 hour   Intake 60 ml   Output --   Net 60 ml       DATA    CBC:   Recent Labs     03/02/18  0344 03/02/18  1141 03/02/18  2326   HGB 8.1* 8.5* 7.9*    CMP:  Recent Labs     03/01/18  0625 03/02/18  0344   NA 135 138   K 4.3 4.4   CL 101 104   CO2 22 21   BUN 45* 59*   CREATININE 2.3* 2.7*   CALCIUM 7.8* 7.5*     Lipids: No results found for: CHOL, HDL, TRIG  Glucose:  Recent Labs     03/02/18  1738 03/02/18  2101 03/03/18  0641   POCGLU 143* 114* 102*     HemoglobinA1C:  Lab Results   Component Value Date    LABA1C 5.0 02/25/2018     High Sensitivity TSH: No results found for: TSHHS  Free T3: No results found for: FT3  Free T4:No results found for: T4FREE    Xr Chest Portable    Result Date: 02/24/2018  EXAMINATION: ONE XRAY VIEW OF THE CHEST 02/24/2018 2:37 pm COMPARISON: Chest x-ray August 18, 2017 HISTORY: ORDERING SYSTEM PROVIDED HISTORY: post cvc      1. Right IJ central venous catheter tip at the level of the cavoatrial junction.  No pneumothorax identified.       Scheduled Medicines   Medications:   ??? bisacodyl  10 mg Oral Once   ??? polyethylene glycol  17 g Oral Q8H   ??? famotidine (PEPCID) injection  20 mg Intravenous Daily   ??? polyethylene glycol  4,000 mL Oral Once   ??? sodium chloride  250 mL Intravenous Once   ??? darbepoetin alfa-polysorbate  60 mcg Subcutaneous Weekly - Thursday   ??? insulin lispro  0-6 Units Subcutaneous TID WC   ??? insulin lispro  0-3 Units Subcutaneous 2 times per day    ??? 0.9 % sodium chloride  250 mL Intravenous Once   ??? sodium chloride flush  10 mL Intravenous 2 times per day   ??? carvedilol  3.125 mg Oral BID   ??? docusate sodium  100 mg Oral BID   ??? fluticasone  1 spray Nasal Daily   ??? furosemide  40 mg Oral BID   ??? NIFEdipine  30 mg Oral BID   ??? b complex-C-E-zinc  1 tablet Oral Daily      Infusions:   ??? dextrose           Objective:   Vitals: BP (!) 153/44    Pulse 64    Temp 98 ??F (36.7 ??C) (Oral)    Resp 18    Ht 4\' 7"  (1.397 [name])    Wt 90 lb 13.3 oz (41.2 kg)  SpO2 100%    BMI 21.11 kg/[name]??   General appearance: alert and cooperative with exam  Neck: no JVD or bruit  Thyroid : Normal lobes   Lungs: Has Vesicular Breath sounds   Heart:  regular rate and rhythm  Abdomen: soft, non-tender; bowel sounds normal; no masses,  no organomegaly  Musculoskeletal: Normal  Extremities: extremities normal, , no edema  Neurologic:  Awake, alert, oriented to name, place and time.  Cranial nerves II-XII are grossly intact.  Motor is  intact.  Sensory is intact.,  and gait is abnormal.  And unstable    Assessment:     Patient Active Problem List:     Type II or unspecified type diabetes mellitus without mention of complication, not stated as uncontrolled     Shortness of breath on exertion     Anemia of chronic disease     CAD (coronary artery disease)     Arthritis     Essential hypertension     PAF (paroxysmal atrial fibrillation) (H[facility])     Stage 4 chronic kidney disease (H[facility])     ARF (acute renal failure) (H[facility])     Traumatic seroma of left lower leg     Acute renal failure superimposed on stage 4 chronic kidney disease ([place])     Acute on chronic blood loss anemia ([place])      Plan:     1. Reviewed POC blood glucose . Labs and X ray results   2. Reviewed Current Medicines   3. On  Correction bolus Humalog Insulin regime    4. Monitor Blood glucose frequently   5. Modified  the dose of Insulin/ other medicines as needed   6. On his scheduled hemodialysis  7. Will follow     .     [name], MD

## 2018-03-03 NOTE — Progress Notes (Signed)
[facility] [address], [place] [address]  Phone: [phone]  Office Hours: 8:30AM - 4:30PM  Monday - Friday     Hb 7.7  No other labs  Will check BMP prior to dialysis

## 2018-03-03 NOTE — Progress Notes (Signed)
Bowel prep underway  Abdomen soft, non-tender, non-distended  Colonoscopy tomorrow 7 am

## 2018-03-03 NOTE — Plan of Care (Signed)
Problem: Infection:  Goal: Will remain free from infection  Description  Will remain free from infection  Outcome: Ongoing     Problem: Safety:  Goal: Free from accidental physical injury  Description  Free from accidental physical injury  Outcome: Ongoing  Goal: Free from intentional harm  Description  Free from intentional harm  Outcome: Ongoing     Problem: Daily Care:  Goal: Daily care needs are met  Description  Daily care needs are met  Outcome: Ongoing     Problem: Pain:  Goal: Patient's pain/discomfort is manageable  Description  Patient's pain/discomfort is manageable  Outcome: Ongoing  Goal: Pain level will decrease  Description  Pain level will decrease  Outcome: Ongoing  Goal: Control of acute pain  Description  Control of acute pain  Outcome: Ongoing  Goal: Control of chronic pain  Description  Control of chronic pain  Outcome: Ongoing     Problem: Discharge Planning:  Goal: Patients continuum of care needs are met  Description  Patients continuum of care needs are met  Outcome: Ongoing     Problem: Falls - Risk of:  Goal: Will remain free from falls  Description  Will remain free from falls  Outcome: Ongoing  Goal: Absence of physical injury  Description  Absence of physical injury  Outcome: Ongoing     Problem: Risk for Impaired Skin Integrity  Goal: Tissue integrity - skin and mucous membranes  Description  Structural intactness and normal physiological function of skin and  mucous membranes.  Outcome: Ongoing     Problem: Nutrition  Goal: Optimal nutrition therapy  Outcome: Ongoing

## 2018-03-03 NOTE — Progress Notes (Signed)
Nutrition Assessment    Type and Reason for Visit: Initial(los 7)    Nutrition Recommendations:    Continue Clear Liquid colonoscopy prep   Begin clear liquid oral nutrition supplement tid   Resume Renal Diet as timely as able   Renal oral supplement appropriate for pt post colonoscopy   Nutrition focused physical exam for malnutrition at reassess    Nutrition Assessment: High nutrition risk with multiple days NPO/Clear Liquids dos. Pt off unit for dialysis presently. Currently undergoing slow, 2 day bowel prep for colonoscopy on Clear Liquid Diet.    Malnutrition Assessment:   Malnutrition Status: At risk for malnutrition    Nutrition Risk Level: High    Nutrient Needs:   Estimated Daily Total Kcal: [phone] (25-30 kcal/kg)   Estimated Daily Protein (g): 38-76 (1-2 g/kg)   Estimated Daily Total Fluid (ml/day): [phone] (1 ml/kcal)    Nutrition Diagnosis:    Problem: Inadequate oral intake   Etiology: related to Insufficient energy/nutrient consumption    . Signs and symptoms:  as evidenced by Diet history of poor intake, Known losses from dialysis    Objective Information:   Wound Type: None   Current Nutrition Therapies:   Oral Diet Orders: Clear Liquid    Oral Diet intake: 1-25%, 76-100%   Oral Nutrition Supplement (ONS) Orders: None   Anthropometric Measures:   Ht: 4\' 7"  (139.7 cm)    Current Body Wt: 82 lb 14.3 oz (37.6 kg)   Admission Body Wt: 80 lb 11 oz (36.6 kg)   Usual Body Wt: 94 lb 3.2 oz (42.7 kg)(05/13/17)   % Weight Change: -11 in 10 months   Ideal Body Wt: 92 lb (41.7 kg), % Ideal Body 89   BMI Classification: BMI 18.5 - 24.9 Normal Weight(19.3)    Nutrition Interventions:   Continue current diet, Start ONS  Continued Inpatient Monitoring, Education not appropriate at this time, Coordination of Care    Nutrition Evaluation:    Evaluation: Goals set    Goals:  Pt will tolerate diet advancement within 24-48 hrs     Monitoring: Nutrition Progression, Meal Intake, Supplement  Intake, Diet Tolerance, Weight, Pertinent Labs      Electronically signed by Helane Gunther, RD, LD on 03/03/18 at 11:39 AM    Contact Number: [phone]

## 2018-03-03 NOTE — Progress Notes (Signed)
Hospitalist Progress Note      Name:  Judy Velasquez DOB/Age/Sex: 1929-05-14  (84 y.o. female)   MRN & CSN:  [id number] & [id number] Admission Date/Time: 02/24/2018  9:52 AM   Location:  3001/3001-A PCP: [name], MD         Hospital Day: 8    Assessment and Plan:   Judy Velasquez is a 84 y.o.  female  who presents with Acute renal failure superimposed on stage 4 chronic kidney disease ([place])      Colonoscopy in the AM     [place] with [place]     Acute on chronic anemia secondary to blood loss  Likely UGIB  Hgb 6.8  --> 7.7 s/p 2U during HD  H/H Q8h  Occult blood +Ve  GI consult--> EGD negative. Dr Edwena Felty to decide on C Scopy  slow 2-day prep with colonoscopy Wednesday morning at 0700  Dc heparin      AKI on CKD stage 4  Tunneled cath placed --> HD  Op dialysis next week  Daily weights  I/O  Nephrology on board  Per CM: Pt has been approved for dialysis at [facility] in [place].  ??  DM   Cont sliding scale insulin  CB monitoring  ??  CAD  PAF  hyperlipidemia  hypertension  Cont home meds  Cardio consult  ??    Chronic treatment continued per home medications unless  contraindicated by above plan and assessment.    The above assessment/plan has been explained to the patient, who indicated understanding.      Diet DIET CLEAR LIQUID; No Added Salt (3-4 GM)  Diet NPO Time Specified  Dietary Nutrition Supplements: Clear Liquid Oral Supplement   DVT Prophylaxis []  Lovenox, []   Heparin, [x]  SCDs, [] No VTE prophylaxis, patient ambulating   GI Prophylaxis []  PPI, [x]  H2 Blocker, []  No GI prophylaxis, patient is receiving diet/Tube Feeds   Code Status Full Code             History of Present Illness:     Chief Complaint: Acute renal failure superimposed on stage 4 chronic kidney disease ([place])    Patient was seen and examined at bedside today. Patient sitting up comfortably in bed in NAD.   Ten point ROS reviewed negative, unless as noted above    Objective:       Intake/Output Summary (Last 24 hours) at 03/03/2018 1406  Last data  filed at 03/03/2018 1236  Gross per 24 hour   Intake 560 ml   Output 1506 ml   Net -946 ml      Vitals:   Vitals:    03/03/18 1245   BP:    Pulse:    Resp:    Temp: 97.9 ??F (36.6 ??C)   SpO2:      Physical Exam:      GEN Awake female, sitting upright in bed in no apparent distress. Appears given age.  EYES Pupils are equally round.  No scleral erythema, discharge, or conjunctivitis.  HENT Mucous membranes are moist.   RESP Clear to auscultation, no wheezes, rales or rhonchi.    CARDIO/VASC S1/S2 auscultated. No murmurs  GI Abdomen is soft without significant tenderness, masses, or guarding.   MSK No gross joint deformities. Spontaneous movement of all extremities  SKIN Normal coloration, warm, dry.  NEURO Grossly normal  PSYCH Awake, alert, oriented     Medications:   Medications:   ??? bisacodyl  10 mg Oral Once   ???  polyethylene glycol  17 g Oral Q8H   ??? famotidine (PEPCID) injection  20 mg Intravenous Daily   ??? polyethylene glycol  4,000 mL Oral Once   ??? sodium chloride  250 mL Intravenous Once   ??? darbepoetin alfa-polysorbate  60 mcg Subcutaneous Weekly - Thursday   ??? insulin lispro  0-6 Units Subcutaneous TID WC   ??? insulin lispro  0-3 Units Subcutaneous 2 times per day   ??? 0.9 % sodium chloride  250 mL Intravenous Once   ??? sodium chloride flush  10 mL Intravenous 2 times per day   ??? carvedilol  3.125 mg Oral BID   ??? docusate sodium  100 mg Oral BID   ??? fluticasone  1 spray Nasal Daily   ??? furosemide  40 mg Oral BID   ??? NIFEdipine  30 mg Oral BID   ??? b complex-C-E-zinc  1 tablet Oral Daily      Infusions:   ??? dextrose       PRN Meds:     polyvinyl alcohol 1 drop PRN   acetaminophen 650 mg Q4H PRN   oxyCODONE 2.5 mg Q4H PRN   sodium chloride flush 10 mL PRN   ondansetron 4 mg Q6H PRN   glucose 15 g PRN   dextrose 12.5 g PRN   glucagon (rDNA) 1 mg PRN   dextrose 100 mL/hr PRN         Electronically signed by Bailey Mech, MD on 03/03/2018 at 2:06 PM

## 2018-03-03 NOTE — Plan of Care (Signed)
Nutrition Problem: Inadequate oral intake  Intervention: Food and/or Nutrient Delivery: Continue current diet, Start ONS  Nutritional Goals: Pt will tolerate diet advancement within 24-48 hrs

## 2018-03-03 NOTE — Care Coordination-Inpatient (Addendum)
CM in and spoke with pt for additional dc planning. PT/OT rec SNF yesterday during their evals and this was explained to pt. Pt does agree she is not as strong as normal and is open to SNF or returning home with her granddaughter/[name] who does not work outside of the home. Natasha RN in room and states pt has been able to manage in the room with her walker well.   Pt provided permission for CM to call granddaughter for additional dc planning as she is not aware of which SNF she would want or which option her granddaughter prefers.       10:17 AM  Called and left message for pt granddaughter/[name] [phone] for additional dc planning, awaiting return call.       10:59 AM  Received call from pt granddaughter/[name] who states pt prefers to be at home and she also prefers this as pt is able to ambulate with the walker. States pt has [facility] already and would like this to be resumed at discharge.         DC plan is return home with granddaughter and support of [facility]. Pt will have new HD at [facility] M/W/F with 5:45 am arrival for 6 am chair time.     Pt is active with [facility] At dc, please call to notify at [phone]. Please fax AVS, facesheet, and HHC order to [phone].

## 2018-03-03 NOTE — Progress Notes (Signed)
[facility] [address] [place] [address]  Phone: [phone]  Office Hours: 8:30AM - 4:30PM  Monday - Friday      Nephrology  Dialysis Note        PROCEDURE:  Patient seen  for hemodialysis      PHYSICIAN:  PK      INDICATION:  Acute renal failure      RX:  See dialysis flowsheet for specifics on access, blood flow rate, dialysate baths, duration of dialysis, anticoagulation and other technical information.      COMMENTS:  Hb 7.7  Creat 3 predialysis

## 2018-03-03 NOTE — Anesthesia Pre-Procedure Evaluation (Addendum)
Department of Anesthesiology  Preprocedure Note       Name:  Judy Velasquez   Age:  84 y.o.  DOB:  1929/02/08                                          MRN:  [id number]         Date:  03/03/2018      Surgeon: Juliann Mule):  [name], MD    Procedure: COLONOSCOPY DIAGNOSTIC (N/A )    Medications prior to admission:   Prior to Admission medications    Medication Sig Start Date End Date Taking? Authorizing Provider   iron polysaccharides (FERREX 150) 150 MG capsule Take 150 mg by mouth 12/22/17  Yes Historical Provider, MD   acetaminophen (TYLENOL) 500 MG tablet Take 1,000 mg by mouth    Historical Provider, MD   metolazone (ZAROXOLYN) 2.5 MG tablet  02/02/18   Historical Provider, MD   ondansetron (ZOFRAN-ODT) 8 MG TBDP disintegrating tablet  01/26/18   Historical Provider, MD   polyethylene glycol (GLYCOLAX) packet Take 17 g by mouth    Historical Provider, MD   HYDROcodone-acetaminophen (NORCO) 5-325 MG per tablet Take 1 tablet by mouth every 6 hours as needed for Pain.    Historical Provider, MD   furosemide (LASIX) 40 MG tablet Take 40 mg by mouth 2 times daily    Historical Provider, MD   isosorbide dinitrate (ISORDIL) 30 MG tablet Take 1 tablet by mouth 3 times daily 09/26/17   Vernie Ammons, MD   hydrALAZINE (APRESOLINE) 100 MG tablet Take 1 tablet by mouth 3 times daily 09/26/17   Vernie Ammons, MD   carvedilol (COREG) 3.125 MG tablet Take 1 tablet by mouth 2 times daily 09/26/17   Vernie Ammons, MD   NIFEdipine (ADALAT CC) 30 MG extended release tablet Take 1 tablet by mouth 2 times daily 09/26/17   Vernie Ammons, MD   docusate sodium (PHILLIPS STOOL SOFTENER) 100 MG capsule Take 1 capsule by mouth 2 times daily 09/26/17   Vernie Ammons, MD   B Complex-Biotin-FA (SUPER B-50 B COMPLEX PO) Take by mouth    Historical Provider, MD   fluticasone (FLONASE) 50 MCG/ACT nasal spray 1 spray by Nasal route daily    Historical Provider, MD       Current medications:    Current Facility-Administered Medications   Medication Dose Route  Frequency Provider Last Rate Last Dose   . pill splitter            . polyethylene glycol (GLYCOLAX) packet 17 g  17 g Oral Q8H [name], MD   17 g at 03/03/18 1505   . polyvinyl alcohol (LIQUIFILM TEARS) 1.4 % ophthalmic solution 1 drop  1 drop Both Eyes PRN Bailey Mech, MD   1 drop at 03/03/18 0050   . famotidine (PEPCID) injection 20 mg  20 mg Intravenous Daily Bailey Mech, MD   Stopped at 03/03/18 0901   . polyethylene glycol (GoLYTELY) solution 4,000 mL  4,000 mL Oral Once [name], MD       . 0.9 % sodium chloride bolus  250 mL Intravenous Once Lester Kinsman, MD       . acetaminophen (TYLENOL) tablet 650 mg  650 mg Oral Q4H PRN Renard Matter, MD   650 mg at 03/02/18 2055   . oxyCODONE (ROXICODONE) immediate release tablet 2.5 mg  2.5 mg Oral Q4H PRN Renard Matter, MD   2.5 mg at 03/03/18 1504   . darbepoetin alfa-polysorbate (ARANESP) injection 60 mcg  60 mcg Subcutaneous Weekly - Thursday Claretta Fraise, MD   60 mcg at 02/26/18 1507   . insulin lispro (HUMALOG) injection vial 0-6 Units  0-6 Units Subcutaneous TID WC [name], MD   1 Units at 03/03/18 1653   . insulin lispro (HUMALOG) injection vial 0-3 Units  0-3 Units Subcutaneous 2 times per day [name], MD   1 Units at 03/01/18 2355   . 0.9 % sodium chloride infusion 250 mL  250 mL Intravenous Once Claretta Fraise, MD       . sodium chloride flush 0.9 % injection 10 mL  10 mL Intravenous 2 times per day Clydie Braun, APRN - CNP   10 mL at 03/02/18 2056   . sodium chloride flush 0.9 % injection 10 mL  10 mL Intravenous PRN Clydie Braun, APRN - CNP       . ondansetron (ZOFRAN) injection 4 mg  4 mg Intravenous Q6H PRN Clydie Braun, APRN - CNP   4 mg at 03/03/18 1735   . carvedilol (COREG) tablet 3.125 mg  3.125 mg Oral BID Clydie Braun, APRN - CNP   Stopped at 03/03/18 0901   . docusate sodium (COLACE) capsule 100 mg  100 mg Oral BID Clydie Braun, APRN - CNP   Stopped at 03/03/18 0901   . fluticasone (FLONASE) 50 MCG/ACT nasal spray  1 spray  1 spray Nasal Daily Clydie Braun, APRN - CNP   1 spray at 03/02/18 0903   . furosemide (LASIX) tablet 40 mg  40 mg Oral BID Clydie Braun, APRN - CNP   40 mg at 03/03/18 1653   . NIFEdipine (PROCARDIA XL) extended release tablet 30 mg  30 mg Oral BID Clydie Braun, APRN - CNP   Stopped at 03/03/18 0902   . b complex-C-E-zinc (STRESS FORMULA W/ ZINC) 1 tablet  1 tablet Oral Daily Clydie Braun, APRN - CNP   1 tablet at 03/03/18 1504   . glucose (GLUTOSE) 40 % oral gel 15 g  15 g Oral PRN Clydie Braun, APRN - CNP       . dextrose 50 % IV solution  12.5 g Intravenous PRN Clydie Braun, APRN - CNP       . glucagon (rDNA) injection 1 mg  1 mg Intramuscular PRN Clydie Braun, APRN - CNP       . dextrose 5 % solution  100 mL/hr Intravenous PRN Clydie Braun, APRN - CNP           Allergies:    Allergies   Allergen Reactions   . Celebrex [Celecoxib] Shortness Of Breath, Swelling and Anaphylaxis     "Swelling Of The Esophagus"   . Fish Allergy Anaphylaxis   . Lisinopril Other (See Comments)     Dry Mouth   . Neurontin [Gabapentin] Other (See Comments)     Dizziness   . Norvasc [Amlodipine Besylate] Other (See Comments)     Edema Of Legs   . Other      "Allergic To Certain Seafoods Causing Headaches, Difficulty Breathing"   . Shellfish Allergy Anaphylaxis   . Ciprofloxacin Rash   . Lipitor Other (See Comments)     Ache   . Losartan      Per nephrologist       Problem List:    Patient Active Problem List   Diagnosis  Code   . Type II or unspecified type diabetes mellitus without mention of complication, not stated as uncontrolled E11.9   . Shortness of breath on exertion R06.02   . Anemia of chronic disease D63.8   . Kidney problem N28.9   . CAD (coronary artery disease) I25.10   . Arthritis M19.90   . Chest pain R07.9   . Essential hypertension I10   . PAF (paroxysmal atrial fibrillation) (HCC) I48.0   . Stage 4 chronic kidney disease ([place]) N18.4   . ARF (acute renal failure) (HCC) N17.9   . AKI (acute kidney  injury) ([place]) N17.9   . Traumatic seroma of left lower leg (HCC) T79.2XXA   . Acute renal failure superimposed on stage 4 chronic kidney disease (HCC) N17.9, N18.4   . Uncontrolled type 2 diabetes mellitus ([place]) E11.65   . Acute on chronic blood loss anemia D62   . Anemia due to chronic kidney disease N18.9, D63.1       Past Medical History:        Diagnosis Date   . Allergic rhinitis    . Anemia    . Anesthesia     Nausea/Vomiting Post Op In Past   . Anxiety    . Arthritis     s/p bilateral hip and right knee replacement sees Dr. Chriss Czar   . Back problem     "Occ Back Hurts"   . Blood transfusion    . CAD (coronary artery disease) 2008    Sees Dr. Brandt Loosen CABG x 3   . DCIS (ductal carcinoma in situ) 05/20/2011   . Glaucoma     Bilateral Eyes   . H/O cardiovascular stress test 05/20/11   . Heart attack ([place]) 2006   . HOH (hard of hearing)     Bilateral Ears   . Hyperlipidemia    . Hypertension    . IBS (irritable bowel syndrome)    . Kidney problem     "Sees  Dr. Maretta Bees For Blood Pressure And Kidneys"   . Nausea & vomiting     Nausea/Vomiting Post Op In Past   . Osteoarthritis    . Osteoporosis    . Right Breast Cancer Dx 7-12   . Shortness of breath on exertion    . Staph infection 1980's    "They sent me to a tropical infection doctor"   . TB (tuberculosis)     "Probably had it as a child, my dad had it"   . Type II or unspecified type diabetes mellitus without mention of complication, not stated as uncontrolled Dx 1980's       Past Surgical History:        Procedure Laterality Date   . BREAST LUMPECTOMY  05/20/2011    Right   . BREAST SURGERY  7-12    Right Breast Biopsy   . CARDIAC SURGERY  2008    CABG (3 Bypasses)   . CARPAL TUNNEL RELEASE  2000's    Right   . CESAREAN SECTION  "Late 1950's or Early 1960's"    X 3   . CHOLECYSTECTOMY, LAPAROSCOPIC  1990's   . COLONOSCOPY  2000's   . CORONARY ARTERY BYPASS GRAFT  2008    cabg x 3   . ENDOSCOPY, COLON, DIAGNOSTIC  1990's   . HYSTERECTOMY, TOTAL ABDOMINAL  1960's    . JOINT REPLACEMENT  1980's    Total Right Hip   . JOINT REPLACEMENT  2000's    Total Right  Hip   . JOINT REPLACEMENT  1990's    Total Left Hip   . JOINT REPLACEMENT  1980's    Total Right Knee   . OTHER SURGICAL HISTORY      Family Physician Is Dr. Evelina Bucy In [place], [place]   . OTHER SURGICAL HISTORY  2000's    "Took section of my intestines out, it was a exploratory operation, they found scar tissue from the c-sections"   . TONSILLECTOMY AND ADENOIDECTOMY  1939   . UPPER GASTROINTESTINAL ENDOSCOPY N/A 09/18/2017    EGD BIOPSY performed by [name], MD at [facility]   . UPPER GASTROINTESTINAL ENDOSCOPY N/A 02/28/2018    EGD DIAGNOSTIC ONLY performed by Barbette Merino, MD at [facility] History:    Social History     Tobacco Use   . Smoking status: Never Smoker   . Smokeless tobacco: Never Used   Substance Use Topics   . Alcohol use: No                                Counseling given: Not Answered      Vital Signs (Current):   Vitals:    03/03/18 1215 03/03/18 1236 03/03/18 1245 03/03/18 1422   BP: (!) 115/44 (!) 142/46  (!) 110/43   Pulse: 60   66   Resp: 15   19   Temp:   36.6 C (97.9 F) 36.7 C (98 F)   TempSrc:    Oral   SpO2: 100%   100%   Weight:       Height:                                                  BP Readings from Last 3 Encounters:   03/03/18 (!) 110/43   02/28/18 (!) 142/54   01/13/18 (!) 155/55       NPO Status:                                                                                 BMI:   Wt Readings from Last 3 Encounters:   03/03/18 82 lb 14.3 oz (37.6 kg)   01/13/18 95 lb (43.1 kg)   12/02/17 107 lb (48.5 kg)     Body mass index is 19.27 kg/m.    CBC:   Lab Results   Component Value Date    WBC 6.8 02/27/2018    RBC 2.04 02/27/2018    HGB 8.6 03/03/2018    HCT 27.4 03/03/2018    MCV 97.1 02/27/2018    RDW 19.1 02/27/2018    PLT 141 02/27/2018       CMP:   Lab Results   Component Value Date    NA 133 03/03/2018    K 4.4 03/03/2018    CL 101 03/03/2018     CO2 19 03/03/2018    BUN 69 03/03/2018    CREATININE 3.0 03/03/2018  GFRAA 18 03/03/2018    LABGLOM 15 03/03/2018    GLUCOSE 135 03/03/2018    PROT 5.0 02/25/2018    PROT 6.5 04/20/2010    CALCIUM 7.7 03/03/2018    BILITOT 0.9 02/25/2018    ALKPHOS 48 02/25/2018    AST 13 02/25/2018    ALT <5 02/25/2018       POC Tests:   Recent Labs     03/03/18  1608   POCGLU 158*       Coags:   Lab Results   Component Value Date    PROTIME 10.7 02/24/2018    PROTIME 11.8 10/23/2009    INR 0.94 02/24/2018    APTT 43.4 02/24/2018       HCG (If Applicable): No results found for: PREGTESTUR, PREGSERUM, HCG, HCGQUANT     ABGs: No results found for: PHART, PO2ART, PCO2ART, HCO3ART, BEART, O2SATART     Type & Screen (If Applicable):  No results found for: LABABO, [place]    Anesthesia Evaluation     history of anesthetic complications: PONV.  Airway: Mallampati: II  TM distance: >3 FB   Neck ROM: full  Mouth opening: > = 3 FB Dental:          Pulmonary: breath sounds clear to auscultation  (+) shortness of breath: chronic,                             Cardiovascular:  Exercise tolerance: poor (<4 METS),   (+) hypertension:, past MI: no interval change, CAD: obstructive, CABG/stent: no interval change, dysrhythmias: atrial fibrillation, CHF:, DOE: after ambulating 1 flight of stairs, hyperlipidemia      ECG reviewed  Rhythm: regular  Rate: normal  Echocardiogram reviewed         Beta Blocker:  Dose within 24 Hrs      ROS comment: Summary  This is a limited echo to assess ejection fraction and RVSP.  Left ventricular systolic function is normal.  Ejection fraction is visually estimated at >50%.  Moderate left ventricular hypertrophy.  Right ventricular systolic pressure of 47 mm Hg consistent with moderate  pulmonary hypertension.  Mild aortic insufficiency seen.  Mitral annular calcification is present.  Mild mitral regurgitation is present.  Mild tricuspid regurgitation.  No evidence of pericardial effusion     Neuro/Psych:    Negative Neuro/Psych ROS  (+) depression/anxiety             GI/Hepatic/Renal:   (+) renal disease: ARF and CRI,           Endo/Other:    (+) DiabetesType II DM, , blood dyscrasia: anemia, arthritis: OA., malignancy/cancer (breast).                 Abdominal:           Vascular: negative vascular ROS.                                   Anesthesia Plan      TIVA, general and MAC     ASA 3       Induction: intravenous.      Anesthetic plan and risks discussed with patient.                    Ashok Cordia, APRN - CRNA   03/03/2018    Pre Anesthesia Evaluation complete. Anesthesia plan,  risks, benefits, alternatives, and personnel discussed with patient and/or legal guardian. Patient and/or legal guardian verbalized an understanding and agreed to proceed. Anesthesia plan discussed with care team members and agreed upon.  Humphrey Rolls, APRN - CRNA  03/04/2018

## 2018-03-03 NOTE — Progress Notes (Signed)
Pt tolerated 2.5 hr HD treatment well, removing 1 L fluids. Epogen given for anemia. No complaints, VSS. Both ports heparin locked and capped per policy. Report given to RN.        Patient Name: Judy Velasquez  Patient DOB: [birth year]  MRN: [id number]     Acct: [id number]  Date of Admission: 02/24/2018  Room/Bed: 3001/3001-A  Code Status:  Full Code  Allergies:   Allergies   Allergen Reactions   ??? Celebrex [Celecoxib] Shortness Of Breath, Swelling and Anaphylaxis     "Swelling Of The Esophagus"   ??? Fish Allergy Anaphylaxis   ??? Lisinopril Other (See Comments)     Dry Mouth   ??? Neurontin [Gabapentin] Other (See Comments)     Dizziness   ??? Norvasc [Amlodipine Besylate] Other (See Comments)     Edema Of Legs   ??? Other      "Allergic To Certain Seafoods Causing Headaches, Difficulty Breathing"   ??? Shellfish Allergy Anaphylaxis   ??? Ciprofloxacin Rash   ??? Lipitor Other (See Comments)     Ache   ??? Losartan      Per nephrologist     Diagnosis:    Patient Active Problem List   Diagnosis   ??? Type II or unspecified type diabetes mellitus without mention of complication, not stated as uncontrolled   ??? Shortness of breath on exertion   ??? Anemia of chronic disease   ??? Kidney problem   ??? CAD (coronary artery disease)   ??? Arthritis   ??? Chest pain   ??? Essential hypertension   ??? PAF (paroxysmal atrial fibrillation) ([place])   ??? Stage 4 chronic kidney disease ([place])   ??? ARF (acute renal failure) ([place])   ??? AKI (acute kidney injury) ([place])   ??? Traumatic seroma of left lower leg (HCC)   ??? Acute renal failure superimposed on stage 4 chronic kidney disease ([place])   ??? Uncontrolled type 2 diabetes mellitus ([place])   ??? Acute on chronic blood loss anemia   ??? Anemia due to chronic kidney disease         Treatment:  Hemodilaysis 2:1  Routine  Location: Acute Room    Diabetic: Yes  NPO: No  Isolation Precautions: Dialysis     Consent for Treatment Verified: Yes  Blood Consent Verified: Not Applicable     Safety Verified: Identify (I), Consent (C), Equipment  (E), HepB Status (B), Orders Complete (O), Access Verified (A) and Timeliness (T)  Time out performed prior to access at 0952.     Report Received from Primary RN: Yes Time: 2595  GLOVFIE RN (First Initial, Last Name, Title): Veverly Fells RN  Incapacitated Nurse Education Completed: Not Applicable     HBsAg ONLY:  Date Drawn: February 24, 2018       Results: Negative  HBsAb:  Date Drawn:  February 24, 2018       Results: Susceptible <10    Order     Citrate Bath  Ca+ (Calcium): (3.5) (03/03/18 1002)  K+ (Potassium): 4 (03/03/18 1002)  Bicarb: 30 (03/03/18 1002)  Na+ Modeling: Not Applicable  Dialyzer: P329  Dialysate Temperature (C):  36  Blood Flow Rate (BFR):  250   Dialysate Flow Rate (DFR):   700        Treatment  Treatment Number: 2  Time On: 5188  Time Off: 4166  Treatment Goal: 1 L   Weight: 82 lb 14.3 oz (37.6 kg) (03/03/18 0951)     Access to be  Utilized   Access: Tunneled Catheter  Location: Internal Jugular  Side: Right   Needle gauge:  Not Applicable  + Bruit/Thrill: Not Applicable    First Use X-ray Verified: n/a  OK to use line order: n/a    Site Assessment:  Signs and Symptoms of Infection/Inflammation: None  If yes: Not Applicable  Dressing: Dry and Intact  Site Prep: Medical Aseptic Technique  Dressing Changed this Treatment: Yes  If yes, by whom: [facility] RN  Date of Last Dressing Change: Not Applicable  Antimicrobial Patch in place?: Yes  Red Alcohol Caps in place?: n/a  Gauze Dressing?: No  Non Dialysis Use?: No  Comment:    Flows: Good, Patent  If access problem, who was notified:     Pre and Post-Assessment  Patient Vitals for the past 8 hrs:   Level of Consciousness Oriented X Heart Rhythm Resp Respiratory Quality/Effort O2 Device Bilateral Breath Sounds Skin Color Skin Condition/Temp Abdomen Inspection Bowel Sounds (All Quadrants) Edema Comments Pain Level   03/03/18 0846 0 -- -- 17 -- -- -- -- -- -- -- -- -- 4   03/03/18 0850 0 -- -- -- Unlabored -- -- Ecchymosis Warm;Dry Soft -- None -- --   03/03/18  0951 0 3 Regular 15 Unlabored None (Room air) Diminished Appropriate for ethnicity Warm;Dry Soft Active None consent verified, pre treatment vitals 0   03/03/18 1015 -- -- -- 14 -- -- -- -- -- -- -- -- -- --   03/03/18 1030 -- -- -- 16 -- -- -- -- -- -- -- -- -- --   03/03/18 1031 -- -- -- 15 -- -- -- -- -- -- -- -- -- --   03/03/18 1045 -- -- -- 12 -- -- -- -- -- -- -- -- -- --   03/03/18 1046 -- -- -- 12 -- -- -- -- -- -- -- -- -- --   03/03/18 1100 -- -- -- 20 -- -- -- -- -- -- -- -- -- --   03/03/18 1115 -- -- -- 14 -- -- -- -- -- -- -- -- -- --   03/03/18 1130 -- -- -- 14 -- -- -- -- -- -- -- -- -- --   03/03/18 1145 -- -- -- 15 -- -- -- -- -- -- -- -- -- --   03/03/18 1200 -- -- -- 20 -- -- -- -- -- -- -- -- -- --   03/03/18 1215 -- -- -- 15 -- -- -- -- -- -- -- -- -- --   03/03/18 1245 0 3 Regular -- Unlabored None (Room air) Diminished Appropriate for ethnicity Dry;Warm Soft Active None -- 0     Labs  Recent Labs     03/02/18  1141 03/02/18  2326 03/03/18  0629   HGB 8.5* 7.9* 7.7*   HCT 27.3* 25.3* 24.3*                                                                  Recent Labs     03/02/18  0344 03/03/18  0629 03/03/18  1002   NA 138 134* 133*   K 4.4 4.4 4.4   CL 104 102 101   CO2 21 19* 19*   BUN 59* 68* 69*  CREATININE 2.7* 3.0* 3.0*   GLUCOSE 99 94 135*     IV Drips and Rate/Dose  ??? dextrose        Safety - Before each treatment:   Dialysis Machine No.: [id number]  RO Machine No.: [id number]  Dialyzer Lot No.: [id number]  RO Machine Log Sheet Completed: Yes  Machine Alarm Self Test: Completed;Passed (03/03/18 0951)  Machine Autotest: Completed, Games developer: Tested, Proper Function, pH Reading  Extracorporeal Circuit Tested for Integrity: Yes  Machine Conductivity: 14.3  Manual Conductivity: 14  Machine Ph: 7.5  Bicarbonate Concentrate Lot No.: [id number]  Acid Concentrate Lot No.: [id number]  Manual Ph: 7.5  Bleach Test (Neg): Yes  Bath Temperature: 96.8 ??F (36 ??C)  Tubing Lot#:  [id number]  Conductivity Meter Serial #: [id number]  All Connections Secure?: Yes  Venous Parameters Set?: Yes  Arterial Parameters Set?: Yes  Saline Line Double Clamped?: Yes  Air Foam Detector Engaged?: Yes  Machine Functioning Alarm Free? Yes  Prime Given: 267ml    Chlorine Testing - Before each treatment and every 4 hours:   1st check: less than 0.1 ppm at (time): 071  2nd check: less than 0.1 ppm at (time): 1025  3rd check: Not Applicable  (if greater than 0.1 ppm, then check every 30 minutes from secondary)     Vital Signs  Patient Vitals for the past 6 hrs:   BP Pulse Blood Flow Rate (ml/min) Ultrafiltration Rate (ml/hr) Ultrafiltration Total Temp Arterial Pressure (mmHg) Venous Pressure (mmHg) TMP DFR Comments Access Visible   03/03/18 0846 (!) 131/44 64 -- -- -- 97.6 ??F (36.4 ??C) -- -- -- -- -- --   03/03/18 0951 (!) 144/49 70 -- -- -- 98.4 ??F (36.9 ??C) -- -- -- -- -- --   03/03/18 1002 (!) 137/41 58 200 ml/min 600 ml/hr 0 ml -- -40 mmHg 50 50 700 treatment initiated, md notified Yes   03/03/18 1015 (!) 124/44 60 250 ml/min 600 ml/hr 115 ml -- -70 mmHg 60 50 700 watching tv Yes   03/03/18 1030 -- 61 -- -- -- -- -- -- -- -- -- --   03/03/18 1031 (!) 116/42 61 250 ml/min 600 ml/hr 299 ml -- -90 mmHg 60 50 700 requested warm blanket Yes   03/03/18 1045 (!) 115/40 59 -- -- -- -- -- -- -- -- -- --   03/03/18 1046 (!) 115/40 58 250 ml/min 600 ml/hr 419 ml -- -110 mmHg 70 50 700 watching tv Yes   03/03/18 1100 (!) 121/43 67 250 ml/min 600 ml/hr 561 ml -- -130 mmHg 60 60 700 resting Yes   03/03/18 1115 (!) 113/43 62 250 ml/min 600 ml/hr 731 ml -- -110 mmHg 70 50 700 no distress Yes   03/03/18 1130 (!) 106/40 62 250 ml/min 600 ml/hr 873 ml -- -110 mmHg 70 50 700 lines intact Yes   03/03/18 1145 (!) 126/46 69 250 ml/min 600 ml/hr 1009 ml -- -170 mmHg 60 60 700 vss Yes   03/03/18 1200 (!) 128/56 67 250 ml/min 600 ml/hr 1160 ml -- -110 mmHg 70 50 700 resting quietly Yes   03/03/18 1215 (!) 115/44 60 250 ml/min 600 ml/hr 1208  ml -- -110 mmHg 70 50 700 denies needs Yes   03/03/18 1230 -- -- 250 ml/min 600 ml/hr 1450 ml -- -120 mmHg 60 50 700 no complaints Yes   03/03/18 1236 (!) 142/46 -- -- -- 1506 ml -- -- -- -- -- treatment terminated Yes  03/03/18 1245 -- -- -- -- -- 97.9 ??F (36.6 ??C) -- -- -- -- -- --     Post-Dialysis  Arterial Catheter Locking Solution: Heparin (1000units:22ml)    Volume (ml): 1.7  Venous Catheter Locking Solution: Heparin (1000units:22ml)    Volume (ml): 1.8  Post-Treatment Procedures: Blood returned, Catheter capped, clamped with Citrate x 2 ports  Machine Disinfection Process: Financial controller  Rinseback Volume (ml): 300 ml  Total Liters Processed (l/min): 35 l/min  Dialyzer Clearance: Lightly streaked  Duration of Treatment (minutes): 150 minutes  Heparin amount administered during treatment (units): 0 units  Hemodialysis Intake (ml): 500 ml  Hemodialysis Output (ml): 1506 ml  NET Removed (ml): 1006 ml  Tolerated Treatment: Good  Patient Response to Treatment: no complaints  Physician Notified?: No       Provider Notification        Handoff complete and report given to Primary RN?: Yes Time:  1517   OHYWVPX RN (First Initial, Last Name, Title):  Delane Ginger Lias RN     Education  Person Educated: Patient   Knowledge Base: Minimal  Barriers to Learning?: None  Preferred method of Learning: Oral  Topic(s): Access Care, Signs and Symptoms of Infection, Procedural and Medications   Teaching Tools: Video   Response to Education: Verbalized Understanding     Electronically signed by Valentina Lucks, RN on 03/03/2018 at 12:58 PM

## 2018-03-03 NOTE — Progress Notes (Addendum)
Progress Note( Dr. Loel Dubonnet)    Subjective:   Admit Date: 02/24/2018  PCP: [name], MD    Admitted For : Severe anemia with hemoglobin meal was 3+    Consulted For: Better control of blood glucose    Interval History: Patient is much better more alert and awake have normal communications  Going for colonoscopy later in the day    Denies any chest pains,   Mild to moderate SOB .   Denies nausea or vomiting.   No new bowel or bladder symptoms.       Intake/Output Summary (Last 24 hours) at 03/05/2018 2334  Last data filed at 03/05/2018 1240  Gross per 24 hour   Intake 480 ml   Output --   Net 480 ml       DATA    CBC:   Recent Labs     03/05/18  0032 03/05/18  1018 03/05/18  1804   HGB 7.4* 8.0* 7.2*    CMP:  Recent Labs     03/03/18  1002 03/04/18  0941 03/05/18  0344   NA 133* 135 139   K 4.4 4.8 4.4   CL 101 103 104   CO2 19* 21 22   BUN 69* 35* 39*   CREATININE 3.0* 2.0* 2.5*   CALCIUM 7.7* 8.1* 8.0*     Lipids: No results found for: CHOL, HDL, TRIG  Glucose:  Recent Labs     03/05/18  1124 03/05/18  1618 03/05/18  2226   POCGLU 163* 176* 233*     HemoglobinA1C:  Lab Results   Component Value Date    LABA1C 5.0 02/25/2018     High Sensitivity TSH: No results found for: TSHHS  Free T3: No results found for: FT3  Free T4:No results found for: T4FREE    Xr Chest Portable    Result Date: 02/24/2018  EXAMINATION: ONE XRAY VIEW OF THE CHEST 02/24/2018 2:37 pm COMPARISON: Chest x-ray August 18, 2017 HISTORY: ORDERING SYSTEM PROVIDED HISTORY: post cvc      1. Right IJ central venous catheter tip at the level of the cavoatrial junction.  No pneumothorax identified.       Scheduled Medicines   Medications:   ??? [START ON 03/06/2018] famotidine  20 mg Oral Daily   ??? sodium chloride  250 mL Intravenous Once   ??? darbepoetin alfa-polysorbate  60 mcg Subcutaneous Weekly - Thursday   ??? insulin lispro  0-6 Units Subcutaneous TID WC   ??? insulin lispro  0-3 Units Subcutaneous 2 times per day   ??? 0.9 % sodium chloride  250 mL  Intravenous Once   ??? sodium chloride flush  10 mL Intravenous 2 times per day   ??? carvedilol  3.125 mg Oral BID   ??? docusate sodium  100 mg Oral BID   ??? fluticasone  1 spray Nasal Daily   ??? furosemide  40 mg Oral BID   ??? NIFEdipine  30 mg Oral BID   ??? b complex-C-E-zinc  1 tablet Oral Daily      Infusions:   ??? dextrose           Objective:   Vitals: BP (!) 116/40    Pulse 57    Temp 98 ??F (36.7 ??C) (Oral)    Resp 14    Ht 4\' 7"  (1.397 Cailie Bosshart)    Wt 80 lb 11 oz (36.6 kg)    SpO2 100%    BMI 18.75 kg/Ariana Juul??  General appearance: alert and cooperative with exam  Neck: no JVD or bruit  Thyroid : Normal lobes   Lungs: Has Vesicular Breath sounds   Heart:  regular rate and rhythm  Abdomen: soft, non-tender; bowel sounds normal; no masses,  no organomegaly  Musculoskeletal: Normal  Extremities: extremities normal, , no edema  Neurologic:  Awake, alert, oriented to name, place and time.  Cranial nerves II-XII are grossly intact.  Motor is  intact.  Sensory is intact.,  and gait is abnormal.  And unstable    Assessment:     Patient Active Problem List:     Type II or unspecified type diabetes mellitus without mention of complication, not stated as uncontrolled     Shortness of breath on exertion     Anemia of chronic disease     CAD (coronary artery disease)     Arthritis     Essential hypertension     PAF (paroxysmal atrial fibrillation) (H[facility])     Stage 4 chronic kidney disease (HCC)     ARF (acute renal failure) (HCC)     Traumatic seroma of left lower leg     Acute renal failure superimposed on stage 4 chronic kidney disease ([place])     Acute on chronic blood loss anemia ([place])      Plan:     1. Reviewed POC blood glucose . Labs and X ray results   2. Reviewed Current Medicines   3. On  Correction bolus Humalog Insulin regime    4. Monitor Blood glucose frequently   5. Modified  the dose of Insulin/ other medicines as needed   6. On his scheduled hemodialysis  7. Will follow     .     [name], MD

## 2018-03-03 NOTE — Progress Notes (Signed)
Occupational Therapy    Occupational Therapy Treatment Note  Name: Judy Velasquez MRN: [id number] DOB:   [birth year]   Date:  03/03/2018   Admission Date: 02/24/2018 Room:  3001/3001-A     Restrictions/Precautions:  Restrictions/Precautions  Restrictions/Precautions: Fall Risk     Communication with other providers:  Pt cleared for therapy by RN.    Subjective:  Patient states:  "I had three hours of dialysis earlier"  Pain: Location, Type, Intensity (0/10 to 10/10):  Denies    Objective:    Observation:  Pt presented seated in chair, report she is tired but agreeable to therapy at this time.    Treatment, including education:  Therapeutic Exercise:  Cues were given for technique, safety, recruitment, and rationale.  Cues were verbal and/or tactile.  -Pt completed BUE exercises in all shoulder/elbow planes using 2# dowel rod with mod A, 10 reps x2. Pt required multiple rest breaks. Pt also completed BLE exercises leg lifts, marches and ankle pumps 10 reps x2.    -Safety: Pt left in chair with chair alarm on and call light in reach.     Assessment / Impression:        Patient's tolerance of treatment:  Good   Adverse Reaction: None  Significant change in status and impact:  None  Barriers to improvement: Decreased strength and endurance.    Plan for Next Session:    ADLs.    Time in:  1415  Time out:  1440  Timed treatment minutes:  25  Total treatment time:  25    Electronically signed by:    Truddie Crumble, COTA/L  03/03/2018, 2:45 PM    Previously filed values:

## 2018-03-04 LAB — CRITICAL CARE PANEL
Anion Gap: 11 (ref 4–16)
BUN: 35 MG/DL — ABNORMAL HIGH (ref 6–23)
CO2: 21 MMOL/L (ref 21–32)
Calcium: 8.1 MG/DL — ABNORMAL LOW (ref 8.3–10.6)
Chloride: 103 mMol/L (ref 99–110)
Creatinine: 2 MG/DL — ABNORMAL HIGH (ref 0.6–1.1)
GFR African American: 29 mL/min/{1.73_m2} — ABNORMAL LOW (ref >60)
GFR Non-African American: 24 mL/min/{1.73_m2} — ABNORMAL LOW (ref >60)
Glucose: 96 MG/DL (ref 70–99)
Magnesium: 1.8 mg/dl (ref 1.8–2.4)
Phosphorus: 3.7 MG/DL (ref 2.5–4.9)
Potassium: 4.8 MMOL/L (ref 3.5–5.1)
Sodium: 135 MMOL/L (ref 135–145)

## 2018-03-04 LAB — POCT GLUCOSE
POC Glucose: 111 MG/DL — ABNORMAL HIGH (ref 70–99)
POC Glucose: 103 MG/DL — ABNORMAL HIGH (ref 70–99)
POC Glucose: 98 MG/DL (ref 70–99)
POC Glucose: 98 MG/DL (ref 70–99)
POC Glucose: 119 MG/DL — ABNORMAL HIGH (ref 70–99)

## 2018-03-04 LAB — HEMOGLOBIN AND HEMATOCRIT
Hematocrit: 25.5 % — ABNORMAL LOW (ref 37–47)
Hematocrit: 23.5 % — ABNORMAL LOW (ref 37–47)
Hematocrit: 25.1 % — ABNORMAL LOW (ref 37–47)
Hemoglobin: 7.9 GM/DL — ABNORMAL LOW (ref 12.5–16.0)
Hemoglobin: 7.3 GM/DL — ABNORMAL LOW (ref 12.5–16.0)
Hemoglobin: 7.4 GM/DL — ABNORMAL LOW (ref 12.5–16.0)

## 2018-03-04 LAB — CEA: CEA: 8.4 NG/ML

## 2018-03-04 MED ORDER — PEG 3350-KCL-NABCB-NACL-NASULF 236 G PO SOLR
236 g | Freq: Once | ORAL | Status: AC
Start: 2018-03-04 — End: 2018-03-04
  Administered 2018-03-04: 22:00:00 2000 mL via ORAL

## 2018-03-04 MED ORDER — LIDOCAINE HCL (CARDIAC) PF 100 MG/5ML IV SOLN
100 | INTRAVENOUS | Status: AC
Start: 2018-03-04 — End: 2018-03-04

## 2018-03-04 MED ORDER — LIDOCAINE HCL (PF) 2 % IJ SOLN
2 % | INTRAMUSCULAR | Status: DC | PRN
Start: 2018-03-04 — End: 2018-03-04
  Administered 2018-03-04 (×2): 50 via INTRAVENOUS

## 2018-03-04 MED ORDER — ONDANSETRON HCL 4 MG/2ML IJ SOLN
4 MG/2ML | INTRAMUSCULAR | Status: DC | PRN
Start: 2018-03-04 — End: 2018-03-04
  Administered 2018-03-04: 12:00:00 4 via INTRAVENOUS

## 2018-03-04 MED ORDER — PROPOFOL 200 MG/20ML IV EMUL
200 MG/20ML | INTRAVENOUS | Status: DC | PRN
Start: 2018-03-04 — End: 2018-03-04
  Administered 2018-03-04: 11:00:00 30 via INTRAVENOUS
  Administered 2018-03-04 (×2): 10 via INTRAVENOUS
  Administered 2018-03-04: 11:00:00 20 via INTRAVENOUS
  Administered 2018-03-04 (×3): 10 via INTRAVENOUS

## 2018-03-04 MED ORDER — MAGNESIUM CITRATE PO SOLN
Freq: Once | ORAL | Status: AC
Start: 2018-03-04 — End: 2018-03-04
  Administered 2018-03-04: 16:00:00 296 mL via ORAL

## 2018-03-04 MED ORDER — SODIUM CHLORIDE 0.9 % IV SOLN
0.9 % | INTRAVENOUS | Status: DC | PRN
Start: 2018-03-04 — End: 2018-03-04
  Administered 2018-03-04: 11:00:00 via INTRAVENOUS

## 2018-03-04 MED ORDER — PROPOFOL 200 MG/20ML IV EMUL
200 | INTRAVENOUS | Status: AC
Start: 2018-03-04 — End: 2018-03-04

## 2018-03-04 MED ORDER — ONDANSETRON HCL 4 MG/2ML IJ SOLN
4 | INTRAMUSCULAR | Status: AC
Start: 2018-03-04 — End: 2018-03-04

## 2018-03-04 MED ORDER — HYDRALAZINE HCL 20 MG/ML IJ SOLN
20 | INTRAMUSCULAR | Status: AC
Start: 2018-03-04 — End: 2018-03-04

## 2018-03-04 MED ORDER — HYDRALAZINE HCL 20 MG/ML IJ SOLN
20 MG/ML | INTRAMUSCULAR | Status: DC | PRN
Start: 2018-03-04 — End: 2018-03-04
  Administered 2018-03-04: 12:00:00 5 via INTRAVENOUS

## 2018-03-04 MED ORDER — LACTATED RINGERS IV SOLN
Freq: Once | INTRAVENOUS | Status: AC
Start: 2018-03-04 — End: 2018-03-04
  Administered 2018-03-04: 11:00:00 via INTRAVENOUS

## 2018-03-04 MED ORDER — METOCLOPRAMIDE HCL 5 MG/ML IJ SOLN
5 MG/ML | Freq: Once | INTRAMUSCULAR | Status: AC
Start: 2018-03-04 — End: 2018-03-04
  Administered 2018-03-04: 22:00:00 5 mg via INTRAVENOUS

## 2018-03-04 MED ORDER — SODIUM CHLORIDE 0.9 % IJ SOLN
0.9 | INTRAMUSCULAR | Status: AC
Start: 2018-03-04 — End: 2018-03-04

## 2018-03-04 MED FILL — FAMOTIDINE 20 MG/2ML IV SOLN: 20 MG/2ML | INTRAVENOUS | Qty: 2

## 2018-03-04 MED FILL — HYDRALAZINE HCL 20 MG/ML IJ SOLN: 20 mg/mL | INTRAMUSCULAR | Qty: 1

## 2018-03-04 MED FILL — LACTATED RINGERS IV SOLN: INTRAVENOUS | Qty: 1000

## 2018-03-04 MED FILL — LIDOCAINE HCL (CARDIAC) PF 100 MG/5ML IV SOLN: 100 MG/5ML | INTRAVENOUS | Qty: 5

## 2018-03-04 MED FILL — SODIUM CHLORIDE 0.9 % IJ SOLN: 0.9 % | INTRAMUSCULAR | Qty: 10

## 2018-03-04 MED FILL — FUROSEMIDE 40 MG PO TABS: 40 mg | ORAL | Qty: 1

## 2018-03-04 MED FILL — METOCLOPRAMIDE HCL 5 MG/ML IJ SOLN: 5 mg/mL | INTRAMUSCULAR | Qty: 2

## 2018-03-04 MED FILL — DOK 100 MG PO CAPS: 100 mg | ORAL | Qty: 1

## 2018-03-04 MED FILL — MAGNESIUM CITRATE 1.745 GM/30ML PO SOLN: 1.745 GM/30ML | ORAL | Qty: 296

## 2018-03-04 MED FILL — NIFEDIPINE ER OSMOTIC RELEASE 30 MG PO TB24: 30 mg | ORAL | Qty: 1

## 2018-03-04 MED FILL — CARVEDILOL 3.125 MG PO TABS: 3.125 mg | ORAL | Qty: 1

## 2018-03-04 MED FILL — POLYETHYLENE GLYCOL 3350 17 G PO PACK: 17 g | ORAL | Qty: 1

## 2018-03-04 MED FILL — STRESS B-COMPLEX/VIT C/ZINC PO TABS: ORAL | Qty: 1

## 2018-03-04 MED FILL — GOLYTELY 236 G PO SOLR: 236 g | ORAL | Qty: 4000

## 2018-03-04 MED FILL — PROPOFOL 200 MG/20ML IV EMUL: 200 MG/20ML | INTRAVENOUS | Qty: 20

## 2018-03-04 MED FILL — ONDANSETRON HCL 4 MG/2ML IJ SOLN: 4 MG/2ML | INTRAMUSCULAR | Qty: 2

## 2018-03-04 NOTE — Progress Notes (Signed)
Daily Progress Note     patient is s/p c-scope-no obvious lesion but need repeat   Remain in sinus rate stable and BP stable  RF on HD  Hx of PAFIB rate in 50's  Hx of CAD s/p CABG\\  Anemia work up pending  Keep current cardiac meds    Echo --Summary  ??This is a limited echo to assess ejection fraction and RVSP.  ??Left ventricular systolic function is normal.  ??Ejection fraction is visually estimated at >50%.  ??Moderate left ventricular hypertrophy.  ??Right ventricular systolic pressure of 47 mm Hg consistent with moderate  ??pulmonary hypertension.  ??Mild aortic insufficiency seen.  ??Mitral annular calcification is present.  ??Mild mitral regurgitation is present.  ??Mild tricuspid regurgitation.  ??No evidence of pericardial effusion.      The patient had a bypass surgery done. ??Her last heart catheterization  was done in 2011. ??Left main is patent, LAD was 100% occluded, circ was  80% stenosis, RCA with 80% stenosis, SVG to RCA was patent, SVG to OM  was patent, LIMA to LAD was also patent at that time. ??The heart cath  was in 2011. ??Echo was done in 12/2017. ??LV function was preserved,  severe mitral annular calcification was noted. ??Diastolic dysfunction  present. ??The patient had an Holter done in 12/2017, sinus rhythm with  episodes of paroxysmal atrial fibrillation is noted and also  nonsustained ventricular tachycardia noted. ??Last stress test was done  in 12/2017. ??LV function was preserved. ??No ischemia was present.  ??             Objective:   BP (!) 158/45    Pulse 66    Temp 98 ??F (36.7 ??C) (Oral)    Resp 19    Ht 4\' 7"  (1.397 m)    Wt 80 lb 11 oz (36.6 kg)    SpO2 100%    BMI 18.75 kg/m??       Intake/Output Summary (Last 24 hours) at 03/04/2018 0942  Last data filed at 03/04/2018 0813  Gross per 24 hour   Intake 1100 ml   Output 1506 ml   Net -406 ml       Medications:   Scheduled Meds:  ??? magnesium citrate  296 mL Oral Once   ??? polyethylene glycol  2,000 mL Oral Once   ??? famotidine (PEPCID) injection  20  mg Intravenous Daily   ??? sodium chloride  250 mL Intravenous Once   ??? darbepoetin alfa-polysorbate  60 mcg Subcutaneous Weekly - Thursday   ??? insulin lispro  0-6 Units Subcutaneous TID WC   ??? insulin lispro  0-3 Units Subcutaneous 2 times per day   ??? 0.9 % sodium chloride  250 mL Intravenous Once   ??? sodium chloride flush  10 mL Intravenous 2 times per day   ??? carvedilol  3.125 mg Oral BID   ??? docusate sodium  100 mg Oral BID   ??? fluticasone  1 spray Nasal Daily   ??? furosemide  40 mg Oral BID   ??? NIFEdipine  30 mg Oral BID   ??? b complex-C-E-zinc  1 tablet Oral Daily      Infusions:  ??? dextrose        PRN Meds:  polyvinyl alcohol, acetaminophen, oxyCODONE, sodium chloride flush, ondansetron, glucose, dextrose, glucagon (rDNA), dextrose       Physical Exam:  Vitals:    03/04/18 0853   BP: (!) 158/45   Pulse: 66   Resp: 19  Temp:    SpO2: 100%        General: awake alert   Chest: Nontender  Cardiac: sinus   Lungs:Clear to auscultation and percussion.  Abdomen: Soft, NT, ND, +BS  Extremities: no edema   Vascular:  Equal 2+ peripheral pulses.        Lab Data:  CBC:   Recent Labs     03/03/18  0629 03/03/18  1638 03/03/18  2221   HGB 7.7* 8.6* 7.9*   HCT 24.3* 27.4* 25.5*     BMP:   Recent Labs     03/02/18  0344 03/03/18  0629 03/03/18  1002   NA 138 134* 133*   K 4.4 4.4 4.4   CL 104 102 101   CO2 21 19* 19*   PHOS 3.9 4.8  --    BUN 59* 68* 69*   CREATININE 2.7* 3.0* 3.0*     LIVER PROFILE: No results for input(s): AST, ALT, LIPASE, BILIDIR, BILITOT, ALKPHOS in the last 72 hours.    Invalid input(s):  AMYLASE,  ALB  PT/INR: No results for input(s): PROTIME, INR in the last 72 hours.  APTT: No results for input(s): APTT in the last 72 hours.  BNP:  No results for input(s): BNP in the last 72 hours.      Assessment:  Patient Active Problem List    Diagnosis Date Noted   ??? Anemia due to chronic kidney disease 02/27/2018   ??? Acute renal failure superimposed on stage 4 chronic kidney disease ([place]) 02/24/2018   ???  Uncontrolled type 2 diabetes mellitus ([place]) 02/24/2018   ??? Acute on chronic blood loss anemia 02/24/2018   ??? Traumatic seroma of left lower leg ([place]) 09/19/2017   ??? AKI (acute kidney injury) ([place]) 09/16/2017   ??? ARF (acute renal failure) ([place]) 09/15/2017   ??? Essential hypertension 04/25/2017   ??? PAF (paroxysmal atrial fibrillation) ([place]) 04/25/2017   ??? Stage 4 chronic kidney disease ([place]) 04/25/2017   ??? Chest pain 04/28/2015   ??? Type II or unspecified type diabetes mellitus without mention of complication, not stated as uncontrolled    ??? Shortness of breath on exertion    ??? Anemia of chronic disease    ??? Kidney problem    ??? CAD (coronary artery disease)    ??? Arthritis        Scot Jun, MD 03/04/2018 9:42 AM

## 2018-03-04 NOTE — Progress Notes (Signed)
Hospitalist Progress Note      Name:  Namine Beahm DOB/Age/Sex: 11-12-1928  (84 y.o. female)   MRN & CSN:  [id number] & [id number] Admission Date/Time: 02/24/2018  9:52 AM   Location:  3001/3001-A PCP: [name], MD         Hospital Day: 9    Assessment and Plan:   Judy Velasquez is a 84 y.o.  female  who presents with Acute renal failure superimposed on stage 4 chronic kidney disease ([place])          DC plan Home with [facility]  Repeat Colonoscopy tomorrow        Acute on chronic anemia secondary to blood loss  Likely UGIB  Hgb 6.8  --> 7.7 s/p 2U during HD  H/H Q8h  Occult blood +Ve  GI consult--> EGD negative.   Dr Edwena Felty repeat prep and colonoscopy       AKI on CKD stage 4  Tunneled cath placed --> HD  Op dialysis next week  Daily weights  I/O  Nephrology on board  Per CM: Pt has been approved for dialysis at [facility] in [place].  ??  DM   Cont sliding scale insulin  CB monitoring  ??  CAD  PAF  hyperlipidemia  hypertension  Cont home meds  Cardio consult  ??    Chronic treatment continued per home medications unless  contraindicated by above plan and assessment.    The above assessment/plan has been explained to the patient, who indicated understanding.      Diet DIET CLEAR LIQUID; No Added Salt (3-4 GM)  Diet NPO, After Midnight   DVT Prophylaxis []  Lovenox, []   Heparin, [x]  SCDs, [] No VTE prophylaxis, patient ambulating   GI Prophylaxis []  PPI, [x]  H2 Blocker, []  No GI prophylaxis, patient is receiving diet/Tube Feeds   Code Status Full Code             History of Present Illness:     Chief Complaint: Acute renal failure superimposed on stage 4 chronic kidney disease ([place])    Patient was seen and examined at bedside today. Patient sitting up comfortably in bed in NAD.   Ten point ROS reviewed negative, unless as noted above    Objective:       Intake/Output Summary (Last 24 hours) at 03/04/2018 1310  Last data filed at 03/04/2018 1259  Gross per 24 hour   Intake 960 ml   Output --   Net 960 ml      Vitals:   Vitals:     03/04/18 0853   BP: (!) 158/45   Pulse: 66   Resp: 19   Temp:    SpO2: 100%     Physical Exam:      GEN Awake female, sitting upright in bed in no apparent distress. Appears given age.  EYES Pupils are equally round.  No scleral erythema, discharge, or conjunctivitis.  HENT Mucous membranes are moist.   RESP Clear to auscultation, no wheezes, rales or rhonchi.    CARDIO/VASC S1/S2 auscultated. No murmurs  GI Abdomen is soft without significant tenderness, masses, or guarding.   MSK No gross joint deformities. Spontaneous movement of all extremities  SKIN Normal coloration, warm, dry.  NEURO Grossly normal  PSYCH Awake, alert, oriented     Medications:   Medications:   ??? polyethylene glycol  2,000 mL Oral Once   ??? famotidine (PEPCID) injection  20 mg Intravenous Daily   ??? sodium chloride  250 mL Intravenous  Once   ??? darbepoetin alfa-polysorbate  60 mcg Subcutaneous Weekly - Thursday   ??? insulin lispro  0-6 Units Subcutaneous TID WC   ??? insulin lispro  0-3 Units Subcutaneous 2 times per day   ??? 0.9 % sodium chloride  250 mL Intravenous Once   ??? sodium chloride flush  10 mL Intravenous 2 times per day   ??? carvedilol  3.125 mg Oral BID   ??? docusate sodium  100 mg Oral BID   ??? fluticasone  1 spray Nasal Daily   ??? furosemide  40 mg Oral BID   ??? NIFEdipine  30 mg Oral BID   ??? b complex-C-E-zinc  1 tablet Oral Daily      Infusions:   ??? dextrose       PRN Meds:     polyvinyl alcohol 1 drop PRN   acetaminophen 650 mg Q4H PRN   oxyCODONE 2.5 mg Q4H PRN   sodium chloride flush 10 mL PRN   ondansetron 4 mg Q6H PRN   glucose 15 g PRN   dextrose 12.5 g PRN   glucagon (rDNA) 1 mg PRN   dextrose 100 mL/hr PRN         Electronically signed by Bailey Mech, MD on 03/04/2018 at 1:10 PM

## 2018-03-04 NOTE — Plan of Care (Signed)
Completed mag citrate at 1315. Ate broth at 1400 today. At 1500, vomited 200 cc clear yellow emesis. Notified Dr Edwena Felty, he is aware.

## 2018-03-04 NOTE — Progress Notes (Signed)
[facility] [address] [place] [address]  Phone: [phone]  Office Hours: 8:30AM - 4:30PM  Monday - Friday     Nephrology Progress Note  03/04/2018 8:48 AM  Subjective:   Admit Date: 02/24/2018  PCP: [name], MD  Interval History:   cscope today  Feels good  Diet: Diet NPO Time Specified      Data:   Scheduled Meds:  ??? polyethylene glycol  17 g Oral Q8H   ??? famotidine (PEPCID) injection  20 mg Intravenous Daily   ??? polyethylene glycol  4,000 mL Oral Once   ??? sodium chloride  250 mL Intravenous Once   ??? darbepoetin alfa-polysorbate  60 mcg Subcutaneous Weekly - Thursday   ??? insulin lispro  0-6 Units Subcutaneous TID WC   ??? insulin lispro  0-3 Units Subcutaneous 2 times per day   ??? 0.9 % sodium chloride  250 mL Intravenous Once   ??? sodium chloride flush  10 mL Intravenous 2 times per day   ??? carvedilol  3.125 mg Oral BID   ??? docusate sodium  100 mg Oral BID   ??? fluticasone  1 spray Nasal Daily   ??? furosemide  40 mg Oral BID   ??? NIFEdipine  30 mg Oral BID   ??? b complex-C-E-zinc  1 tablet Oral Daily     Continuous Infusions:  ??? dextrose       PRN Meds:polyvinyl alcohol, acetaminophen, oxyCODONE, sodium chloride flush, ondansetron, glucose, dextrose, glucagon (rDNA), dextrose  I/O last 3 completed shifts:  In: 500   Out: 1506   I/O this shift:  In: 600 [I.V.:600]  Out: -     Intake/Output Summary (Last 24 hours) at 03/04/2018 0848  Last data filed at 03/04/2018 0813  Gross per 24 hour   Intake 1100 ml   Output 1506 ml   Net -406 ml     CBC:   Recent Labs     03/03/18  0629 03/03/18  1638 03/03/18  2221   HGB 7.7* 8.6* 7.9*     BMP:    Recent Labs     03/02/18  0344 03/03/18  0629 03/03/18  1002   NA 138 134* 133*   K 4.4 4.4 4.4   CL 104 102 101   CO2 21 19* 19*   BUN 59* 68* 69*   CREATININE 2.7* 3.0* 3.0*   GLUCOSE 99 94 135*     Hepatic:   No results for input(s): AST, ALT, ALB, BILITOT, ALKPHOS in the last 72 hours.  Troponin: No results for input(s): TROPONINI in the last 72  hours.  BNP: No results for input(s): BNP in the last 72 hours.  Lipids: No results for input(s): CHOL, HDL in the last 72 hours.    Invalid input(s): LDLCALCU  ABGs: No results found for: PHART, PO2ART, PCO2ART  INR:   No results for input(s): INR in the last 72 hours.    Objective:   Vitals: BP (!) 145/45    Pulse 66    Temp 98 ??F (36.7 ??C) (Oral)    Resp 20    Ht 4\' 7"  (1.397 m)    Wt 80 lb 11 oz (36.6 kg)    SpO2 100%    BMI 18.75 kg/m??   General appearance: alert and cooperative with exam cachectic  HEENT: Head: Normal, normocephalic, atraumatic.  Neck: no adenopathy, no carotid bruit, no JVD, supple, symmetrical, trachea midline and thyroid not enlarged, symmetric, no tenderness/mass/nodules  Lungs: clear to auscultation bilaterally  Heart: regular rate and rhythm, S1, S2 normal, no murmur, click, rub or gallop  Abdomen: soft, non-tender; bowel sounds normal; no masses,  no organomegaly  Extremities: extremities normal, atraumatic, no cyanosis or edema  Neurologic: Mental status: alertness: alert    Assessment and Plan:     Patient Active Problem List:     Type II or unspecified type diabetes mellitus without mention of complication, not stated as uncontrolled     Shortness of breath on exertion     Anemia of chronic disease     Kidney problem     CAD (coronary artery disease)     Arthritis     Chest pain     Essential hypertension     PAF (paroxysmal atrial fibrillation) (HCC)     Stage 4 chronic kidney disease (HCC)     ARF (acute renal failure) (HCC)     AKI (acute kidney injury) (HCC)     Traumatic seroma of left lower leg     Acute renal failure superimposed on stage 4 chronic kidney disease (HCC)     Uncontrolled type 2 diabetes mellitus (HCC)     Acute on chronic blood loss anemia (HCC)  IMP  AKI on CKD4 improved after dialysis-  Uremia better   Acidosis resolved  Anemia - post transfusion- adequate iron stores- Hb 7.9 slowly dropping      Plan    Dialysis Friday  OP dialysis at [facility], [place]
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
## 2018-03-04 NOTE — Anesthesia Pre-Procedure Evaluation (Addendum)
Department of Anesthesiology  Preprocedure Note       Name:  Judy Velasquez   Age:  84 y.o.  DOB:  January 15, 1929                                          MRN:  [id number]         Date:  03/04/2018      Surgeon: Moishe Spice):  Roylene Reason, MD    Procedure: COLONOSCOPY DIAGNOSTIC (N/A )    Medications prior to admission:   Prior to Admission medications    Medication Sig Start Date End Date Taking? Authorizing Provider   iron polysaccharides (FERREX 150) 150 MG capsule Take 150 mg by mouth 12/22/17  Yes Historical Provider, MD   acetaminophen (TYLENOL) 500 MG tablet Take 1,000 mg by mouth    Historical Provider, MD   metolazone (ZAROXOLYN) 2.5 MG tablet  02/02/18   Historical Provider, MD   ondansetron (ZOFRAN-ODT) 8 MG TBDP disintegrating tablet  01/26/18   Historical Provider, MD   polyethylene glycol (GLYCOLAX) packet Take 17 g by mouth    Historical Provider, MD   HYDROcodone-acetaminophen (NORCO) 5-325 MG per tablet Take 1 tablet by mouth every 6 hours as needed for Pain.    Historical Provider, MD   furosemide (LASIX) 40 MG tablet Take 40 mg by mouth 2 times daily    Historical Provider, MD   isosorbide dinitrate (ISORDIL) 30 MG tablet Take 1 tablet by mouth 3 times daily 09/26/17   Alva Garnet, MD   hydrALAZINE (APRESOLINE) 100 MG tablet Take 1 tablet by mouth 3 times daily 09/26/17   Alva Garnet, MD   carvedilol (COREG) 3.125 MG tablet Take 1 tablet by mouth 2 times daily 09/26/17   Alva Garnet, MD   NIFEdipine (ADALAT CC) 30 MG extended release tablet Take 1 tablet by mouth 2 times daily 09/26/17   Alva Garnet, MD   docusate sodium (PHILLIPS STOOL SOFTENER) 100 MG capsule Take 1 capsule by mouth 2 times daily 09/26/17   Alva Garnet, MD   B Complex-Biotin-FA (SUPER B-50 B COMPLEX PO) Take by mouth    Historical Provider, MD   fluticasone (FLONASE) 50 MCG/ACT nasal spray 1 spray by Nasal route daily    Historical Provider, MD       Current medications:    Current Facility-Administered Medications   Medication Dose Route  Frequency Provider Last Rate Last Dose   . polyethylene glycol (GoLYTELY) solution 2,000 mL  2,000 mL Oral Once Roylene Reason, MD       . polyvinyl alcohol (LIQUIFILM TEARS) 1.4 % ophthalmic solution 1 drop  1 drop Both Eyes PRN Corinna Capra, MD   1 drop at 03/03/18 0050   . famotidine (PEPCID) injection 20 mg  20 mg Intravenous Daily Corinna Capra, MD   20 mg at 03/04/18 1019   . 0.9 % sodium chloride bolus  250 mL Intravenous Once Abel Presto, MD       . acetaminophen (TYLENOL) tablet 650 mg  650 mg Oral Q4H PRN Cheryll Cockayne, MD   650 mg at 03/02/18 2055   . oxyCODONE (ROXICODONE) immediate release tablet 2.5 mg  2.5 mg Oral Q4H PRN Cheryll Cockayne, MD   2.5 mg at 03/03/18 1504   . darbepoetin alfa-polysorbate (ARANESP) injection 60 mcg  60 mcg Subcutaneous Weekly - Thursday Javier Docker, MD   [phone]  mcg at 02/26/18 1507   . insulin lispro (HUMALOG) injection vial 0-6 Units  0-6 Units Subcutaneous TID WC Quinn Plowman, MD   1 Units at 03/03/18 1653   . insulin lispro (HUMALOG) injection vial 0-3 Units  0-3 Units Subcutaneous 2 times per day Quinn Plowman, MD   1 Units at 03/01/18 2355   . 0.9 % sodium chloride infusion 250 mL  250 mL Intravenous Once Javier Docker, MD       . sodium chloride flush 0.9 % injection 10 mL  10 mL Intravenous 2 times per day [name], APRN - CNP   10 mL at 03/04/18 1022   . sodium chloride flush 0.9 % injection 10 mL  10 mL Intravenous PRN [name], APRN - CNP       . ondansetron (ZOFRAN) injection 4 mg  4 mg Intravenous Q6H PRN [name], APRN - CNP   4 mg at 03/03/18 1735   . carvedilol (COREG) tablet 3.125 mg  3.125 mg Oral BID [name], APRN - CNP   3.125 mg at 03/04/18 1019   . docusate sodium (COLACE) capsule 100 mg  100 mg Oral BID [name], APRN - CNP   100 mg at 03/04/18 1018   . fluticasone (FLONASE) 50 MCG/ACT nasal spray 1 spray  1 spray Nasal Daily Angelita Ramos, APRN - CNP   1 spray at 03/04/18 1022   . furosemide (LASIX) tablet 40 mg  40 mg Oral BID  Angelita Ramos, APRN - CNP   40 mg at 03/04/18 1700   . NIFEdipine (PROCARDIA XL) extended release tablet 30 mg  30 mg Oral BID Angelita Ramos, APRN - CNP   30 mg at 03/04/18 1019   . b complex-C-E-zinc (STRESS FORMULA W/ ZINC) 1 tablet  1 tablet Oral Daily Angelita Ramos, APRN - CNP   1 tablet at 03/04/18 1019   . glucose (GLUTOSE) 40 % oral gel 15 g  15 g Oral PRN [name], APRN - CNP       . dextrose 50 % IV solution  12.5 g Intravenous PRN [name], APRN - CNP       . glucagon (rDNA) injection 1 mg  1 mg Intramuscular PRN [name], APRN - CNP       . dextrose 5 % solution  100 mL/hr Intravenous PRN [name], APRN - CNP           Allergies:    Allergies   Allergen Reactions   . Celebrex [Celecoxib] Shortness Of Breath, Swelling and Anaphylaxis     "Swelling Of The Esophagus"   . Fish Allergy Anaphylaxis   . Lisinopril Other (See Comments)     Dry Mouth   . Neurontin [Gabapentin] Other (See Comments)     Dizziness   . Norvasc [Amlodipine Besylate] Other (See Comments)     Edema Of Legs   . Other      "Allergic To Certain Seafoods Causing Headaches, Difficulty Breathing"   . Shellfish Allergy Anaphylaxis   . Ciprofloxacin Rash   . Lipitor Other (See Comments)     Ache   . Losartan      Per nephrologist       Problem List:    Patient Active Problem List   Diagnosis Code   . Type II or unspecified type diabetes mellitus without mention of complication, not stated as uncontrolled E11.9   . Shortness of breath on exertion R06.02   . Anemia of  chronic disease D63.8   . Kidney problem N28.9   . CAD (coronary artery disease) I25.10   . Arthritis M19.90   . Chest pain R07.9   . Essential hypertension I10   . PAF (paroxysmal atrial fibrillation) (HCC) I48.0   . Stage 4 chronic kidney disease (HCC) N18.4   . ARF (acute renal failure) (HCC) N17.9   . AKI (acute kidney injury) (HCC) N17.9   . Traumatic seroma of left lower leg (HCC) T79.2XXA   . Acute renal failure superimposed on stage 4 chronic kidney  disease (HCC) N17.9, N18.4   . Uncontrolled type 2 diabetes mellitus (HCC) E11.65   . Acute on chronic blood loss anemia D62   . Anemia due to chronic kidney disease N18.9, D63.1       Past Medical History:        Diagnosis Date   . Allergic rhinitis    . Anemia    . Anesthesia     Nausea/Vomiting Post Op In Past   . Anxiety    . Arthritis     s/p bilateral hip and right knee replacement sees Dr. Salomon Mast   . Back problem     "Occ Back Hurts"   . Blood transfusion    . CAD (coronary artery disease) 2008    Sees Dr. Marcelino Freestone CABG x 3   . DCIS (ductal carcinoma in situ) 05/20/2011   . Glaucoma     Bilateral Eyes   . H/O cardiovascular stress test 05/20/11   . Heart attack (HCC) 2006   . HOH (hard of hearing)     Bilateral Ears   . Hyperlipidemia    . Hypertension    . IBS (irritable bowel syndrome)    . Kidney problem     "Sees  Dr. Wallis Bamberg For Blood Pressure And Kidneys"   . Nausea & vomiting     Nausea/Vomiting Post Op In Past   . Osteoarthritis    . Osteoporosis    . Right Breast Cancer Dx 7-12   . Shortness of breath on exertion    . Staph infection 1980's    "They sent me to a tropical infection doctor"   . TB (tuberculosis)     "Probably had it as a child, my dad had it"   . Type II or unspecified type diabetes mellitus without mention of complication, not stated as uncontrolled Dx 1980's       Past Surgical History:        Procedure Laterality Date   . BREAST LUMPECTOMY  05/20/2011    Right   . BREAST SURGERY  7-12    Right Breast Biopsy   . CARDIAC SURGERY  2008    CABG (3 Bypasses)   . CARPAL TUNNEL RELEASE  2000's    Right   . CESAREAN SECTION  "Late 1950's or Early 1960's"    X 3   . CHOLECYSTECTOMY, LAPAROSCOPIC  1990's   . COLONOSCOPY  2000's   . COLONOSCOPY N/A 03/04/2018    COLONOSCOPY INCOMPLETE DUE TO POOR PREP performed by Roylene Reason, MD at [facility] ENDOSCOPY   . CORONARY ARTERY BYPASS GRAFT  2008    cabg x 3   . ENDOSCOPY, COLON, DIAGNOSTIC  1990's   . HYSTERECTOMY, TOTAL ABDOMINAL  1960's   . JOINT  REPLACEMENT  1980's    Total Right Hip   . JOINT REPLACEMENT  2000's    Total Right Hip   . JOINT REPLACEMENT  1990's  Total Left Hip   . JOINT REPLACEMENT  1980's    Total Right Knee   . OTHER SURGICAL HISTORY      Family Physician Is Dr. Berneice Heinrich In [place], [place]   . OTHER SURGICAL HISTORY  2000's    "Took section of my intestines out, it was a exploratory operation, they found scar tissue from the c-sections"   . TONSILLECTOMY AND ADENOIDECTOMY  1939   . UPPER GASTROINTESTINAL ENDOSCOPY N/A 09/18/2017    EGD BIOPSY performed by Roylene Reason, MD at [facility] ENDOSCOPY   . UPPER GASTROINTESTINAL ENDOSCOPY N/A 02/28/2018    EGD DIAGNOSTIC ONLY performed by Marvis Repress, MD at [facility] ENDOSCOPY       Social History:    Social History     Tobacco Use   . Smoking status: Never Smoker   . Smokeless tobacco: Never Used   Substance Use Topics   . Alcohol use: No                                Counseling given: Not Answered      Vital Signs (Current):   Vitals:    03/03/18 2042 03/04/18 0257 03/04/18 0853 03/04/18 1615   BP: (!) 145/45  (!) 158/45 (!) 122/42   Pulse:   66 58   Resp: 20  19 15    Temp: 36.2 C (97.2 F) 36.7 C (98 F)  36.3 C (97.4 F)   TempSrc: Oral Oral  Oral   SpO2: 100%  100% 100%   Weight:  80 lb 11 oz (36.6 kg)     Height:                                                  BP Readings from Last 3 Encounters:   03/04/18 (!) 122/42   02/28/18 (!) 142/54   01/13/18 (!) 155/55       NPO Status: Time of last liquid consumption: 2300                        Time of last solid consumption: 1200                        Date of last liquid consumption: 03/03/18                        Date of last solid food consumption: 03/02/18    BMI:   Wt Readings from Last 3 Encounters:   03/04/18 80 lb 11 oz (36.6 kg)   01/13/18 95 lb (43.1 kg)   12/02/17 107 lb (48.5 kg)     Body mass index is 18.75 kg/m.    CBC:   Lab Results   Component Value Date    WBC 6.8 02/27/2018    RBC 2.04 02/27/2018    HGB 7.3 03/04/2018    HCT  23.5 03/04/2018    MCV 97.1 02/27/2018    RDW 19.1 02/27/2018    PLT 141 02/27/2018       CMP:   Lab Results   Component Value Date    NA 135 03/04/2018    K 4.8 03/04/2018    CL 103 03/04/2018    CO2 21 03/04/2018  BUN 35 03/04/2018    CREATININE 2.0 03/04/2018    GFRAA 29 03/04/2018    LABGLOM 24 03/04/2018    GLUCOSE 96 03/04/2018    PROT 5.0 02/25/2018    PROT 6.5 04/20/2010    CALCIUM 8.1 03/04/2018    BILITOT 0.9 02/25/2018    ALKPHOS 48 02/25/2018    AST 13 02/25/2018    ALT <5 02/25/2018       POC Tests:   Recent Labs     03/04/18  1608   POCGLU 119*       Coags:   Lab Results   Component Value Date    PROTIME 10.7 02/24/2018    PROTIME 11.8 10/23/2009    INR 0.94 02/24/2018    APTT 43.4 02/24/2018       HCG (If Applicable): No results found for: PREGTESTUR, PREGSERUM, HCG, HCGQUANT     ABGs: No results found for: PHART, PO2ART, PCO2ART, HCO3ART, BEART, O2SATART     Type & Screen (If Applicable):  No results found for: LABABO, LABRH    Anesthesia Evaluation   no history of anesthetic complications:   Airway: Mallampati: II  TM distance: >3 FB   Neck ROM: full  Mouth opening: > = 3 FB Dental: normal exam         Pulmonary:   (+) shortness of breath: chronic,                             Cardiovascular:  Exercise tolerance: poor (<4 METS),   (+) hypertension:, past MI:, CAD:, DOE: after ambulating 1 flight of stairs,                   Neuro/Psych:   (+) depression/anxiety             GI/Hepatic/Renal: Neg GI/Hepatic/Renal ROS  (+) renal disease: ARF and CRI,           Endo/Other:    (+) DiabetesType II DM, , .                 Abdominal:           Vascular:                                        Anesthesia Plan      TIVA and MAC     ASA 4       Induction: intravenous.      Anesthetic plan and risks discussed with patient.      Plan discussed with attending.                Mortimer Fries, APRN - CRNA   03/04/2018

## 2018-03-04 NOTE — Progress Notes (Signed)
Pt passing flatus with liquid dark brown/maroon stools. Bed linens changed x2. Pt medicated post procedure for nausea and states "it is better". Report called to floor vitals156/59 67-19 100% on room air. Pt transported back to room 3001 via bed.

## 2018-03-04 NOTE — Op Note (Signed)
Judy Velasquez   03/04/2018    Procedure:  Colonoscopy  Endoscopist: Antony Haste L. Edwena Felty MD  Referring Physician: [name], MD  Preprocedure Dx: rectal bleeding  Postprocedure Dx: insufficient prep  Medications: MAC-all meds per anesthesia provider    Technique:   The Olympus pediatric video colonoscope was introduced and passed to the transvrese colon. The colon was not adequately prepped and there was a large amount of thick, tarry stool. An attempt was made to pass the scope as far as possible to look for any large lesion, however I was unable to go beyond the transverse colon.No gross lesion was seen to that point.  The patient tolerated the procedure well.  Specimens: were not obtained  Complications: none  Estimated Blood Loss: <5cc  Implants: none  Drains: none    Final Impression:   1) insufficient prep   2) no gross lesion seen to the transverse colon     Disposition:    1) repeat prep and colonoscopy     NOTE: PHOTOS CAN BE FOUND NOW IN THE SOFT CHART AND IN EPIC IN A FEW DAYS-- GO TO "CHART REVIEW" THEN "MEDIA"    Edona Schreffler L. Edwena Felty MD

## 2018-03-04 NOTE — Plan of Care (Signed)
Pt started drinking magnesium citrate PO at 1210 and finished at 1315.

## 2018-03-04 NOTE — Plan of Care (Signed)
Problem: Infection:  Goal: Will remain free from infection  Description  Will remain free from infection  Outcome: Ongoing     Problem: Safety:  Goal: Free from accidental physical injury  Description  Free from accidental physical injury  Outcome: Ongoing  Goal: Free from intentional harm  Description  Free from intentional harm  Outcome: Ongoing     Problem: Daily Care:  Goal: Daily care needs are met  Description  Daily care needs are met  Outcome: Ongoing     Problem: Pain:  Goal: Patient's pain/discomfort is manageable  Description  Patient's pain/discomfort is manageable  Outcome: Ongoing  Goal: Pain level will decrease  Description  Pain level will decrease  Outcome: Ongoing  Goal: Control of acute pain  Description  Control of acute pain  Outcome: Ongoing  Goal: Control of chronic pain  Description  Control of chronic pain  Outcome: Ongoing     Problem: Discharge Planning:  Goal: Patients continuum of care needs are met  Description  Patients continuum of care needs are met  Outcome: Ongoing     Problem: Falls - Risk of:  Goal: Will remain free from falls  Description  Will remain free from falls  Outcome: Ongoing  Goal: Absence of physical injury  Description  Absence of physical injury  Outcome: Ongoing     Problem: Risk for Impaired Skin Integrity  Goal: Tissue integrity - skin and mucous membranes  Description  Structural intactness and normal physiological function of skin and  mucous membranes.  Outcome: Ongoing     Problem: Nutrition  Goal: Optimal nutrition therapy  Outcome: Ongoing

## 2018-03-04 NOTE — Progress Notes (Signed)
Tap water enema completed as ordered at 0600. Patient tolerated well.

## 2018-03-04 NOTE — Plan of Care (Signed)
Problem: Infection:  Goal: Will remain free from infection  Description  Will remain free from infection  03/04/2018 2325 by Salome Spotted, RN  Outcome: Ongoing  03/04/2018 1102 by Laural Roes, RN  Outcome: Ongoing     Problem: Safety:  Goal: Free from accidental physical injury  Description  Free from accidental physical injury  03/04/2018 2325 by Salome Spotted, RN  Outcome: Ongoing  03/04/2018 1102 by Laural Roes, RN  Outcome: Ongoing  Goal: Free from intentional harm  Description  Free from intentional harm  03/04/2018 2325 by Salome Spotted, RN  Outcome: Ongoing  03/04/2018 1102 by Laural Roes, RN  Outcome: Ongoing     Problem: Daily Care:  Goal: Daily care needs are met  Description  Daily care needs are met  03/04/2018 2325 by Salome Spotted, RN  Outcome: Ongoing  03/04/2018 1102 by Laural Roes, RN  Outcome: Ongoing     Problem: Pain:  Goal: Patient's pain/discomfort is manageable  Description  Patient's pain/discomfort is manageable  03/04/2018 2325 by Salome Spotted, RN  Outcome: Ongoing  03/04/2018 1102 by Laural Roes, RN  Outcome: Ongoing  Goal: Pain level will decrease  Description  Pain level will decrease  03/04/2018 2325 by Salome Spotted, RN  Outcome: Ongoing  03/04/2018 1102 by Laural Roes, RN  Outcome: Ongoing  Goal: Control of acute pain  Description  Control of acute pain  03/04/2018 2325 by Salome Spotted, RN  Outcome: Ongoing  03/04/2018 1102 by Laural Roes, RN  Outcome: Ongoing  Goal: Control of chronic pain  Description  Control of chronic pain  03/04/2018 2325 by Salome Spotted, RN  Outcome: Ongoing  03/04/2018 1102 by Laural Roes, RN  Outcome: Ongoing     Problem: Discharge Planning:  Goal: Patients continuum of care needs are met  Description  Patients continuum of care needs are met  03/04/2018 2325 by Salome Spotted, RN  Outcome: Ongoing  03/04/2018 1102 by Laural Roes, RN  Outcome: Ongoing     Problem: Falls - Risk of:  Goal: Will remain free from  falls  Description  Will remain free from falls  03/04/2018 2325 by Salome Spotted, RN  Outcome: Ongoing  03/04/2018 1102 by Laural Roes, RN  Outcome: Ongoing  Goal: Absence of physical injury  Description  Absence of physical injury  03/04/2018 2325 by Salome Spotted, RN  Outcome: Ongoing  03/04/2018 1102 by Laural Roes, RN  Outcome: Ongoing     Problem: Risk for Impaired Skin Integrity  Goal: Tissue integrity - skin and mucous membranes  Description  Structural intactness and normal physiological function of skin and  mucous membranes.  03/04/2018 2325 by Salome Spotted, RN  Outcome: Ongoing  03/04/2018 1102 by Laural Roes, RN  Outcome: Ongoing     Problem: Nutrition  Goal: Optimal nutrition therapy  03/04/2018 2325 by Salome Spotted, RN  Outcome: Ongoing  03/04/2018 1102 by Laural Roes, RN  Outcome: Ongoing

## 2018-03-04 NOTE — Plan of Care (Signed)
Pt has had 3 large loose black BM's during this shift. Started drinking golytely at [employer] and drank 3 8oz glasses before night shift. Shift report given to Eugenia RN at [phone]. Night shift advised to notify Dr Jacqulynn Cadet if pt is unable to finish golytely before 2100 tonight.

## 2018-03-04 NOTE — Progress Notes (Signed)
Pt IV will not flush IV restarted in preop per protocol. Procedure delayed. Pre procedure enema and bowel prep verified with RN and Dr Edwena Felty. Npo status verified and last dose of coreg was last pm. Pt states no family or friends here at this time. Allergies verified.

## 2018-03-04 NOTE — Anesthesia Post-Procedure Evaluation (Signed)
Department of Anesthesiology  Postprocedure Note    Patient: Judy Velasquez  MRN: [id number]  Birthdate: 11-24-1928  Date of evaluation: 03/04/2018  Time:  8:15 AM     Procedure Summary     Date:  03/04/18 Room / Location:  [facility] / [facility]    Anesthesia Start:  6270 Anesthesia Stop:  0815    Procedure:  COLONOSCOPY INCOMPLETE DUE TO POOR PREP (N/A ) Diagnosis:  (esrd)    Surgeon:  [name], MD Responsible Provider:  Ananias Pilgrim, MD    Anesthesia Type:  TIVA, general, MAC ASA Status:  3          Anesthesia Type: TIVA, general, MAC    Aldrete Phase I:      Aldrete Phase II:      Last vitals: Reviewed and per EMR flowsheets.       Anesthesia Post Evaluation    Patient location during evaluation: bedside  Patient participation: complete - patient participated  Level of consciousness: sleepy but conscious  Pain score: 0  Airway patency: patent  Nausea & Vomiting: nausea and no vomiting  Complications: no  Cardiovascular status: blood pressure returned to baseline and hemodynamically stable  Respiratory status: acceptable, room air and spontaneous ventilation  Hydration status: euvolemic    Complaints of nausea, no vomiting.  Medicated per chart

## 2018-03-04 NOTE — H&P (Signed)
Original H &P in soft chart.    I have examined the patient immediately before the procedure and there is no change in the previous history  and physical exam, which has been reviewed.There is no history of sleep apnea, snoring, or stridor.  There has been no  previous adverse experience with sedation/anesthesia.  There is no increased risk for aspiration of gastric contents.  The patient has been instructed that all resuscitative measures (during the operative and immediate perioperative period) will be instituted in the unlikely event that they will be needed. The patient has no pertinent past surgical or family history other than listed in the original H&P.    ASA Class: 3  AIRWAY Class: 1

## 2018-03-04 NOTE — Progress Notes (Signed)
Patient had multiple large bm's  During shift. Patient was unable to complete golightly within 3 hours. Patient did consume 1500 ml. Dr. Edwena Felty notified @ 2345, new order for tap water enema this evening.

## 2018-03-05 ENCOUNTER — Encounter

## 2018-03-05 LAB — CRITICAL CARE PANEL
Anion Gap: 13 (ref 4–16)
BUN: 39 MG/DL — ABNORMAL HIGH (ref 6–23)
CO2: 22 MMOL/L (ref 21–32)
Calcium: 8 MG/DL — ABNORMAL LOW (ref 8.3–10.6)
Chloride: 104 mMol/L (ref 99–110)
Creatinine: 2.5 MG/DL — ABNORMAL HIGH (ref 0.6–1.1)
GFR African American: 22 mL/min/{1.73_m2} — ABNORMAL LOW (ref >60)
GFR Non-African American: 18 mL/min/{1.73_m2} — ABNORMAL LOW (ref >60)
Glucose: 97 MG/DL (ref 70–99)
Magnesium: 2.6 mg/dl — ABNORMAL HIGH (ref 1.8–2.4)
Phosphorus: 4.4 MG/DL (ref 2.5–4.9)
Potassium: 4.4 MMOL/L (ref 3.5–5.1)
Sodium: 139 MMOL/L (ref 135–145)

## 2018-03-05 LAB — HEMOGLOBIN AND HEMATOCRIT
Hematocrit: 24.3 % — ABNORMAL LOW (ref 37–47)
Hematocrit: 25.6 % — ABNORMAL LOW (ref 37–47)
Hematocrit: 25.5 % — ABNORMAL LOW (ref 37–47)
Hemoglobin: 7.4 GM/DL — ABNORMAL LOW (ref 12.5–16.0)
Hemoglobin: 8 GM/DL — ABNORMAL LOW (ref 12.5–16.0)
Hemoglobin: 7.2 GM/DL — ABNORMAL LOW (ref 12.5–16.0)

## 2018-03-05 LAB — POCT GLUCOSE
POC Glucose: 104 MG/DL — ABNORMAL HIGH (ref 70–99)
POC Glucose: 83 MG/DL (ref 70–99)
POC Glucose: 163 MG/DL — ABNORMAL HIGH (ref 70–99)
POC Glucose: 176 MG/DL — ABNORMAL HIGH (ref 70–99)

## 2018-03-05 MED ORDER — PROPOFOL 200 MG/20ML IV EMUL
200 MG/20ML | INTRAVENOUS | Status: DC | PRN
Start: 2018-03-05 — End: 2018-03-05
  Administered 2018-03-05 (×4): 10 via INTRAVENOUS
  Administered 2018-03-05: 11:00:00 50 via INTRAVENOUS
  Administered 2018-03-05 (×3): 10 via INTRAVENOUS

## 2018-03-05 MED ORDER — FAMOTIDINE 20 MG PO TABS
20 MG | Freq: Every day | ORAL | Status: DC
Start: 2018-03-05 — End: 2018-03-06

## 2018-03-05 MED ORDER — SODIUM CHLORIDE 0.9 % IV SOLN
0.9 % | INTRAVENOUS | Status: DC | PRN
Start: 2018-03-05 — End: 2018-03-05
  Administered 2018-03-05: 11:00:00 via INTRAVENOUS

## 2018-03-05 MED ORDER — LIDOCAINE HCL (PF) 2 % IJ SOLN
2 % | INTRAMUSCULAR | Status: DC | PRN
Start: 2018-03-05 — End: 2018-03-05
  Administered 2018-03-05: 11:00:00 40 via INTRAVENOUS

## 2018-03-05 MED ORDER — FAMOTIDINE 20 MG PO TABS
20 MG | Freq: Two times a day (BID) | ORAL | Status: DC
Start: 2018-03-05 — End: 2018-03-05

## 2018-03-05 MED ORDER — SODIUM CHLORIDE 0.9 % IV SOLN
0.9 % | INTRAVENOUS | Status: DC | PRN
Start: 2018-03-05 — End: 2018-03-05

## 2018-03-05 MED FILL — ARANESP (ALBUMIN FREE) 60 MCG/ML IJ SOLN: 60 ug/mL | INTRAMUSCULAR | Qty: 1

## 2018-03-05 MED FILL — DOK 100 MG PO CAPS: 100 mg | ORAL | Qty: 1

## 2018-03-05 MED FILL — FAMOTIDINE 20 MG/2ML IV SOLN: 20 MG/2ML | INTRAVENOUS | Qty: 2

## 2018-03-05 MED FILL — CARVEDILOL 3.125 MG PO TABS: 3.125 mg | ORAL | Qty: 1

## 2018-03-05 MED FILL — NIFEDIPINE ER OSMOTIC RELEASE 30 MG PO TB24: 30 mg | ORAL | Qty: 1

## 2018-03-05 MED FILL — FUROSEMIDE 40 MG PO TABS: 40 mg | ORAL | Qty: 1

## 2018-03-05 MED FILL — SODIUM CHLORIDE 0.9 % IV SOLN: 0.9 % | INTRAVENOUS | Qty: 250

## 2018-03-05 MED FILL — STRESS B-COMPLEX/VIT C/ZINC PO TABS: ORAL | Qty: 1

## 2018-03-05 NOTE — Progress Notes (Addendum)
Pt BP 116/40, pt non symptomatic. Dr. Bernadene Person PS.

## 2018-03-05 NOTE — Progress Notes (Signed)
Physical Therapy    Physical Therapy Treatment Note  Name: Judy Velasquez MRN: [id number] DOB:   08/17/1929   Date:  03/05/2018   Admission Date: 02/24/2018 Room:  3001/3001-A   Restrictions/Precautions:  Restrictions/Precautions  Restrictions/Precautions: Fall Risk       Subjective:  Patient states:  "I got good news from the doctor today!!"  Pain:   Location, Type, Intensity (0/10 to 10/10): Denies   Objective:    Observation:    Sitting in recliner, oriented x 4. Pleasant and agreeable to work with therapy.   Treatment, including education/measures:  Sit><stand: from recliner and standard toilet without use of grab bar CGA. Minimal cues for hand sequencing   Step pivot transfers: with RW CGA to recliner/standard toilet   Standing balance/tolerance: CGA while at sink performing self care tasks (light dynamic balance with single to no UE support at times. Tolerated 3 minutes.   Ambulation: 12ft + 40ft with RW CGA. Pt reports walker is "heavy" and difficulty to push with her R shoulder (arthritis). Pt reports she uses a rollator at home which is easier to navigate.  Dec pace, dec foot clearance bilat, flexed knees, fwd posture, dec use of RUE. Dec overall activity tolerance reporting feeling fatigued at end of 47ft ambulation distance.   Exercises: Pt performed LAQ, hip flexion, and hip abduction/adduction with knee extended 10x2 each LE.   Educated on POC, role of PT, HEP, discharge recommendations, and DME.   Assessment / Impression:    Patient's tolerance of treatment:  Good    Adverse Reaction: no  Significant change in status and impact:  no  Barriers to improvement:  Activity tolerance, balance, strength.   Plan for Next Session:    Activity tolerance, balance, strengthening, transfers, ambulation, stairs.   Time in:  1020  Time out:  1044  Timed treatment minutes:  24  Total treatment time:  24    Previously filed items:  Social/Functional History  Lives With: Tax adviser and 82y.o. and other family)  Type of  Home: House  Home Layout: Two level, Able to Live on Main level with bedroom/bathroom  Home Access: Stairs to enter with rails  Entrance Stairs - Number of Steps: 3 stairs c help from family  Entrance Stairs - Rails: Both  Bathroom Shower/Tub: Medical sales representative: Bedside commode(kept next to lift chair)  Bathroom Equipment: Tub transfer Librarian, academic: Lift chair, 4 wheeled walker, [employer]  Receives Help From: Home health(nurse 1x/wk, PT amd OT 2x/wk)  ADL Assistance: Needs assistance(pt toilets c modified independence, but family assists prn with bathing and dressing)  Homemaking Responsibilities: No  Ambulation Assistance: Independent(c 4WW short distances ; w/c out in community)  Transfer Assistance: Independent(except for tub t/f which family assists with)  Active Driver: No  Patient's Driver Info: dtr  Additional Comments: recent hx of falls  Short term goals  Time Frame for Short term goals: 1 week   Short term goal 1: Pt will perform sit><supine SBA   Short term goal 2: Pt will transfer to bed/recliner SBA with RW   Short term goal 3: Pt will ambulate 42ft with RW SBA   Short term goal 4: Pt will ascend/descend 3 steps, 1-2 rails SBA        Electronically signed by:    Eusebio Friendly, [id number]  03/05/2018, 10:23 AM

## 2018-03-05 NOTE — Care Coordination-Inpatient (Signed)
CM received message from [facility] requesting update on pt start date for outpatient HD at [phone].     CM called above number and spoke with [facility] and provided update on likely dc prior to pt Monday morning treatment with tentative start date of Monday June 17th.     DC plan remains return home with granddaughter, [facility] and outpt HD at [facility] M/W/F at 6 am.

## 2018-03-05 NOTE — Progress Notes (Signed)
[facility] [address], [place] [address]  Phone: [phone]  Office Hours: 8:30AM - 4:30PM  Monday - Friday     Nephrology Progress Note  03/05/2018 8:45 AM  Subjective:   Admit Date: 02/24/2018  PCP: [name], MD  Interval History:  AvM bleed  Feels good  Diet: DIET GENERAL; Carb Control: 4 carb choices (60 gms)/meal; No Added Salt (3-4 GM); Daily Fluid Restriction: 1500 ml      Data:   Scheduled Meds:  ??? famotidine (PEPCID) injection  20 mg Intravenous Daily   ??? sodium chloride  250 mL Intravenous Once   ??? darbepoetin alfa-polysorbate  60 mcg Subcutaneous Weekly - Thursday   ??? insulin lispro  0-6 Units Subcutaneous TID WC   ??? insulin lispro  0-3 Units Subcutaneous 2 times per day   ??? 0.9 % sodium chloride  250 mL Intravenous Once   ??? sodium chloride flush  10 mL Intravenous 2 times per day   ??? carvedilol  3.125 mg Oral BID   ??? docusate sodium  100 mg Oral BID   ??? fluticasone  1 spray Nasal Daily   ??? furosemide  40 mg Oral BID   ??? NIFEdipine  30 mg Oral BID   ??? b complex-C-E-zinc  1 tablet Oral Daily     Continuous Infusions:  ??? dextrose       PRN Meds:polyvinyl alcohol, acetaminophen, oxyCODONE, sodium chloride flush, ondansetron, glucose, dextrose, glucagon (rDNA), dextrose  I/O last 3 completed shifts:  In: 2940 [P.O.:2340; I.V.:600]  Out: 200 [Emesis/NG output:200]  No intake/output data recorded.    Intake/Output Summary (Last 24 hours) at 03/05/2018 0845  Last data filed at 03/04/2018 2334  Gross per 24 hour   Intake 2340 ml   Output 200 ml   Net 2140 ml     CBC:   Recent Labs     03/04/18  0941 03/04/18  1725 03/05/18  0032   HGB 7.3* 7.4* 7.4*     BMP:    Recent Labs     03/03/18  1002 03/04/18  0941 03/05/18  0344   NA 133* 135 139   K 4.4 4.8 4.4   CL 101 103 104   CO2 19* 21 22   BUN 69* 35* 39*   CREATININE 3.0* 2.0* 2.5*   GLUCOSE 135* 96 97     Hepatic:   No results for input(s): AST, ALT, ALB, BILITOT, ALKPHOS in the last 72 hours.  Troponin: No results for input(s):  TROPONINI in the last 72 hours.  BNP: No results for input(s): BNP in the last 72 hours.  Lipids: No results for input(s): CHOL, HDL in the last 72 hours.    Invalid input(s): LDLCALCU  ABGs: No results found for: PHART, PO2ART, PCO2ART  INR:   No results for input(s): INR in the last 72 hours.    Objective:   Vitals: BP (!) 174/55    Pulse 62    Temp 98 ??F (36.7 ??C) (Oral)    Resp 15    Ht 4\' 7"  (1.397 m)    Wt 80 lb 11 oz (36.6 kg)    SpO2 100%    BMI 18.75 kg/m??   General appearance: alert and cooperative with exam cachectic  HEENT: Head: Normal, normocephalic, atraumatic.  Neck: no adenopathy, no carotid bruit, no JVD, supple, symmetrical, trachea midline and thyroid not enlarged, symmetric, no tenderness/mass/nodules  Lungs: clear to auscultation bilaterally  Heart: regular rate and  rhythm, S1, S2 normal, no murmur, click, rub or gallop  Abdomen: soft, non-tender; bowel sounds normal; no masses,  no organomegaly  Extremities: extremities normal, atraumatic, no cyanosis or edema  Neurologic: Mental status: alertness: alert    Assessment and Plan:     Patient Active Problem List:     Type II or unspecified type diabetes mellitus without mention of complication, not stated as uncontrolled     Shortness of breath on exertion     Anemia of chronic disease     Kidney problem     CAD (coronary artery disease)     Arthritis     Chest pain     Essential hypertension     PAF (paroxysmal atrial fibrillation) (HCC)     Stage 4 chronic kidney disease (HCC)     ARF (acute renal failure) (HCC)     AKI (acute kidney injury) ([place])     Traumatic seroma of left lower leg     Acute renal failure superimposed on stage 4 chronic kidney disease ([place])     Uncontrolled type 2 diabetes mellitus ([place])     Acute on chronic blood loss anemia (HCC)  IMP  AKI on CKD4 improved after dialysis-  Uremia better   Acidosis resolved  Anemia - post transfusion- adequate iron stores- Hb 7.9 slowly dropping- AVM caecum        Plan    Dialysis  Friday-OP dialysis at [facility] arranged    Claretta Fraise, MD

## 2018-03-05 NOTE — Plan of Care (Signed)
Glucose 176. Pt refused insulin. Notified Dr Wandra Scot, he is aware. No new orders.

## 2018-03-05 NOTE — Progress Notes (Signed)
Hospitalist Progress Note      Name:  Judy Velasquez DOB/Age/Sex: 1929/07/01  (84 y.o. female)   MRN & CSN:  [id number] & [id number] Admission Date/Time: 02/24/2018  9:52 AM   Location:  3001/3001-A PCP: [name], MD         Hospital Day: 10    Assessment and Plan:   [name] is a 84 y.o.  female  who presents with Acute renal failure superimposed on stage 4 chronic kidney disease ([place])          DC plan Home with [place]       Acute on chronic anemia secondary to blood loss  Likely UGIB  Hgb 6.8  --> 7.7  H/H Q8h  Occult blood +Ve  GI consult--> EGD negative.   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    AKI on CKD stage 4  Tunneled cath placed --> HD  Op dialysis next week  Daily weights  I/O  Nephrology on board  Per CM: Pt has been approved for dialysis at [facility] in [place].  ??  DM   Cont sliding scale insulin  CB monitoring  ??  CAD  PAF  hyperlipidemia  hypertension  Cont home meds  Cardio consult  ??    Chronic treatment continued per home medications unless  contraindicated by above plan and assessment.    The above assessment/plan has been explained to the patient, who indicated understanding.      Diet DIET GENERAL; Carb Control: 4 carb choices (60 gms)/meal; No Added Salt (3-4 GM); Daily Fluid Restriction: 1500 ml   DVT Prophylaxis []  Lovenox, []   Heparin, [x]  SCDs, [] No VTE prophylaxis, patient ambulating   GI Prophylaxis []  PPI, [x]  H2 Blocker, []  No GI prophylaxis, patient is receiving diet/Tube Feeds   Code Status Full Code             History of Present Illness:     Chief Complaint: Acute renal failure superimposed on stage 4 chronic kidney disease (Judy Velasquez)    Patient was seen and examined at bedside today. Patient sitting up comfortably in bed in NAD.   Ten point ROS reviewed negative, unless as noted above    Objective:        Intake/Output Summary (Last 24 hours) at 03/05/2018 1120  Last data filed at 03/04/2018 2334  Gross per 24 hour   Intake 2340 ml   Output 200 ml   Net 2140 ml      Vitals:   Vitals:    03/05/18 1057   BP: (!) 131/56   Pulse: 72   Resp: 17   Temp:    SpO2: 100%     Physical Exam:      GEN Awake female, sitting upright in bed in no apparent distress. Appears given age.  EYES Pupils are equally round.  No scleral erythema, discharge, or conjunctivitis.  HENT Mucous membranes are moist.   RESP Clear to auscultation, no wheezes, rales or rhonchi.    CARDIO/VASC S1/S2 auscultated. No murmurs  GI Abdomen is soft without significant tenderness, masses, or guarding.   MSK No gross joint deformities. Spontaneous movement of all extremities  SKIN Normal coloration, warm, dry.  NEURO Grossly normal  PSYCH Awake, alert    Medications:   Medications:   ??? famotidine  20  mg Oral BID   ??? sodium chloride  250 mL Intravenous Once   ??? darbepoetin alfa-polysorbate  60 mcg Subcutaneous Weekly - Thursday   ??? insulin lispro  0-6 Units Subcutaneous TID WC   ??? insulin lispro  0-3 Units Subcutaneous 2 times per day   ??? 0.9 % sodium chloride  250 mL Intravenous Once   ??? sodium chloride flush  10 mL Intravenous 2 times per day   ??? carvedilol  3.125 mg Oral BID   ??? docusate sodium  100 mg Oral BID   ??? fluticasone  1 spray Nasal Daily   ??? furosemide  40 mg Oral BID   ??? NIFEdipine  30 mg Oral BID   ??? b complex-C-E-zinc  1 tablet Oral Daily      Infusions:   ??? dextrose       PRN Meds:     polyvinyl alcohol 1 drop PRN   acetaminophen 650 mg Q4H PRN   oxyCODONE 2.5 mg Q4H PRN   sodium chloride flush 10 mL PRN   ondansetron 4 mg Q6H PRN   glucose 15 g PRN   dextrose 12.5 g PRN   glucagon (rDNA) 1 mg PRN   dextrose 100 mL/hr PRN         Electronically signed by Bailey Mech, MD on 03/05/2018 at 11:20 AM

## 2018-03-05 NOTE — H&P (Signed)
I have examined the patient within 24 hours  before the procedure and there is no change in the previous history and physical exam,which has been reviewed.There is no history of sleep apnea, snoring, or stridor.  There has been no  previous adverse experience with sedation/anesthesia. There is no increased risk for aspiration of gastric contents.  The patient has been instructed that all resuscitative measures (during the operative and immediate perioperative period) will be instituted in the unlikely event that they will be needed.The patient has no pertinent past surgical or FH other than listed in the original H&P.    ASA Class: 3  AIRWAY Class: 1    Consent form signed, witnessed and in soft chart

## 2018-03-05 NOTE — Progress Notes (Signed)
Daily Progress Note    patient is awake alert feeling ok  No chest pain--had c-scope today  Some AVM noted -anemia from AVM  HTN stable  ESRD on HD  Hx of PAFIB now sinus rate stable  Hx of CAD s/p CABG   Diuretics per renal   Keep current meds on d/c    Echo --Summary  ??This is a limited echo to assess ejection fraction and RVSP.  ??Left ventricular systolic function is normal.  ??Ejection fraction is visually estimated at >50%.  ??Moderate left ventricular hypertrophy.  ??Right ventricular systolic pressure of 47 mm Hg consistent with moderate  ??pulmonary hypertension.  ??Mild aortic insufficiency seen.  ??Mitral annular calcification is present.  ??Mild mitral regurgitation is present.  ??Mild tricuspid regurgitation.  ??No evidence of pericardial effusion.  ??  ??  The patient had a bypass surgery done. ??Her last heart catheterization  was done in 2011. ??Left main is patent, LAD was 100% occluded, circ was  80% stenosis, RCA with 80% stenosis, SVG to RCA was patent, SVG to OM  was patent, LIMA to LAD was also patent at that time. ??The heart cath  was in 2011. ??Echo was done in 12/2017. ??LV function was preserved,  severe mitral annular calcification was noted. ??Diastolic dysfunction  present. ??The patient had an Holter done in 12/2017, sinus rhythm with  episodes of paroxysmal atrial fibrillation is noted and also  nonsustained ventricular tachycardia noted. ??Last stress test was done  in 12/2017. ??LV function was preserved. ??No ischemia was present.      Objective:   BP (!) 131/56    Pulse 72    Temp 97.8 ??F (36.6 ??C)    Resp 17    Ht 4\' 7"  (1.397 m)    Wt 80 lb 11 oz (36.6 kg)    SpO2 100%    BMI 18.75 kg/m??       Intake/Output Summary (Last 24 hours) at 03/05/2018 1323  Last data filed at 03/05/2018 1240  Gross per 24 hour   Intake 2460 ml   Output 200 ml   Net 2260 ml       Medications:   Scheduled Meds:  ??? [START ON 03/06/2018] famotidine  20 mg Oral Daily   ??? sodium chloride  250 mL Intravenous Once   ??? darbepoetin  alfa-polysorbate  60 mcg Subcutaneous Weekly - Thursday   ??? insulin lispro  0-6 Units Subcutaneous TID WC   ??? insulin lispro  0-3 Units Subcutaneous 2 times per day   ??? 0.9 % sodium chloride  250 mL Intravenous Once   ??? sodium chloride flush  10 mL Intravenous 2 times per day   ??? carvedilol  3.125 mg Oral BID   ??? docusate sodium  100 mg Oral BID   ??? fluticasone  1 spray Nasal Daily   ??? furosemide  40 mg Oral BID   ??? NIFEdipine  30 mg Oral BID   ??? b complex-C-E-zinc  1 tablet Oral Daily      Infusions:  ??? dextrose        PRN Meds:  polyvinyl alcohol, acetaminophen, oxyCODONE, sodium chloride flush, ondansetron, glucose, dextrose, glucagon (rDNA), dextrose       Physical Exam:  Vitals:    03/05/18 1057   BP: (!) 131/56   Pulse: 72   Resp: 17   Temp:    SpO2: 100%        General: awake alert   Chest: Nontender  Cardiac: sinus  Lungs:Clear to auscultation and percussion.  Abdomen: Soft, NT, ND, +BS  Extremities: no edema   Vascular:  Equal 2+ peripheral pulses.        Lab Data:  CBC:   Recent Labs     03/04/18  1725 03/05/18  0032 03/05/18  1018   HGB 7.4* 7.4* 8.0*   HCT 25.1* 24.3* 25.6*     BMP:   Recent Labs     03/03/18  0629 03/03/18  1002 03/04/18  0941 03/05/18  0344   NA 134* 133* 135 139   K 4.4 4.4 4.8 4.4   CL 102 101 103 104   CO2 19* 19* 21 22   PHOS 4.8  --  3.7 4.4   BUN 68* 69* 35* 39*   CREATININE 3.0* 3.0* 2.0* 2.5*     LIVER PROFILE: No results for input(s): AST, ALT, LIPASE, BILIDIR, BILITOT, ALKPHOS in the last 72 hours.    Invalid input(s):  AMYLASE,  ALB  PT/INR: No results for input(s): PROTIME, INR in the last 72 hours.  APTT: No results for input(s): APTT in the last 72 hours.  BNP:  No results for input(s): BNP in the last 72 hours.      Assessment:  Patient Active Problem List    Diagnosis Date Noted   ??? Anemia due to chronic kidney disease 02/27/2018   ??? Acute renal failure superimposed on stage 4 chronic kidney disease ([place]) 02/24/2018   ??? Uncontrolled type 2 diabetes mellitus ([place])  02/24/2018   ??? Acute on chronic blood loss anemia 02/24/2018   ??? Traumatic seroma of left lower leg ([place]) 09/19/2017   ??? AKI (acute kidney injury) ([place]) 09/16/2017   ??? ARF (acute renal failure) ([place]) 09/15/2017   ??? Essential hypertension 04/25/2017   ??? PAF (paroxysmal atrial fibrillation) ([place]) 04/25/2017   ??? Stage 4 chronic kidney disease ([place]) 04/25/2017   ??? Chest pain 04/28/2015   ??? Type II or unspecified type diabetes mellitus without mention of complication, not stated as uncontrolled    ??? Shortness of breath on exertion    ??? Anemia of chronic disease    ??? Kidney problem    ??? CAD (coronary artery disease)    ??? Arthritis        Scot Jun, MD 03/05/2018 1:23 PM

## 2018-03-05 NOTE — Anesthesia Post-Procedure Evaluation (Signed)
Department of Anesthesiology  Postprocedure Note    Patient: Judy Velasquez  MRN: [id number]  Birthdate: December 05, 1928  Date of evaluation: 03/05/2018  Time:  7:48 AM     Procedure Summary     Date:  03/05/18 Room / Location:  [facility] / [facility]    Anesthesia Start:  8115 Anesthesia Stop:  7262    Procedure:  COLONOSCOPY CONTROL HEMORRHAGE/ COLON POLYP REMOVAL COLD SNARE (N/A ) Diagnosis:  (ESRD)    Surgeon:  [name], MD Responsible Provider:  Tenna Child, MD    Anesthesia Type:  TIVA, MAC ASA Status:  4          Anesthesia Type: TIVA, MAC    Aldrete Phase I:      Aldrete Phase II:      Last vitals: Reviewed and per EMR flowsheets.       Anesthesia Post Evaluation    Patient location during evaluation: bedside  Patient participation: complete - patient participated  Level of consciousness: sleepy but conscious  Complications: no
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
## 2018-03-05 NOTE — Progress Notes (Signed)
REPORT GIVEN TO EMILY RN FLOOR NURSE

## 2018-03-05 NOTE — Plan of Care (Signed)
Problem: Infection:  Goal: Will remain free from infection  Description  Will remain free from infection  03/05/2018 1116 by Angelique Blonder, RN  Outcome: Ongoing  03/04/2018 2325 by Rae Mar, RN  Outcome: Ongoing     Problem: Safety:  Goal: Free from accidental physical injury  Description  Free from accidental physical injury  03/05/2018 1116 by Angelique Blonder, RN  Outcome: Ongoing  03/04/2018 2325 by Rae Mar, RN  Outcome: Ongoing  Goal: Free from intentional harm  Description  Free from intentional harm  03/05/2018 1116 by Angelique Blonder, RN  Outcome: Ongoing  03/04/2018 2325 by Rae Mar, RN  Outcome: Ongoing     Problem: Daily Care:  Goal: Daily care needs are met  Description  Daily care needs are met  03/05/2018 1116 by Angelique Blonder, RN  Outcome: Ongoing  03/04/2018 2325 by Rae Mar, RN  Outcome: Ongoing     Problem: Pain:  Goal: Patient's pain/discomfort is manageable  Description  Patient's pain/discomfort is manageable  03/05/2018 1116 by Angelique Blonder, RN  Outcome: Ongoing  03/04/2018 2325 by Rae Mar, RN  Outcome: Ongoing  Goal: Pain level will decrease  Description  Pain level will decrease  03/05/2018 1116 by Angelique Blonder, RN  Outcome: Ongoing  03/04/2018 2325 by Rae Mar, RN  Outcome: Ongoing  Goal: Control of acute pain  Description  Control of acute pain  03/05/2018 1116 by Angelique Blonder, RN  Outcome: Ongoing  03/04/2018 2325 by Rae Mar, RN  Outcome: Ongoing  Goal: Control of chronic pain  Description  Control of chronic pain  03/05/2018 1116 by Angelique Blonder, RN  Outcome: Ongoing  03/04/2018 2325 by Rae Mar, RN  Outcome: Ongoing     Problem: Discharge Planning:  Goal: Patients continuum of care needs are met  Description  Patients continuum of care needs are met  03/05/2018 1116 by Angelique Blonder, RN  Outcome: Ongoing  03/04/2018 2325 by Rae Mar, RN  Outcome: Ongoing     Problem: Falls - Risk of:  Goal: Will remain free from  falls  Description  Will remain free from falls  03/05/2018 1116 by Angelique Blonder, RN  Outcome: Ongoing  03/04/2018 2325 by Rae Mar, RN  Outcome: Ongoing  Goal: Absence of physical injury  Description  Absence of physical injury  03/05/2018 1116 by Angelique Blonder, RN  Outcome: Ongoing  03/04/2018 2325 by Rae Mar, RN  Outcome: Ongoing     Problem: Risk for Impaired Skin Integrity  Goal: Tissue integrity - skin and mucous membranes  Description  Structural intactness and normal physiological function of skin and  mucous membranes.  03/05/2018 1116 by Angelique Blonder, RN  Outcome: Ongoing  03/04/2018 2325 by Rae Mar, RN  Outcome: Ongoing     Problem: Nutrition  Goal: Optimal nutrition therapy  03/05/2018 1116 by Angelique Blonder, RN  Outcome: Ongoing  03/04/2018 2325 by Rae Mar, RN  Outcome: Ongoing

## 2018-03-05 NOTE — Progress Notes (Signed)
RENAL DOSE ADJUSTMENT MADE PER P/T PROTOCOL    PREVIOUS ORDER:  Pepcid 20 mg twice daily    CrCl cannot be calculated (Unknown ideal weight.).  Recent Labs     03/03/18  1002 03/04/18  0941 03/05/18  0344   BUN 69* 35* 39*   CREATININE 3.0* 2.0* 2.5*     NEW RENALLY ADJUSTED ORDER:  Pepcid 20 mg once daily    Dorethia Jeanmarie, [facility]  03/05/2018 11:21 AM  __________________________________________________

## 2018-03-05 NOTE — Op Note (Signed)
Judy Velasquez   03/05/2018    Procedure:  Colonoscopy  Endoscopist: Antony Haste L. Edwena Felty MD  Referring Physician: [name], MD  Preprocedure Dx: GI bleed- source unknown  Postprocedure CH:ENIDP AVM, sigmoid divertics, 3 mm rectal polyp, internal hemorrhoids  Medications: MAC-all meds per anesthesia provider    Technique:   The Olympus video colonoscope was introduced and passed to the cecum. The ileo-cecal valve and appendiceal opening were both identified. The valve was entered and the distal 5-10 cm of terminal ileum appeared normal. There was a small amount of old blood coating the mucosa, but no actively bleeding site was seen despite a careful search and good visualization.The scope was then gradually withdrawn and good visualization was obtained in a well-prepared patient. There was a possible AVM in the cecum and this was cauterized with the APC. Mild sigmoid diverticulosis and small internal hemorrhoids were also seen. In addition there was a 3 mm polyp at 10 cm that was removed with the cold snare. The patient tolerated the procedure well.    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 Edwena Felty MD

## 2018-03-06 LAB — POCT GLUCOSE
POC Glucose: 233 MG/DL — ABNORMAL HIGH (ref 70–99)
POC Glucose: 121 MG/DL — ABNORMAL HIGH (ref 70–99)
POC Glucose: 204 MG/DL — ABNORMAL HIGH (ref 70–99)

## 2018-03-06 LAB — HEMOGLOBIN AND HEMATOCRIT
Hematocrit: 22.8 % — ABNORMAL LOW (ref 37–47)
Hematocrit: 23.1 % — ABNORMAL LOW (ref 37–47)
Hematocrit: 33.3 % — ABNORMAL LOW (ref 37–47)
Hemoglobin: 6.5 GM/DL — CL (ref 12.5–16.0)
Hemoglobin: 7.1 GM/DL — ABNORMAL LOW (ref 12.5–16.0)
Hemoglobin: 10.6 GM/DL — ABNORMAL LOW (ref 12.5–16.0)

## 2018-03-06 LAB — CRITICAL CARE PANEL
Anion Gap: 13 (ref 4–16)
BUN: 43 MG/DL — ABNORMAL HIGH (ref 6–23)
CO2: 19 MMOL/L — ABNORMAL LOW (ref 21–32)
Calcium: 7.7 MG/DL — ABNORMAL LOW (ref 8.3–10.6)
Chloride: 105 mMol/L (ref 99–110)
Creatinine: 2.9 MG/DL — ABNORMAL HIGH (ref 0.6–1.1)
GFR African American: 19 mL/min/{1.73_m2} — ABNORMAL LOW (ref >60)
GFR Non-African American: 15 mL/min/{1.73_m2} — ABNORMAL LOW (ref >60)
Glucose: 163 MG/DL — ABNORMAL HIGH (ref 70–99)
Magnesium: 2.5 mg/dl — ABNORMAL HIGH (ref 1.8–2.4)
Phosphorus: 4.9 MG/DL (ref 2.5–4.9)
Potassium: 4.1 MMOL/L (ref 3.5–5.1)
Sodium: 137 MMOL/L (ref 135–145)

## 2018-03-06 MED ORDER — LIDOCAINE HCL (PF) 2 % IJ SOLN
2 % | INTRAMUSCULAR | Status: DC | PRN
Start: 2018-03-06 — End: 2018-03-06
  Administered 2018-03-06: 19:00:00 50 via INTRAVENOUS

## 2018-03-06 MED ORDER — SODIUM CHLORIDE 0.9 % IV SOLN
0.9 | INTRAVENOUS | Status: AC
Start: 2018-03-06 — End: 2018-03-06

## 2018-03-06 MED ORDER — SODIUM CHLORIDE 0.9 % IV BOLUS
0.9 % | Freq: Once | INTRAVENOUS | Status: AC
Start: 2018-03-06 — End: 2018-03-06
  Administered 2018-03-06: 12:00:00 250 mL via INTRAVENOUS

## 2018-03-06 MED ORDER — STERILE WATER FOR INJECTION IJ SOLN
INTRAMUSCULAR | Status: AC
Start: 2018-03-06 — End: 2018-03-06

## 2018-03-06 MED ORDER — GLUCAGON HCL RDNA (DIAGNOSTIC) 1 MG IJ SOLR
1 | INTRAMUSCULAR | Status: AC
Start: 2018-03-06 — End: 2018-03-06

## 2018-03-06 MED ORDER — PANTOPRAZOLE SODIUM 40 MG PO TBEC
40 MG | Freq: Every day | ORAL | Status: DC
Start: 2018-03-06 — End: 2018-03-08
  Administered 2018-03-06 – 2018-03-08 (×3): 40 mg via ORAL

## 2018-03-06 MED ORDER — GLUCAGON HCL RDNA (DIAGNOSTIC) 1 MG IJ SOLR
1 MG | INTRAMUSCULAR | Status: DC | PRN
Start: 2018-03-06 — End: 2018-03-06
  Administered 2018-03-06 (×2): .5 via INTRAVENOUS

## 2018-03-06 MED ORDER — HEPARIN SODIUM (PORCINE) 1000 UNIT/ML IJ SOLN
1000 UNIT/ML | Freq: Once | INTRAMUSCULAR | Status: AC
Start: 2018-03-06 — End: 2018-03-06
  Administered 2018-03-06: 17:00:00 3500 [IU]

## 2018-03-06 MED ORDER — SODIUM CHLORIDE 0.9 % IV SOLN
0.9 % | INTRAVENOUS | Status: DC | PRN
Start: 2018-03-06 — End: 2018-03-06
  Administered 2018-03-06: 19:00:00 via INTRAVENOUS

## 2018-03-06 MED ORDER — PROPOFOL 200 MG/20ML IV EMUL
200 MG/20ML | INTRAVENOUS | Status: DC | PRN
Start: 2018-03-06 — End: 2018-03-06
  Administered 2018-03-06: 19:00:00 50 via INTRAVENOUS
  Administered 2018-03-06 (×8): 20 via INTRAVENOUS

## 2018-03-06 MED FILL — FLUTICASONE PROPIONATE 50 MCG/ACT NA SUSP: 50 MCG/ACT | NASAL | Qty: 16

## 2018-03-06 MED FILL — NIFEDIPINE ER OSMOTIC RELEASE 30 MG PO TB24: 30 mg | ORAL | Qty: 1

## 2018-03-06 MED FILL — PANTOPRAZOLE SODIUM 40 MG PO TBEC: 40 mg | ORAL | Qty: 1

## 2018-03-06 MED FILL — DOK 100 MG PO CAPS: 100 mg | ORAL | Qty: 1

## 2018-03-06 MED FILL — STERILE WATER FOR INJECTION IJ SOLN: INTRAMUSCULAR | Qty: 10

## 2018-03-06 MED FILL — GLUCAGEN DIAGNOSTIC 1 MG IJ SOLR: 1 mg | INTRAMUSCULAR | Qty: 1

## 2018-03-06 MED FILL — FUROSEMIDE 40 MG PO TABS: 40 mg | ORAL | Qty: 1

## 2018-03-06 MED FILL — SODIUM CHLORIDE 0.9 % IV SOLN: 0.9 % | INTRAVENOUS | Qty: 250

## 2018-03-06 MED FILL — CARVEDILOL 3.125 MG PO TABS: 3.125 mg | ORAL | Qty: 1

## 2018-03-06 MED FILL — HEPARIN SODIUM (PORCINE) 1000 UNIT/ML IJ SOLN: 1000 [IU]/mL | INTRAMUSCULAR | Qty: 4

## 2018-03-06 NOTE — Progress Notes (Signed)
Hospitalist Dr. Thedore Mins and Dr. Wallis Bamberg alerted to patient hgb of 6.5. Type and cross and 1 unit of blood ordered to be given before dialysis.

## 2018-03-06 NOTE — Progress Notes (Signed)
Hospitalist Progress Note      Name:  Judy Velasquez DOB/Age/Sex: [birth year]  (84 y.o. female)   MRN & CSN:  [id number] & [id number] Admission Date/Time: 02/24/2018  9:52 AM   Location:  3001/3001-A PCP: Judy Der, MD         Hospital Day: 11    Assessment and Plan:   Judy Velasquez is a 84 y.o.  female  who presents with Acute renal failure superimposed on stage 4 chronic kidney disease ([place])          DC plan Home with [facility]  HD then Push Enteroscopy today        Acute on chronic anemia secondary to blood loss  Likely UGIB  Hgb 6.8  --> 7.7-- 6.5   one unit during HD   H/H Q8h  Occult blood +Ve  GI consult--> EGD negative.   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    AKI on CKD stage 4  Tunneled cath placed --> HD  Op dialysis next week  Daily weights  I/O  Nephrology on board  Per CM: Pt has been approved for dialysis at [facility] in [place].  ??  DM   Cont sliding scale insulin  CB monitoring  ??  CAD  PAF  hyperlipidemia  hypertension  Cont home meds  Cardio consult  ??    Chronic treatment continued per home medications unless  contraindicated by above plan and assessment.    The above assessment/plan has been explained to the patient, who indicated understanding.      Diet Diet NPO Effective Now   DVT Prophylaxis []  Lovenox, []   Heparin, [x]  SCDs, [] No VTE prophylaxis, patient ambulating   GI Prophylaxis []  PPI, [x]  H2 Blocker, []  No GI prophylaxis, patient is receiving diet/Tube Feeds   Code Status Full Code             History of Present Illness:     Chief Complaint: Acute renal failure superimposed on stage 4 chronic kidney disease ([place])    Patient was seen and examined at bedside today. Patient sitting up comfortably in bed in NAD.   Ten point ROS reviewed negative, unless as noted above    Objective:       Intake/Output Summary (Last 24 hours) at 03/06/2018  1031  Last data filed at 03/05/2018 1240  Gross per 24 hour   Intake 480 ml   Output --   Net 480 ml      Vitals:   Vitals:    03/06/18 1023   BP: (!) 147/39   Pulse: 63   Resp: 17   Temp:    SpO2: 98%     Physical Exam:      GEN Awake female, sitting upright in bed in no apparent distress. Appears given age.  EYES Pupils are equally round.  No scleral erythema, discharge, or conjunctivitis.  HENT Mucous membranes are moist.   RESP Clear to auscultation, no wheezes, rales or rhonchi.    CARDIO/VASC S1/S2 auscultated. No murmurs  GI Abdomen is soft without significant tenderness, masses, or guarding.   MSK No gross joint deformities. Spontaneous movement of all extremities  SKIN Normal coloration, warm, dry.  NEURO Grossly normal  PSYCH Awake, alert    Medications:   Medications:   ??? sodium chloride  250 mL Intravenous Once   ??? famotidine  20 mg Oral Daily   ??? sodium chloride  250 mL Intravenous Once   ??? darbepoetin alfa-polysorbate  60 mcg Subcutaneous Weekly - Thursday   ??? insulin lispro  0-6 Units Subcutaneous TID WC   ??? insulin lispro  0-3 Units Subcutaneous 2 times per day   ??? 0.9 % sodium chloride  250 mL Intravenous Once   ??? sodium chloride flush  10 mL Intravenous 2 times per day   ??? carvedilol  3.125 mg Oral BID   ??? docusate sodium  100 mg Oral BID   ??? fluticasone  1 spray Nasal Daily   ??? furosemide  40 mg Oral BID   ??? NIFEdipine  30 mg Oral BID   ??? b complex-C-E-zinc  1 tablet Oral Daily      Infusions:   ??? dextrose       PRN Meds:     polyvinyl alcohol 1 drop PRN   acetaminophen 650 mg Q4H PRN   oxyCODONE 2.5 mg Q4H PRN   sodium chloride flush 10 mL PRN   ondansetron 4 mg Q6H PRN   glucose 15 g PRN   dextrose 12.5 g PRN   glucagon (rDNA) 1 mg PRN   dextrose 100 mL/hr PRN         Electronically signed by Judy Mech, MD on 03/06/2018 at 10:31 AM

## 2018-03-06 NOTE — Plan of Care (Signed)
Nutrition Problem: Inadequate oral intake  Intervention: Food and/or Nutrient Delivery: Continue NPO  Nutritional Goals: Pt will tolerate diet advancement within 24-48 hrs

## 2018-03-06 NOTE — Progress Notes (Signed)
Nutrition Assessment    Type and Reason for Visit: Reassess    Nutrition Recommendations:   ?? Resume least restrictive diet as able post procedure, No Added Salt?  ?? Begin renal oral nutrition supplement bid, once diet advanced  ?? Nutrition focused physical exam for malnutrition at reassess    Nutrition Assessment: High nutrition risk continues with poor intake dos. Did consume 76% to all of lunch yesterday per Flowsheets. NPO for enteroscopy today noted. Pt off unit for HD during visit today.    Malnutrition Assessment:  ?? Malnutrition Status: (Malnutrition likely, unable to perform physical assessment)    Nutrition Risk Level: High    Nutrient Needs:  ?? Estimated Daily Total Kcal: 1140-1330 (30-35 kcal/kg)  ?? Estimated Daily Protein (g): 38-76 (1-2 g/kg)  ?? Estimated Daily Total Fluid (ml/day): 1140-1330 (1 ml/kcal)    Nutrition Diagnosis:   ?? Problem: Inadequate oral intake  ?? Etiology: related to Renal dysfunction, Alteration in GI function    ??? Signs and symptoms:  as evidenced by NPO status due to medical condition, Diet history of poor intake, Known losses from dialysis    Objective Information:  ?? Wound Type: None  ?? Current Nutrition Therapies:  ?? Oral Diet Orders: NPO   ?? Oral Diet intake: 0%, 76-100%  ?? Oral Nutrition Supplement (ONS) Orders: None  ?? Anthropometric Measures:  ?? Ht: 4\' 7"  (139.7 cm)   ?? Current Body Wt: 79 lb 9.4 oz (36.1 kg)  ?? Admission Body Wt: 80 lb 11 oz (36.6 kg)  ?? Usual Body Wt: 94 lb 3.2 oz (42.7 kg)(05/13/17)  ?? % Weight Change:   -11 in 10 months  ?? Ideal Body Wt: 92 lb (41.7 kg), % Ideal Body 86  ?? BMI Classification: BMI <18.5 Underweight(18.5)    Nutrition Interventions:   Continue NPO  Continued Inpatient Monitoring, Education not appropriate at this time, Coordination of Care    Nutrition Evaluation:   ?? Evaluation: No progress toward goals   ?? Goals: Pt will tolerate diet advancement within 24-48 hrs      ?? Monitoring: Nutrition Progression, Meal Intake, Supplement  Intake, Diet Tolerance, Weight, Pertinent Labs      Electronically signed by Helane Gunther, RD, LD on 03/06/18 at 11:57 AM    Contact Number: [phone]

## 2018-03-06 NOTE — Progress Notes (Addendum)
Hemoglobin dropped to 6.5- I was not notified  Suggest:   1) transfuse one unit   2) push enteroscopy early this afternoon

## 2018-03-06 NOTE — Progress Notes (Signed)
Called for report from RN C. Massie at [facility]. Patient A&O x4, no complaints. Patient have 1 of 2 units of PRBC started at bedside and then to finish transfusion during dialysis. Patient placed on transport @ [phone]. Transport Dee to room @0913  to report that patient was sitting in chair with blood infusing and that Section would be bringing patient down. Transport canceled. Spoke with RN Juanetta Beets again @0953 . [employer] getting ready to find PCT and bring patient down to dialysis room. Patient arrived to unit at 1012, 1 unit of PRBC Infusing.

## 2018-03-06 NOTE — Progress Notes (Signed)
This nurse mistakenly released blood in the blood admin flowsheet before ready to transfuse. Will pass along to days that it has been released, but not transfused yet.
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
## 2018-03-06 NOTE — Progress Notes (Signed)
[facility] [address], [place] [address]  Phone: [phone]  Office Hours: 8:30AM - 4:30PM  Monday - Friday      Nephrology  Dialysis Note        PROCEDURE:  Patient seen  during hemodialysis      PHYSICIAN:  PK      INDICATION:  Acute renal failure      RX:  See dialysis flowsheet for specifics on access, blood flow rate, dialysate baths, duration of dialysis, anticoagulation and other technical information.      COMMENTS:  Hb dropped again  Transfusing 2 units   for enteroscopy

## 2018-03-06 NOTE — H&P (Signed)
I have examined the patient within 24 hours  before the procedure and there is no change in the previous history and physical exam,which has been reviewed.There is no history of sleep apnea, snoring, or stridor.  There has been no  previous adverse experience with sedation/anesthesia. There is no increased risk for aspiration of gastric contents.  The patient has been instructed that all resuscitative measures (during the operative and immediate perioperative period) will be instituted in the unlikely event that they will be needed.The patient has no pertinent past surgical or FH other than listed in the original H&P.    ASA Class: 3  AIRWAY Class: 1    Consent form signed, witnessed and in soft chart

## 2018-03-06 NOTE — Progress Notes (Signed)
Report given to C. Juanetta Beets, Therapist, sports. Patient tolerated HD treatment well. 0 L removed. Patient educated on blood transfusion, understanding verbalized by patient. No meds given during HD treatment. 1 unit of PRBC given during treatment with post H&H drawn and sent to lab. Both ports on HD CVC heparin locked and capped per policy. Next HD is Monday.    Patient Name: Judy Velasquez  Patient DOB: 1929-08-24  MRN: [id number]     Acct: [id number]  Date of Admission: 02/24/2018  Room/Bed: 3001/3001-A  Code Status:  Full Code  Allergies:   Allergies   Allergen Reactions   ??? Celebrex [Celecoxib] Shortness Of Breath, Swelling and Anaphylaxis     "Swelling Of The Esophagus"   ??? Fish Allergy Anaphylaxis   ??? Lisinopril Other (See Comments)     Dry Mouth   ??? Neurontin [Gabapentin] Other (See Comments)     Dizziness   ??? Norvasc [Amlodipine Besylate] Other (See Comments)     Edema Of Legs   ??? Other      "Allergic To Certain Seafoods Causing Headaches, Difficulty Breathing"   ??? Shellfish Allergy Anaphylaxis   ??? Ciprofloxacin Rash   ??? Lipitor Other (See Comments)     Ache   ??? Losartan      Per nephrologist     Diagnosis:    Patient Active Problem List   Diagnosis   ??? Type II or unspecified type diabetes mellitus without mention of complication, not stated as uncontrolled   ??? Shortness of breath on exertion   ??? Anemia of chronic disease   ??? Kidney problem   ??? CAD (coronary artery disease)   ??? Arthritis   ??? Chest pain   ??? Essential hypertension   ??? PAF (paroxysmal atrial fibrillation) ([place])   ??? Stage 4 chronic kidney disease ([place])   ??? ARF (acute renal failure) ([place])   ??? AKI (acute kidney injury) ([place])   ??? Traumatic seroma of left lower leg (HCC)   ??? Acute renal failure superimposed on stage 4 chronic kidney disease ([place])   ??? Uncontrolled type 2 diabetes mellitus ([place])   ??? Acute on chronic blood loss anemia   ??? Anemia due to chronic kidney disease         Treatment:  Hemodilaysis 2:1  Routine  Location: Acute Room    Diabetic: Yes  NPO:  Yes  Isolation Precautions: Dialysis     Consent for Treatment Verified: Yes  Blood Consent Verified: Yes     Safety Verified: Identify (I), Consent (C), Equipment (E), HepB Status (B), Orders Complete (O), Access Verified (A) and Timeliness (T)  Time out performed prior to access at 1009.     Report Received from Primary RN: Yes Time: 48  Primary RN (First Initial, Last Name, Title): Carmie End  Incapacitated Nurse Education Completed: Not Applicable     HBsAg ONLY:  Date Drawn: February 24, 2018       Results: Negative  HBsAb:  Date Drawn:  February 24, 2018       Results: Susceptible <10    Order     Citrate Bath  Ca+ (Calcium): (3.5) (03/03/18 1002)  K+ (Potassium): 4 (03/03/18 1002)  Bicarb: 30 (03/03/18 1002)  Na+ Modeling: Not Applicable  Dialyzer: P329  Dialysate Temperature (C):  36  Blood Flow Rate (BFR):  250   Dialysate Flow Rate (DFR):   700        Treatment  Treatment Number: 4  Time On: 5188  Time Off: 1252  Treatment Goal: 0  Weight: 79 lb 9.4 oz (36.1 kg) (03/06/18 1306)     Access to be Utilized   Access: Tunneled Catheter  Location: Internal Jugular  Side: Right   Needle gauge:  Not Applicable  + Bruit/Thrill: Not Applicable    First Use X-ray Verified: Yes  OK to use line order: Yes    Site Assessment:  Signs and Symptoms of Infection/Inflammation: None  If yes: Drainage  Dressing: Soiled  Site Prep: Medical Aseptic Technique  Dressing Changed this Treatment: Yes  If yes, by whom: [facility] RN  Date of Last Dressing Change: March 03, 2018  Antimicrobial Patch in place?: Yes  Red Alcohol Caps in place?: Yes  Gauze Dressing?: No  Non Dialysis Use?: No  Comment:    Flows: Good, Patent  If access problem, who was notified:     Pre and Post-Assessment  Patient Vitals for the past 8 hrs:   Level of Consciousness Oriented X Heart Rhythm Resp Respiratory Quality/Effort O2 Device Bilateral Breath Sounds Skin Color Skin Condition/Temp Abdomen Inspection Bowel Sounds (All Quadrants) Edema Comments Pain Level   03/06/18  0818 -- -- -- 16 -- -- -- -- -- -- -- -- -- --   03/06/18 0834 -- -- -- 20 -- -- -- -- -- -- -- -- -- --   03/06/18 1009 0 4 Irregular 17 Unlabored None (Room air) Diminished Pale Dry;Warm Flat;Soft Active None Consent verified, pre-treatment 0   03/06/18 1018 -- -- -- 27 -- -- -- -- -- -- -- -- -- --   03/06/18 1023 -- -- -- 17 -- -- -- -- -- -- -- -- -- --   03/06/18 1038 -- -- -- 15 -- -- -- -- -- -- -- -- -- --   03/06/18 1054 -- -- -- 12 -- -- -- -- -- -- -- -- -- --   03/06/18 1100 -- -- -- 14 -- -- -- -- -- -- -- -- -- --   03/06/18 1107 -- -- -- 19 -- -- -- -- -- -- -- -- -- --   03/06/18 1119 -- -- -- 13 -- -- -- -- -- -- -- -- -- --   03/06/18 1136 -- -- -- 16 -- -- -- -- -- -- -- -- -- --   03/06/18 1157 -- -- -- 12 -- -- -- -- -- -- -- -- -- --   03/06/18 1208 -- -- -- 15 -- -- -- -- -- -- -- -- -- --   03/06/18 1225 -- -- -- 15 -- -- -- -- -- -- -- -- -- --   03/06/18 1231 -- -- -- 22 -- -- -- -- -- -- -- -- -- --   03/06/18 1239 -- -- -- 15 -- -- -- -- -- -- -- -- -- --   03/06/18 1252 -- -- -- 13 -- -- -- -- -- -- -- -- -- --   03/06/18 1306 0 4 Irregular 18 Unlabored None (Room air) Diminished Appropriate for ethnicity Dry;Warm Flat;Soft Active None -- 0     Labs  Recent Labs     03/05/18  1804 03/05/18  2330 03/06/18  0850   HGB 7.2* 6.5  HGB CALLED TO CASEY SCOTT,RN 6/14 [address]Y [address]  * 7.1*   HCT 25.5* 22.8* 23.1*  Recent Labs     03/04/18  0941 03/05/18  0344 03/06/18  0338   NA 135 139 137   K 4.8 4.4 4.1   CL 103 104 105   CO2 21 22 19*   BUN 35* 39* 43*   CREATININE 2.0* 2.5* 2.9*   GLUCOSE 96 97 163*     IV Drips and Rate/Dose  ??? dextrose        Safety - Before each treatment:   Dialysis Machine No.: [id number]  RO Machine No.: [id number]  Dialyzer Lot No.: [id number]  RO Machine Log Sheet Completed: Yes  Machine Alarm Self Test: Passed;Completed (03/06/18 0855)  Machine Autotest: Completed, Games developer: Tested,  Proper Function, pH Reading  Extracorporeal Circuit Tested for Integrity: Yes  Machine Conductivity: 14.1  Manual Conductivity: 13.7  Machine Ph: 7.5  Bicarbonate Concentrate Lot No.: [id number]  Acid Concentrate Lot No.: [id number]  Manual Ph: 7.5  Bleach Test (Neg): Yes  Bath Temperature: 95 ??F (35 ??C)  Tubing Lot#: [id number]  Conductivity Meter Serial #: [id number]  All Connections Secure?: Yes  Venous Parameters Set?: Yes  Arterial Parameters Set?: Yes  Saline Line Double Clamped?: Yes  Air Foam Detector Engaged?: Yes  Machine Functioning Alarm Free? Yes  Prime Given: n/a    Chlorine Testing - Before each treatment and every 4 hours:   1st check: less than 0.1 ppm at (time): 0641  2nd check: less than 0.1 ppm at (time): 1004  3rd check: Not Applicable  (if greater than 0.1 ppm, then check every 30 minutes from secondary)     Vital Signs  Patient Vitals for the past 6 hrs:   BP Pulse Blood Flow Rate (ml/min) Ultrafiltration Rate (ml/hr) Ultrafiltration Total Temp Arterial Pressure (mmHg) Venous Pressure (mmHg) TMP Hemodialysis Conductivity DFR Comments Access Visible   03/06/18 0818 (!) 150/42 55 -- -- -- 98.5 ??F (36.9 ??C) -- -- -- -- -- -- --   03/06/18 0834 (!) 153/51 59 -- -- -- 98 ??F (36.7 ??C) -- -- -- -- -- -- --   03/06/18 1009 (!) 166/46 56 -- -- -- 97.9 ??F (36.6 ??C) -- -- -- -- -- -- --   03/06/18 1018 (!) 147/39 53 200 ml/min 400 ml/hr 0 ml -- -50 mmHg 60 50 13.7 700 Treatment initiated, MD notified Yes   03/06/18 1023 (!) 147/39 63 250 ml/min 400 ml/hr 40 ml -- -70 mmHg 70 50 -- 700 alert, calm Yes   03/06/18 1038 (!) 154/48 52 250 ml/min 400 ml/hr 138 ml -- -70 mmHg 80 50 -- 700 watching tv Yes   03/06/18 1054 (!) 170/49 50 250 ml/min 400 ml/hr 249 ml -- -120 mmHg 70 50 -- 700 no distress noted Yes   03/06/18 1100 (!) 172/47 53 -- -- -- 97.5 ??F (36.4 ??C) -- -- -- -- -- -- --   03/06/18 1107 (!) 158/64 62 250 ml/min 400 ml/hr 331 ml -- -110 mmHg 70 50 -- 700 Dr. Maretta Bees at bedside Yes   03/06/18 1119 (!) 169/54 54  250 ml/min 400 ml/hr 420 ml -- -120 mmHg 80 50 -- 700 2nd unit of blood infusing Yes   03/06/18 1136 (!) 193/59 55 250 ml/min 400 ml/hr 521 ml -- -100 mmHg 80 50 -- 700 no complaints Yes   03/06/18 1157 (!) 200/49 57 250 ml/min 400 ml/hr 688 ml -- -110 mmHg 80 50 -- 700 watching tv, no change Yes   03/06/18 1208 (!) 208/59 55 250  ml/min 400 ml/hr 767 ml -- -120 mmHg 80 50 -- 700 alert, talking Yes   03/06/18 1225 (!) 198/64 59 250 ml/min 400 ml/hr 891 ml -- -110 mmHg 90 40 -- 700 patient repositioned Yes   03/06/18 1231 -- 57 -- -- -- -- -- -- -- -- -- -- --   03/06/18 1239 (!) 160/65 62 250 ml/min 400 ml/hr 932 ml -- -110 mmHg 200 40 -- 700 watching tv Yes   03/06/18 1252 (!) 182/50 57 -- -- 1087 ml -- -- -- -- -- -- treatment completed, blood returned Yes   03/06/18 1306 (!) 203/57 60 -- -- -- 97.7 ??F (36.5 ??C) -- -- -- -- -- -- --     Post-Dialysis  Arterial Catheter Locking Solution: Heparin (1000units:27ml)    Volume (ml): 1.7  Venous Catheter Locking Solution: Heparin (1000units:70ml)    Volume (ml): 1.8  Post-Treatment Procedures: Blood returned, Catheter capped, clamped and heparinized x 2 ports  Machine Disinfection Process: Financial controller  Rinseback Volume (ml): 300 ml  Total Liters Processed (l/min): 36.1 l/min  Dialyzer Clearance: Heavily streaked  Duration of Treatment (minutes): 150 minutes  Heparin amount administered during treatment (units): 0 units  Hemodialysis Intake (ml): 1050 ml  Hemodialysis Output (ml): 1087 ml  NET Removed (ml): 37 ml  Tolerated Treatment: Good  Patient Response to Treatment: no complaints  Physician Notified?: No       Provider Notification        Handoff complete and report given to Primary RN?: Yes Time:  44   Primary RN (First Initial, Last Name, Title):  Carmie End, RN     Education  Person Educated: Patient   Knowledge Base: Minimal  Barriers to Learning?: None  Preferred method of Learning: Oral  Topic(s): Access Care, Fluid Management and Procedural    Teaching Tools: Explanation   Response to Education: Verbalized Understanding     Electronically signed by Kathrene Alu, RN on 03/06/2018 at 1:27 PM

## 2018-03-06 NOTE — Anesthesia Pre-Procedure Evaluation (Signed)
Department of Anesthesiology  Preprocedure Note       Name:  Judy Velasquez   Age:  84 y.o.  DOB:  Feb 04, 1929                                          MRN:  [id number]         Date:  03/06/2018      Surgeon: Juliann Mule):  [name], MD    Procedure: ENTEROSCOPY (N/A )    Medications prior to admission:   Prior to Admission medications    Medication Sig Start Date End Date Taking? Authorizing Provider   iron polysaccharides (FERREX 150) 150 MG capsule Take 150 mg by mouth 12/22/17  Yes Historical Provider, MD   acetaminophen (TYLENOL) 500 MG tablet Take 1,000 mg by mouth    Historical Provider, MD   metolazone (ZAROXOLYN) 2.5 MG tablet  02/02/18   Historical Provider, MD   ondansetron (ZOFRAN-ODT) 8 MG TBDP disintegrating tablet  01/26/18   Historical Provider, MD   polyethylene glycol (GLYCOLAX) packet Take 17 g by mouth    Historical Provider, MD   HYDROcodone-acetaminophen (NORCO) 5-325 MG per tablet Take 1 tablet by mouth every 6 hours as needed for Pain.    Historical Provider, MD   furosemide (LASIX) 40 MG tablet Take 40 mg by mouth 2 times daily    Historical Provider, MD   isosorbide dinitrate (ISORDIL) 30 MG tablet Take 1 tablet by mouth 3 times daily 09/26/17   Vernie Ammons, MD   hydrALAZINE (APRESOLINE) 100 MG tablet Take 1 tablet by mouth 3 times daily 09/26/17   Vernie Ammons, MD   carvedilol (COREG) 3.125 MG tablet Take 1 tablet by mouth 2 times daily 09/26/17   Vernie Ammons, MD   NIFEdipine (ADALAT CC) 30 MG extended release tablet Take 1 tablet by mouth 2 times daily 09/26/17   Vernie Ammons, MD   docusate sodium (PHILLIPS STOOL SOFTENER) 100 MG capsule Take 1 capsule by mouth 2 times daily 09/26/17   Vernie Ammons, MD   B Complex-Biotin-FA (SUPER B-50 B COMPLEX PO) Take by mouth    Historical Provider, MD   fluticasone (FLONASE) 50 MCG/ACT nasal spray 1 spray by Nasal route daily    Historical Provider, MD       Current medications:    Current Facility-Administered Medications   Medication Dose Route Frequency  Provider Last Rate Last Dose   ??? 0.9 % sodium chloride bolus  250 mL Intravenous Once Meryle Ready, MD 20 mL/hr at 03/06/18 0821 250 mL at 03/06/18 0821   ??? heparin (porcine) injection 3,500 Units  3,500 Units Intercatheter Once in dialysis Claretta Fraise, MD       ??? famotidine (PEPCID) tablet 20 mg  20 mg Oral Daily Bailey Mech, MD   Stopped at 03/06/18 1005   ??? polyvinyl alcohol (LIQUIFILM TEARS) 1.4 % ophthalmic solution 1 drop  1 drop Both Eyes PRN Bailey Mech, MD   1 drop at 03/05/18 1315   ??? 0.9 % sodium chloride bolus  250 mL Intravenous Once Lester Kinsman, MD 20 mL/hr at 03/06/18 1403 250 mL at 03/06/18 1403   ??? acetaminophen (TYLENOL) tablet 650 mg  650 mg Oral Q4H PRN Renard Matter, MD   650 mg at 03/02/18 2055   ??? oxyCODONE (ROXICODONE) immediate release tablet 2.5 mg  2.5 mg Oral Q4H PRN Ragia Aly,  MD   2.5 mg at 03/03/18 1504   ??? darbepoetin alfa-polysorbate (ARANESP) injection 60 mcg  60 mcg Subcutaneous Weekly - Thursday Claretta Fraise, MD   60 mcg at 03/05/18 1241   ??? insulin lispro (HUMALOG) injection vial 0-6 Units  0-6 Units Subcutaneous TID WC [name], MD   1 Units at 03/03/18 1653   ??? insulin lispro (HUMALOG) injection vial 0-3 Units  0-3 Units Subcutaneous 2 times per day [name], MD   1 Units at 03/01/18 2355   ??? 0.9 % sodium chloride infusion 250 mL  250 mL Intravenous Once Claretta Fraise, MD       ??? sodium chloride flush 0.9 % injection 10 mL  10 mL Intravenous 2 times per day Clydie Braun, APRN - CNP   10 mL at 03/05/18 1050   ??? sodium chloride flush 0.9 % injection 10 mL  10 mL Intravenous PRN Clydie Braun, APRN - CNP       ??? ondansetron (ZOFRAN) injection 4 mg  4 mg Intravenous Q6H PRN Clydie Braun, APRN - CNP   4 mg at 03/03/18 1735   ??? carvedilol (COREG) tablet 3.125 mg  3.125 mg Oral BID Clydie Braun, APRN - CNP   Stopped at 03/06/18 1005   ??? docusate sodium (COLACE) capsule 100 mg  100 mg Oral BID Clydie Braun, APRN - CNP   Stopped at 03/06/18 1005   ??? fluticasone  (FLONASE) 50 MCG/ACT nasal spray 1 spray  1 spray Nasal Daily Angelita Ramos, APRN - CNP   1 spray at 03/05/18 1051   ??? furosemide (LASIX) tablet 40 mg  40 mg Oral BID Clydie Braun, APRN - CNP   Stopped at 03/06/18 1006   ??? NIFEdipine (PROCARDIA XL) extended release tablet 30 mg  30 mg Oral BID Clydie Braun, APRN - CNP   Stopped at 03/06/18 1007   ??? b complex-C-E-zinc (STRESS FORMULA W/ ZINC) 1 tablet  1 tablet Oral Daily Clydie Braun, APRN - CNP   Stopped at 03/06/18 1005   ??? glucose (GLUTOSE) 40 % oral gel 15 g  15 g Oral PRN Angelita Ramos, APRN - CNP       ??? dextrose 50 % IV solution  12.5 g Intravenous PRN Clydie Braun, APRN - CNP       ??? glucagon (rDNA) injection 1 mg  1 mg Intramuscular PRN Angelita Ramos, APRN - CNP       ??? dextrose 5 % solution  100 mL/hr Intravenous PRN Clydie Braun, APRN - CNP           Allergies:    Allergies   Allergen Reactions   ??? Celebrex [Celecoxib] Shortness Of Breath, Swelling and Anaphylaxis     "Swelling Of The Esophagus"   ??? Fish Allergy Anaphylaxis   ??? Lisinopril Other (See Comments)     Dry Mouth   ??? Neurontin [Gabapentin] Other (See Comments)     Dizziness   ??? Norvasc [Amlodipine Besylate] Other (See Comments)     Edema Of Legs   ??? Other      "Allergic To Certain Seafoods Causing Headaches, Difficulty Breathing"   ??? Shellfish Allergy Anaphylaxis   ??? Ciprofloxacin Rash   ??? Lipitor Other (See Comments)     Ache   ??? Losartan      Per nephrologist       Problem List:    Patient Active Problem List   Diagnosis Code   ??? Type II or unspecified type  diabetes mellitus without mention of complication, not stated as uncontrolled E11.9   ??? Shortness of breath on exertion R06.02   ??? Anemia of chronic disease D63.8   ??? Kidney problem N28.9   ??? CAD (coronary artery disease) I25.10   ??? Arthritis M19.90   ??? Chest pain R07.9   ??? Essential hypertension I10   ??? PAF (paroxysmal atrial fibrillation) (HCC) I48.0   ??? Stage 4 chronic kidney disease (HCC) N18.4   ??? ARF (acute renal failure)  (HCC) N17.9   ??? AKI (acute kidney injury) ([place]) N17.9   ??? Traumatic seroma of left lower leg (HCC) T79.2XXA   ??? Acute renal failure superimposed on stage 4 chronic kidney disease (HCC) N17.9, N18.4   ??? Uncontrolled type 2 diabetes mellitus (HCC) E11.65   ??? Acute on chronic blood loss anemia D62   ??? Anemia due to chronic kidney disease N18.9, D63.1       Past Medical History:        Diagnosis Date   ??? Allergic rhinitis    ??? Anemia    ??? Anesthesia     Nausea/Vomiting Post Op In Past   ??? Anxiety    ??? Arthritis     s/p bilateral hip and right knee replacement sees Dr. Chriss Czar   ??? Back problem     "Occ Back Hurts"   ??? Blood transfusion    ??? CAD (coronary artery disease) 2008    Sees Dr. Brandt Loosen CABG x 3   ??? DCIS (ductal carcinoma in situ) 05/20/2011   ??? Glaucoma     Bilateral Eyes   ??? H/O cardiovascular stress test 05/20/11   ??? Heart attack ([place]) 2006   ??? HOH (hard of hearing)     Bilateral Ears   ??? Hyperlipidemia    ??? Hypertension    ??? IBS (irritable bowel syndrome)    ??? Kidney problem     "Sees  Dr. Maretta Bees For Blood Pressure And Kidneys"   ??? Nausea & vomiting     Nausea/Vomiting Post Op In Past   ??? Osteoarthritis    ??? Osteoporosis    ??? Right Breast Cancer Dx 7-12   ??? Shortness of breath on exertion    ??? Staph infection 1980's    "They sent me to a tropical infection doctor"   ??? TB (tuberculosis)     "Probably had it as a child, my dad had it"   ??? Type II or unspecified type diabetes mellitus without mention of complication, not stated as uncontrolled Dx 1980's       Past Surgical History:        Procedure Laterality Date   ??? BREAST LUMPECTOMY  05/20/2011    Right   ??? BREAST SURGERY  7-12    Right Breast Biopsy   ??? CARDIAC SURGERY  2008    CABG (3 Bypasses)   ??? CARPAL TUNNEL RELEASE  2000's    Right   ??? CESAREAN SECTION  "Late 1950's or Early 1960's"    X 3   ??? CHOLECYSTECTOMY, LAPAROSCOPIC  1990's   ??? COLONOSCOPY  2000's   ??? COLONOSCOPY N/A 03/04/2018    COLONOSCOPY INCOMPLETE DUE TO POOR PREP performed by [name],  MD at [place]   ??? COLONOSCOPY N/A 03/05/2018    COLONOSCOPY CONTROL HEMORRHAGE/ COLON POLYP REMOVAL COLD SNARE/ SIGMOID DIVERTICULITIS/ GRADE 1 INTERNAL HEMORRHOIDS performed by [name], MD at [facility]   ??? CORONARY ARTERY BYPASS GRAFT  2008  cabg x 3   ??? ENDOSCOPY, COLON, DIAGNOSTIC  1990's   ??? HYSTERECTOMY, TOTAL ABDOMINAL  1960's   ??? JOINT REPLACEMENT  1980's    Total Right Hip   ??? JOINT REPLACEMENT  2000's    Total Right Hip   ??? JOINT REPLACEMENT  1990's    Total Left Hip   ??? JOINT REPLACEMENT  1980's    Total Right Knee   ??? OTHER SURGICAL HISTORY      Family Physician Is Dr. Evelina Bucy In [place], [place]   ??? OTHER SURGICAL HISTORY  2000's    "Took section of my intestines out, it was a exploratory operation, they found scar tissue from the c-sections"   ??? [place]   ??? UPPER GASTROINTESTINAL ENDOSCOPY N/A 09/18/2017    EGD BIOPSY performed by [name], MD at [facility]   ??? UPPER GASTROINTESTINAL ENDOSCOPY N/A 02/28/2018    EGD DIAGNOSTIC ONLY performed by Barbette Merino, MD at [place] History:    Social History     Tobacco Use   ??? Smoking status: Never Smoker   ??? Smokeless tobacco: Never Used   Substance Use Topics   ??? Alcohol use: No                                Counseling given: Not Answered      Vital Signs (Current):   Vitals:    03/06/18 1231 03/06/18 1239 03/06/18 1252 03/06/18 1306   BP:  (!) 160/65 (!) 182/50 (!) 203/57   Pulse: 57 62 57 60   Resp: _0 Temp:    36.5 ??C (97.7 ??F)   TempSrc:       SpO2: 100% 100% 100% 100%   Weight:    79 lb 9.4 oz (36.1 kg)   Height:                                                  BP Readings from Last 3 Encounters:   03/06/18 (!) 203/57   03/05/18 (!) 156/57   03/04/18 (!) 172/51       NPO Status: Time of last liquid consumption: 2100                        Time of last solid consumption: 1800                        Date of last liquid consumption: 03/05/18                        Date  of last solid food consumption: 03/03/18    BMI:   Wt Readings from Last 3 Encounters:   03/06/18 79 lb 9.4 oz (36.1 kg)   01/13/18 95 lb (43.1 kg)   12/02/17 107 lb (48.5 kg)     Body mass index is 18.5 kg/m??.    CBC:   Lab Results   Component Value Date    WBC 6.8 02/27/2018    RBC 2.04 02/27/2018    HGB 10.6 03/06/2018    HCT 33.3 03/06/2018    MCV 97.1 02/27/2018  RDW 19.1 02/27/2018    PLT 141 02/27/2018       CMP:   Lab Results   Component Value Date    NA 137 03/06/2018    K 4.1 03/06/2018    CL 105 03/06/2018    CO2 19 03/06/2018    BUN 43 03/06/2018    CREATININE 2.9 03/06/2018    GFRAA 19 03/06/2018    LABGLOM 15 03/06/2018    GLUCOSE 163 03/06/2018    PROT 5.0 02/25/2018    PROT 6.5 04/20/2010    CALCIUM 7.7 03/06/2018    BILITOT 0.9 02/25/2018    ALKPHOS 48 02/25/2018    AST 13 02/25/2018    ALT <5 02/25/2018       POC Tests:   Recent Labs     03/06/18  0656   POCGLU 121*       Coags:   Lab Results   Component Value Date    PROTIME 10.7 02/24/2018    PROTIME 11.8 10/23/2009    INR 0.94 02/24/2018    APTT 43.4 02/24/2018       HCG (If Applicable): No results found for: PREGTESTUR, PREGSERUM, HCG, HCGQUANT     ABGs: No results found for: PHART, PO2ART, PCO2ART, HCO3ART, BEART, O2SATART     Type & Screen (If Applicable):  No results found for: LABABO, [place]    Anesthesia Evaluation  Patient summary reviewed  Airway: Mallampati: III  TM distance: >3 FB   Neck ROM: full  Mouth opening: > = 3 FB Dental:    (+) edentulous      Pulmonary:normal exam                               Cardiovascular:    (+) hypertension:, past MI: > 6 months, CAD:,       NYHA Classification: III                 Beta Blocker:  Order written         Neuro/Psych:               GI/Hepatic/Renal:   (+) bowel prep,           Endo/Other:    (+) DiabetesType II DM, , .                 Abdominal:           Vascular:                                        Anesthesia Plan      MAC     ASA 4       Induction: intravenous.      Anesthetic plan  and risks discussed with patient.      Plan discussed with attending.                  Wonda Olds, APRN - CRNA   03/06/2018

## 2018-03-06 NOTE — Progress Notes (Signed)
Lab called critical Hgb 6.5. Hospitalist PS and new IV access started on pt.

## 2018-03-06 NOTE — Op Note (Addendum)
Judy Velasquez   03/06/2018    Procedure:  PUSH ENTEROSCOPY  Endoscopist: Judy Haste L. Edwena Felty MD  Referring Physician: [name], MD  Pre-procedure Dx: obscure GI bleeding  Post-procedure Dx: duodenal ulcer   Implants: none  Drains: none  Medications: MAC- all meds per anesthesia    Technique:    The Olympus video pediatric colonoscope  was introduced and passed toto a point felt to be 1-2 feet beyond the Ligament of [place].It was carefully wirhdrawn and good visualization was obtained. 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 In the descending duodenum close to, but definitely separate from) the Papilla, was a 6 mm ulcer which did have a red spot in its base. Initially it was coagulated with the small Gold Probe but this resulted in further oozing. Two endoclips were applied with hemostasis achieved. The scope was then withdrawn back into the stomach. The pylorus was symmetrical and patent. The antrum, body, cardia, and fundus portions of the stomach appeared normal, except for a hiatal hernia.The pedi colonoscope was withdrawn and the duodenoscope introduced to be certain the clips were not close to the Papilla--- the Papilla was clearly identified and was remote from the clips.  The patient tolerated the procedure well.     Complications: None  Estimated Blood Loss: <5cc  Specimens: biopsies were not obtained    FINAL IMPRESSION:  1) small ulcer in the descending duodenum- with small red spot in its base- coagulated and clipped as described  2) small hiatal hernia  3) otherwise normal    DISPOSITION:  1) D/C Pepcid and start PPI  2) continue monitoring H/H       NOTE: PHOTOS CAN BE FOUND NOW IN THE SOFT CHART AND IN EPIC IN A FEW DAYS-- GO TO "CHART REVIEW" THEN "MEDIA"    Judy Velasquez

## 2018-03-06 NOTE — Progress Notes (Signed)
Daily Progress Note    I have seen ,spoken to  and examined this patient personally, independently of the Physician assistant . I have reviewed the hospital care given to date and reviewed all pertinent labs and imaging.The plan was developed mutually at the time of the visit with the patient,  PA  and myself. I have spoken with patient, nursing staff and provided written and verbal instructions .The above note has been reviewed and I agree with the assessment, diagnosis, and treatment plan with changes made by me as follows     CARDIOLOGY ATTENDING ADDENDUM    HPI:  I have reviewed the above HPI  And agree with above   Judy Velasquez is a 84 y.o.year old who and presents with had no chief complaint listed for this encounter.  No chief complaint on file.    Interval history:  Patient is awake alert   Doing ok--remain in sinus rate is stable an  BP is ok  Plan for HD , transfuse blood and EGD today  Hemoglobin dropped   CAD /s/p CABG-PAFIB off AC for now due to anemia   ESRD on HD and HTN stable on current meds  Supportive care    EF is normal     Summary  ??This is a limited echo to assess ejection fraction and RVSP.  ??Left ventricular systolic function is normal.  ??Ejection fraction is visually estimated at >50%.  ??Moderate left ventricular hypertrophy.  ??Right ventricular systolic pressure of 47 mm Hg consistent with moderate  ??pulmonary hypertension.  ??Mild aortic insufficiency seen.  ??Mitral annular calcification is present.  ??Mild mitral regurgitation is present.  ??Mild tricuspid regurgitation.  ??No evidence of pericardial effusion    Physical Exam:  General:  Awake alert   Head:normal  GMW:NUUVOZ  Neck:  No JVD   Chest:  Clear to auscultation, respiration easy  Cardiovascular:  sinus  Abdomen:   nontender  Extremities:  No edema    Pulses; palpable  Neuro: grossly normal      MEDICAL DECISION MAKING;    I agree with the above plan, which was planned by myself and discussed with PA.        Marland KitchenElectronically signed by  Scot Jun, MD [facility] on 03/06/2018 at 11:46 AM       Pt. Off floor this am for enteroscopy  HR stable, BP stable    CAD s/p CABG    Stable    Recent stress and echo reassuring    Cont. 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 To follow     MEDICATIONS AT HOME:  She was on Coreg 3.125 b.i.d., _____, hydralazine  100 t.i.d., isosorbide 10 mg t.i.d., Lasix 40 b.i.d., metolazone and  nifedipine ER 30 mg daily, and Protonix.  ??  PAST MEDICAL HISTORY:  Hypertension, renal insufficiency, worsening  renal function and the patient was started on dialysis recently.  A  tunneled catheter was placed.  She had 2 dialysis sessions so far.  ??  ALLERGIES:  CELEBREX, FISH OIL and FISH.  ??  MEDICATIONS:  Lisinopril,  Neurontin, Norvasc, Lipitor, Cipro, and  losartan.          Objective:   BP (!) 147/39    Pulse 63    Temp 97.9 ??F (36.6 ??C)    Resp 17    Ht 4\' 7"  (1.397 m)    Wt 79 lb 9.4 oz (36.1 kg)    SpO2 98%    BMI 18.50 kg/m??       Intake/Output Summary (Last 24 hours) at 03/06/2018 1027  Last data filed at 03/05/2018 1240  Gross per 24 hour   Intake 480 ml   Output --   Net 480 ml       Medications:   Scheduled Meds:  ??? sodium chloride  250 mL Intravenous Once   ??? famotidine  20 mg Oral Daily   ??? sodium chloride  250 mL Intravenous Once   ??? darbepoetin alfa-polysorbate  60 mcg Subcutaneous Weekly - Thursday   ??? insulin lispro  0-6 Units Subcutaneous TID WC   ??? insulin lispro  0-3 Units Subcutaneous 2 times per day   ??? 0.9 % sodium chloride  250 mL Intravenous Once   ??? sodium chloride flush  10 mL Intravenous 2 times per day   ??? carvedilol  3.125 mg Oral BID   ??? docusate sodium  100 mg Oral BID   ??? fluticasone  1 spray Nasal Daily   ??? furosemide  40 mg Oral BID   ??? NIFEdipine  30 mg Oral BID   ??? b complex-C-E-zinc  1  tablet Oral Daily      Infusions:  ??? dextrose        PRN Meds:  polyvinyl alcohol, acetaminophen, oxyCODONE, sodium chloride flush, ondansetron, glucose, dextrose, glucagon (rDNA), dextrose       Physical Exam:  Vitals:    03/06/18 1023   BP: (!) 147/39   Pulse: 63   Resp: 17   Temp:    SpO2: 98%        General: AAO, NAD  Chest: Nontender  Cardiac: First and Second Heart Sounds are Normal, No Murmurs or Gallops noted  Lungs:Clear to auscultation and percussion.  Abdomen: Soft, NT, ND, +BS  Extremities: No clubbing, no edema  Vascular:  Equal 2+ peripheral pulses.        Lab Data:  CBC:   Recent Labs     03/05/18  1804 03/05/18  2330 03/06/18  0850   HGB 7.2* 6.5  HGB CALLED TO CASEY SCOTT,RN 6/14 [address]Y [address]  * 7.1*   HCT 25.5* 22.8* 23.1*     BMP:   Recent Labs     03/04/18  0941 03/05/18  0344 03/06/18  0338   NA 135 139 137   K 4.8 4.4 4.1   CL 103 104 105   CO2 21 22 19*   PHOS 3.7 4.4 4.9   BUN 35* 39* 43*   CREATININE 2.0* 2.5* 2.9*     LIVER PROFILE: No results for input(s): AST, ALT, LIPASE, BILIDIR, BILITOT, ALKPHOS in the last 72 hours.    Invalid input(s):  AMYLASE,  ALB  PT/INR: No results for input(s): PROTIME, INR in the last 72 hours.  APTT: No results for input(s): APTT in the last 72 hours.  BNP:  No results for input(s): BNP in the last 72 hours.      Assessment:  Patient Active Problem List    Diagnosis Date Noted   ??? Anemia due to chronic kidney disease 02/27/2018   ???  Acute renal failure superimposed on stage 4 chronic kidney disease ([place]) 02/24/2018   ??? Uncontrolled type 2 diabetes mellitus ([place]) 02/24/2018   ??? Acute on chronic blood loss anemia 02/24/2018   ??? Traumatic seroma of left lower leg ([place]) 09/19/2017   ??? AKI (acute kidney injury) ([place]) 09/16/2017   ??? ARF (acute renal failure) ([place]) 09/15/2017   ??? Essential hypertension 04/25/2017   ??? PAF (paroxysmal atrial fibrillation) ([place]) 04/25/2017   ??? Stage 4 chronic kidney disease ([place]) 04/25/2017   ??? Chest pain 04/28/2015   ??? Type II or  unspecified type diabetes mellitus without mention of complication, not stated as uncontrolled    ??? Shortness of breath on exertion    ??? Anemia of chronic disease    ??? Kidney problem    ??? CAD (coronary artery disease)    ??? Arthritis        Electronically signed by Sherie Don, PA-C on 03/06/2018 at 10:27 AM

## 2018-03-06 NOTE — Anesthesia Post-Procedure Evaluation (Signed)
Department of Anesthesiology  Postprocedure Note    Patient: Judy Velasquez  MRN: [id number]  Birthdate: Feb 08, 1929  Date of evaluation: 03/06/2018  Time:  3:34 PM     Procedure Summary     Date:  03/06/18 Room / Location:  [facility] / [facility]    Anesthesia Start:  6734 Anesthesia Stop:  1937    Procedure:  ENTEROSCOPY PUSH CONTROL HEMORRHAGE WITH CLIP APPLICATION AND CAUTERIZATION (N/A ) Diagnosis:  (gi bleeding)    Surgeon:  [name], MD Responsible Provider:  Tenna Child, MD    Anesthesia Type:  MAC ASA Status:  4          Anesthesia Type: MAC    Aldrete Phase I:      Aldrete Phase II:      Last vitals: Reviewed and per EMR flowsheets.       Anesthesia Post Evaluation    Patient location during evaluation: bedside  Patient participation: complete - patient participated  Level of consciousness: sleepy but conscious  Complications: no

## 2018-03-07 LAB — TYPE AND SCREEN
ABO/Rh: B POS
Antibody Screen: POSITIVE
Unit Divison: 0
Unit Divison: 0

## 2018-03-07 LAB — CRITICAL CARE PANEL
Anion Gap: 8 (ref 4–16)
BUN: 19 MG/DL (ref 6–23)
CO2: 23 MMOL/L (ref 21–32)
Calcium: 8.1 MG/DL — ABNORMAL LOW (ref 8.3–10.6)
Chloride: 105 mMol/L (ref 99–110)
Creatinine: 1.8 MG/DL — ABNORMAL HIGH (ref 0.6–1.1)
GFR African American: 32 mL/min/{1.73_m2} — ABNORMAL LOW (ref >60)
GFR Non-African American: 27 mL/min/{1.73_m2} — ABNORMAL LOW (ref >60)
Glucose: 181 MG/DL — ABNORMAL HIGH (ref 70–99)
Magnesium: 2.1 mg/dl (ref 1.8–2.4)
Phosphorus: 3 MG/DL (ref 2.5–4.9)
Potassium: 3.9 MMOL/L (ref 3.5–5.1)
Sodium: 136 MMOL/L (ref 135–145)

## 2018-03-07 LAB — POCT GLUCOSE
POC Glucose: 118 MG/DL — ABNORMAL HIGH (ref 70–99)
POC Glucose: 187 MG/DL — ABNORMAL HIGH (ref 70–99)
POC Glucose: 337 MG/DL — ABNORMAL HIGH (ref 70–99)
POC Glucose: 62 MG/DL — ABNORMAL LOW (ref 70–99)
POC Glucose: 73 MG/DL (ref 70–99)
POC Glucose: 96 MG/DL (ref 70–99)

## 2018-03-07 LAB — HEMOGLOBIN AND HEMATOCRIT
Hematocrit: 37.8 % (ref 37–47)
Hemoglobin: 12 GM/DL — ABNORMAL LOW (ref 12.5–16.0)

## 2018-03-07 MED ORDER — PILL SPLITTER MISC
Status: AC
Start: 2018-03-07 — End: 2018-03-07
  Administered 2018-03-07: 05:00:00

## 2018-03-07 MED FILL — OXYCODONE HCL 5 MG PO TABS: 5 mg | ORAL | Qty: 1

## 2018-03-07 MED FILL — STRESS B-COMPLEX/VIT C/ZINC PO TABS: ORAL | Qty: 1

## 2018-03-07 MED FILL — NIFEDIPINE ER OSMOTIC RELEASE 30 MG PO TB24: 30 mg | ORAL | Qty: 1

## 2018-03-07 MED FILL — DOK 100 MG PO CAPS: 100 mg | ORAL | Qty: 1

## 2018-03-07 MED FILL — FUROSEMIDE 40 MG PO TABS: 40 mg | ORAL | Qty: 1

## 2018-03-07 MED FILL — PANTOPRAZOLE SODIUM 40 MG PO TBEC: 40 mg | ORAL | Qty: 1

## 2018-03-07 MED FILL — CARVEDILOL 3.125 MG PO TABS: 3.125 mg | ORAL | Qty: 1

## 2018-03-07 MED FILL — PAIN & FEVER 325 MG PO TABS: 325 mg | ORAL | Qty: 2

## 2018-03-07 MED FILL — PILL SPLITTER MISC: Qty: 1

## 2018-03-07 NOTE — Progress Notes (Signed)
Daily Progress Note     Pt. Awake alert and feeling ok  HR stable, BP stable  Denies CP, SOB  ??  CAD s/p CABG    Stable    Recent stress and echo reassuring    Cont. Current meds  ??  PAF    Seen on recent holter    No AC for now-would recommend when ok with GI    On Coreg  ??  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  ESRD    On HD    Per renal  ??  Will cont. To follow-stable from cardiac standpoint-ok to D/C when ok with primary team    Summary  ??This is a limited echo to assess ejection fraction and RVSP.  ??Left ventricular systolic function is normal.  ??Ejection fraction is visually estimated at >50%.  ??Moderate left ventricular hypertrophy.  ??Right ventricular systolic pressure of 47 mm Hg consistent with moderate  ??pulmonary hypertension.  ??Mild aortic insufficiency seen.  ??Mitral annular calcification is present.  ??Mild mitral regurgitation is present.  ??Mild tricuspid regurgitation.  ??No evidence of pericardial effusion.  ??   MEDICATIONS AT HOME: ??She was on Coreg 3.125 b.i.d., _____, hydralazine  100 t.i.d., isosorbide 10 mg t.i.d., Lasix 40 b.i.d., metolazone and  nifedipine ER 30 mg daily, and Protonix.  ??  PAST MEDICAL HISTORY: ??Hypertension, renal insufficiency, worsening  renal function and the patient was started on dialysis recently. ??A  tunneled catheter was placed. ??She had 2 dialysis sessions so far.  ??  ALLERGIES: ??CELEBREX, FISH OIL and FISH.  ??  MEDICATIONS: ??Lisinopril, Neurontin, Norvasc, Lipitor, Cipro, and  losartan.          Objective:   BP (!) 141/58    Pulse 69    Temp 97.9 ??F (36.6 ??C) (Oral)    Resp 20    Ht 4\' 7"  (1.397 m)    Wt 80 lb 4 oz (36.4 kg)    SpO2 98%    BMI 18.65 kg/m??       Intake/Output Summary (Last 24 hours) at 03/07/2018 1016  Last data filed at 03/06/2018 1533  Gross per 24 hour   Intake 2104 ml   Output 1087 ml   Net 1017 ml        Medications:   Scheduled Meds:  ??? pantoprazole  40 mg Oral QAM AC   ??? darbepoetin alfa-polysorbate  60 mcg Subcutaneous Weekly - Thursday   ??? insulin lispro  0-6 Units Subcutaneous TID WC   ??? insulin lispro  0-3 Units Subcutaneous 2 times per day   ??? 0.9 % sodium chloride  250 mL Intravenous Once   ??? sodium chloride flush  10 mL Intravenous 2 times per day   ??? carvedilol  3.125 mg Oral BID   ??? docusate sodium  100 mg Oral BID   ??? fluticasone  1 spray Nasal Daily   ??? furosemide  40 mg Oral BID   ??? NIFEdipine  30 mg Oral BID   ??? b complex-C-E-zinc  1 tablet Oral Daily      Infusions:  ??? dextrose        PRN Meds:  polyvinyl alcohol, acetaminophen, oxyCODONE, sodium chloride flush, ondansetron, glucose, dextrose, glucagon (rDNA), dextrose       Physical Exam:  Vitals:    03/07/18 1011   BP: (!) 141/58   Pulse: 69   Resp:    Temp:    SpO2:         General: AAO, NAD  Chest: Nontender  Cardiac: First and Second Heart Sounds are Normal, No Murmurs or Gallops noted  Lungs:Clear to auscultation and percussion.  Abdomen: Soft, NT, ND, +BS  Extremities: No clubbing, no edema  Vascular:  Equal 2+ peripheral pulses.        Lab Data:  CBC:   Recent Labs     03/06/18  0850 03/06/18  1300 03/07/18  0133   HGB 7.1* 10.6* 12.0*   HCT 23.1* 33.3* 37.8     BMP:   Recent Labs     03/05/18  0344 03/06/18  0338 03/07/18  0133   NA 139 137 136   K 4.4 4.1 3.9   CL 104 105 105   CO2 22 19* 23   PHOS 4.4 4.9 3.0   BUN 39* 43* 19   CREATININE 2.5* 2.9* 1.8*     LIVER PROFILE: No results for input(s): AST, ALT, LIPASE, BILIDIR, BILITOT, ALKPHOS in the last 72 hours.    Invalid input(s):  AMYLASE,  ALB  PT/INR: No results for input(s): PROTIME, INR in the last 72 hours.  APTT: No results for input(s): APTT in the last 72 hours.  BNP:  No results for input(s): BNP in the last 72 hours.      Assessment:  Patient Active Problem List    Diagnosis Date Noted   ??? Anemia due to chronic kidney disease 02/27/2018   ??? Acute renal failure  superimposed on stage 4 chronic kidney disease ([place]) 02/24/2018   ??? Uncontrolled type 2 diabetes mellitus ([place]) 02/24/2018   ??? Acute on chronic blood loss anemia 02/24/2018   ??? Traumatic seroma of left lower leg ([place]) 09/19/2017   ??? AKI (acute kidney injury) ([place]) 09/16/2017   ??? ARF (acute renal failure) ([place]) 09/15/2017   ??? Essential hypertension 04/25/2017   ??? PAF (paroxysmal atrial fibrillation) ([place]) 04/25/2017   ??? Stage 4 chronic kidney disease ([place]) 04/25/2017   ??? Chest pain 04/28/2015   ??? Type II or unspecified type diabetes mellitus without mention of complication, not stated as uncontrolled    ??? Shortness of breath on exertion    ??? Anemia of chronic disease    ??? Kidney problem    ??? CAD (coronary artery disease)    ??? Arthritis        Electronically signed by Sherie Don, PA-C on 03/07/2018 at 10:16 AM

## 2018-03-07 NOTE — Progress Notes (Signed)
FSBS-62, patient asymptomatic. Snack provided. Will continue to monitor.

## 2018-03-07 NOTE — Progress Notes (Addendum)
Hospitalist Progress Note      Name:  Judy Velasquez DOB/Age/Sex: 1928-10-01  (84 y.o. female)   MRN & CSN:  [id number] & [id number] Admission Date/Time: 02/24/2018  9:52 AM   Location:  3001/3001-A PCP: [name], MD         Hospital Day: 12    Assessment and Plan:   Judy Velasquez is a 84 y.o.  female  who presents with Acute renal failure superimposed on stage 4 chronic kidney disease ([place])            Acute on chronic anemia secondary to blood loss  Push enteroscopy --> Small Duodenal Ulcer with red spots coagulated and clipped   EGD negative. Repeat colonoscopy: probable AVM in the cecum- cauterized with the APC   Hgb 12  H/H Q8h  Occult blood +Ve  GI on board   ??    ESRD on HD  Tunneled cath placed --> HD  Op dialysis next week  Daily weights  I/O  Nephrology on board  Per CM: Pt has been approved for dialysis at [facility] in [place].  ??  DM   Cont sliding scale insulin  CB monitoring  ??  CAD PAF  Hyperlipidemia hypertension  Cont home meds  Cardio on consult    Judy Velasquez  ??    Chronic treatment continued per home medications unless  contraindicated by above plan and assessment.    The above assessment/plan has been explained to the patient, who indicated understanding.      Diet DIET GENERAL; No Added Salt (3-4 GM); Daily Fluid Restriction: 1500 ml   DVT Prophylaxis []  Lovenox, []   Heparin, [x]  SCDs, [] No VTE prophylaxis, patient ambulating   GI Prophylaxis []  PPI, [x]  H2 Blocker, []  No GI prophylaxis, patient is receiving diet/Tube Feeds   Code Status Full Code             History of Present Illness:     Chief Complaint: Acute renal failure superimposed on stage 4 chronic kidney disease ([place])    Patient was seen and examined at bedside today. Patient sitting up comfortably in bed in NAD.   Ten point ROS reviewed negative, unless as noted above    Objective:       Intake/Output Summary (Last 24 hours) at 03/07/2018 0854  Last data filed at 03/06/2018 1533  Gross per 24 hour   Intake 2104 ml    Output 1087 ml   Net 1017 ml      Vitals:   Vitals:    03/07/18 0213   BP: (!) 142/57   Pulse: 70   Resp: 20   Temp: 97.9 ??F (36.6 ??C)   SpO2: 98%     Physical Exam:      GEN Awake female, sitting upright in bed in no apparent distress. Appears given age.  EYES Pupils are equally round.  No scleral erythema, discharge, or conjunctivitis.  HENT Mucous membranes are moist.   RESP Clear to auscultation, no wheezes, rales or rhonchi.    CARDIO/VASC S1/S2 auscultated. No murmurs  GI Abdomen is soft without significant tenderness, masses, or guarding.   MSK No gross joint deformities. Spontaneous movement of all extremities  SKIN Normal coloration, warm, dry.  NEURO Grossly normal  PSYCH Awake, alert    Medications:   Medications:   ??? pantoprazole  40 mg Oral QAM AC   ??? darbepoetin alfa-polysorbate  60 mcg Subcutaneous Weekly - Thursday   ??? insulin lispro  0-6 Units Subcutaneous TID WC   ??? insulin lispro  0-3 Units Subcutaneous 2 times per day   ??? 0.9 % sodium chloride  250 mL Intravenous Once   ??? sodium chloride flush  10 mL Intravenous 2 times per day   ??? carvedilol  3.125 mg Oral BID   ??? docusate sodium  100 mg Oral BID   ??? fluticasone  1 spray Nasal Daily   ??? furosemide  40 mg Oral BID   ??? NIFEdipine  30 mg Oral BID   ??? b complex-C-E-zinc  1 tablet Oral Daily      Infusions:   ??? dextrose       PRN Meds:     polyvinyl alcohol 1 drop PRN   acetaminophen 650 mg Q4H PRN   oxyCODONE 2.5 mg Q4H PRN   sodium chloride flush 10 mL PRN   ondansetron 4 mg Q6H PRN   glucose 15 g PRN   dextrose 12.5 g PRN   glucagon (rDNA) 1 mg PRN   dextrose 100 mL/hr PRN         Electronically signed by Bailey Mech, MD on 03/07/2018 at 8:54 AM

## 2018-03-07 NOTE — Plan of Care (Signed)
Problem: Infection:  Goal: Will remain free from infection  Description  Will remain free from infection  Outcome: Ongoing     Problem: Safety:  Goal: Free from accidental physical injury  Description  Free from accidental physical injury  Outcome: Ongoing  Goal: Free from intentional harm  Description  Free from intentional harm  Outcome: Ongoing     Problem: Daily Care:  Goal: Daily care needs are met  Description  Daily care needs are met  Outcome: Ongoing     Problem: Pain:  Goal: Patient's pain/discomfort is manageable  Description  Patient's pain/discomfort is manageable  Outcome: Ongoing  Goal: Pain level will decrease  Description  Pain level will decrease  Outcome: Ongoing  Goal: Control of acute pain  Description  Control of acute pain  Outcome: Ongoing  Goal: Control of chronic pain  Description  Control of chronic pain  Outcome: Ongoing     Problem: Discharge Planning:  Goal: Patients continuum of care needs are met  Description  Patients continuum of care needs are met  Outcome: Ongoing     Problem: Falls - Risk of:  Goal: Will remain free from falls  Description  Will remain free from falls  Outcome: Ongoing  Goal: Absence of physical injury  Description  Absence of physical injury  Outcome: Ongoing     Problem: Risk for Impaired Skin Integrity  Goal: Tissue integrity - skin and mucous membranes  Description  Structural intactness and normal physiological function of skin and  mucous membranes.  Outcome: Ongoing     Problem: Nutrition  Goal: Optimal nutrition therapy  Outcome: Ongoing

## 2018-03-07 NOTE — Progress Notes (Signed)
Laboratory called, notified of every eight hour H&H, last one drawn at 1300 on 03/06/18. Laboratory states "I do not see the order on my side." Noted. Duplicate order placed at this time.

## 2018-03-07 NOTE — Progress Notes (Signed)
-   on room air  - eating breakfast  - eager to go home  - had HD yesterday    A&P:  - Anemia from descending duodenal ulcer that was coagulated and clipped on 03/06/18  - esrd on hd : will continue as outpt in [place]  - HTN    Plan;  Next hd on Monday  hgb better  Ok to dc per renal,

## 2018-03-07 NOTE — Progress Notes (Signed)
Laboratory called, asked if 1am H&h has been drawn, laboratory states "the H&h has been moved to 2am." This RN states "please drawn lab asap." Laboratory states "I will let the phlebotomist know." Noted.

## 2018-03-08 LAB — CRITICAL CARE PANEL
Anion Gap: 11 (ref 4–16)
BUN: 27 MG/DL — ABNORMAL HIGH (ref 6–23)
CO2: 20 MMOL/L — ABNORMAL LOW (ref 21–32)
Calcium: 7.9 MG/DL — ABNORMAL LOW (ref 8.3–10.6)
Chloride: 102 mMol/L (ref 99–110)
Creatinine: 2.3 MG/DL — ABNORMAL HIGH (ref 0.6–1.1)
GFR African American: 24 mL/min/{1.73_m2} — ABNORMAL LOW (ref >60)
GFR Non-African American: 20 mL/min/{1.73_m2} — ABNORMAL LOW (ref >60)
Glucose: 138 MG/DL — ABNORMAL HIGH (ref 70–99)
Magnesium: 2 mg/dl (ref 1.8–2.4)
Phosphorus: 3.2 MG/DL (ref 2.5–4.9)
Potassium: 3.8 MMOL/L (ref 3.5–5.1)
Sodium: 133 MMOL/L — ABNORMAL LOW (ref 135–145)

## 2018-03-08 LAB — POCT GLUCOSE
POC Glucose: 203 MG/DL — ABNORMAL HIGH (ref 70–99)
POC Glucose: 172 MG/DL — ABNORMAL HIGH (ref 70–99)
POC Glucose: 101 MG/DL — ABNORMAL HIGH (ref 70–99)
POC Glucose: 207 MG/DL — ABNORMAL HIGH (ref 70–99)

## 2018-03-08 LAB — HEPATIC FUNCTION PANEL
ALT: 5 U/L — ABNORMAL LOW (ref 10–40)
AST: 16 IU/L (ref 15–37)
Albumin: 2.7 GM/DL — ABNORMAL LOW (ref 3.4–5.0)
Alkaline Phosphatase: 53 IU/L (ref 40–129)
Bilirubin, Direct: 0.2 MG/DL (ref 0.0–0.3)
Bilirubin, Indirect: 0.1 MG/DL (ref 0–0.7)
Total Bilirubin: 0.3 MG/DL (ref 0.0–1.0)
Total Protein: 4.3 GM/DL — ABNORMAL LOW (ref 6.4–8.2)

## 2018-03-08 LAB — HEMOGLOBIN AND HEMATOCRIT
Hematocrit: 36.2 % — ABNORMAL LOW (ref 37–47)
Hematocrit: 42 % (ref 37–47)
Hemoglobin: 11.5 GM/DL — ABNORMAL LOW (ref 12.5–16.0)
Hemoglobin: 13.1 GM/DL (ref 12.5–16.0)

## 2018-03-08 MED ORDER — PANTOPRAZOLE SODIUM 40 MG PO TBEC
40 MG | ORAL_TABLET | Freq: Every day | ORAL | 3 refills | Status: AC
Start: 2018-03-08 — End: ?

## 2018-03-08 MED FILL — CARVEDILOL 3.125 MG PO TABS: 3.125 mg | ORAL | Qty: 1

## 2018-03-08 MED FILL — PROPOFOL 200 MG/20ML IV EMUL: 200 MG/20ML | INTRAVENOUS | Qty: 20

## 2018-03-08 MED FILL — FUROSEMIDE 40 MG PO TABS: 40 mg | ORAL | Qty: 1

## 2018-03-08 MED FILL — LIDOCAINE HCL (CARDIAC) PF 100 MG/5ML IV SOLN: 100 MG/5ML | INTRAVENOUS | Qty: 5

## 2018-03-08 MED FILL — DOK 100 MG PO CAPS: 100 mg | ORAL | Qty: 1

## 2018-03-08 MED FILL — PANTOPRAZOLE SODIUM 40 MG PO TBEC: 40 mg | ORAL | Qty: 1

## 2018-03-08 MED FILL — STRESS B-COMPLEX/VIT C/ZINC PO TABS: ORAL | Qty: 1

## 2018-03-08 MED FILL — NIFEDIPINE ER OSMOTIC RELEASE 30 MG PO TB24: 30 mg | ORAL | Qty: 1

## 2018-03-08 NOTE — Progress Notes (Addendum)
Daily Progress Note     Pt. Awake alert and feeling well  HR stable, BP stable, NSR  Denies CP, SOB  ??  CAD s/p CABG  ????Stable  ????Recent stress and echo reassuring  ????Cont. Current meds  ??  PAF  ????Seen on recent holter  ????No AC for now-may not be able to tolerate with recent GI bleed  ????On Coreg  ??  Anemia  ????GI following  ????AVM s/p APC coagulation by GI  ????Hgb dropped again-s/p transfusion  ????repeat EGD showed-duodenal ulcer    Hgb 13.1 today  ????No blood thinners  ??  ESRD  ????On HD  ????Per renal  ??  Will cont. To follow-stable from cardiac standpoint-ok to D/C when ok with primary team  If D/C f/u at office in 2 weeks  ??  Summary  ??This is a limited echo to assess ejection fraction and RVSP.  ??Left ventricular systolic function is normal.  ??Ejection fraction is visually estimated at >50%.  ??Moderate left ventricular hypertrophy.  ??Right ventricular systolic pressure of 47 mm Hg consistent with moderate  ??pulmonary hypertension.  ??Mild aortic insufficiency seen.  ??Mitral annular calcification is present.  ??Mild mitral regurgitation is present.  ??Mild tricuspid regurgitation.  ??No evidence of pericardial effusion.  ??  ??MEDICATIONS AT HOME: ??She was on Coreg 3.125 b.i.d., _____, hydralazine  100 t.i.d., isosorbide 10 mg t.i.d., Lasix 40 b.i.d., metolazone and  nifedipine ER 30 mg daily, and Protonix.  ??  PAST MEDICAL HISTORY: ??Hypertension, renal insufficiency, worsening  renal function and the patient was started on dialysis recently. ??A  tunneled catheter was placed. ??She had 2 dialysis sessions so far.  ??  ALLERGIES: ??CELEBREX, FISH OIL and FISH.  ??  MEDICATIONS: ??Lisinopril, Neurontin, Norvasc, Lipitor, Cipro, and  losartan.          Objective:   BP (!) 124/55    Pulse 67    Temp 97.7 ??F (36.5 ??C) (Oral)    Resp 13    Ht 4\' 7"  (1.397 m)    Wt 80 lb 4 oz (36.4 kg)    SpO2 100%    BMI 18.65 kg/m??       Intake/Output Summary (Last 24 hours) at 03/08/2018 1023  Last data filed at 03/08/2018 0015  Gross per 24 hour    Intake --   Output 100 ml   Net -100 ml       Medications:   Scheduled Meds:  ??? pantoprazole  40 mg Oral QAM AC   ??? darbepoetin alfa-polysorbate  60 mcg Subcutaneous Weekly - Thursday   ??? insulin lispro  0-6 Units Subcutaneous TID WC   ??? insulin lispro  0-3 Units Subcutaneous 2 times per day   ??? 0.9 % sodium chloride  250 mL Intravenous Once   ??? sodium chloride flush  10 mL Intravenous 2 times per day   ??? carvedilol  3.125 mg Oral BID   ??? docusate sodium  100 mg Oral BID   ??? fluticasone  1 spray Nasal Daily   ??? furosemide  40 mg Oral BID   ??? NIFEdipine  30 mg Oral BID   ??? b complex-C-E-zinc  1 tablet Oral Daily      Infusions:  ??? dextrose        PRN Meds:  polyvinyl alcohol, acetaminophen, oxyCODONE, sodium chloride flush, ondansetron, glucose, dextrose, glucagon (rDNA), dextrose       Physical Exam:  Vitals:    03/08/18 0835   BP: (!) 124/55  Pulse: 67   Resp: 13   Temp:    SpO2: 100%        General: AAO, NAD  Chest: Nontender  Cardiac: First and Second Heart Sounds are Normal, No Murmurs or Gallops noted  Lungs:Clear to auscultation and percussion.  Abdomen: Soft, NT, ND, +BS  Extremities: No clubbing, no edema  Vascular:  Equal 2+ peripheral pulses.        Lab Data:  CBC:   Recent Labs     03/07/18  0133 03/07/18  2019 03/08/18  0813   HGB 12.0* 11.5* 13.1   HCT 37.8 36.2* 42.0     BMP:   Recent Labs     03/06/18  0338 03/07/18  0133 03/08/18  0813   NA 137 136 133*   K 4.1 3.9 3.8   CL 105 105 102   CO2 19* 23 20*   PHOS 4.9 3.0 3.2   BUN 43* 19 27*   CREATININE 2.9* 1.8* 2.3*     LIVER PROFILE:   Recent Labs     03/07/18  2019   AST 16   ALT 5*   BILIDIR 0.2   BILITOT 0.3   ALKPHOS 53     PT/INR: No results for input(s): PROTIME, INR in the last 72 hours.  APTT: No results for input(s): APTT in the last 72 hours.  BNP:  No results for input(s): BNP in the last 72 hours.      Assessment:  Patient Active Problem List    Diagnosis Date Noted   ??? Anemia due to chronic kidney disease 02/27/2018   ??? Acute renal  failure superimposed on stage 4 chronic kidney disease ([place]) 02/24/2018   ??? Uncontrolled type 2 diabetes mellitus ([place]) 02/24/2018   ??? Acute on chronic blood loss anemia 02/24/2018   ??? Traumatic seroma of left lower leg ([place]) 09/19/2017   ??? AKI (acute kidney injury) ([place]) 09/16/2017   ??? ARF (acute renal failure) ([place]) 09/15/2017   ??? Essential hypertension 04/25/2017   ??? PAF (paroxysmal atrial fibrillation) ([place]) 04/25/2017   ??? Stage 4 chronic kidney disease ([place]) 04/25/2017   ??? Chest pain 04/28/2015   ??? Type II or unspecified type diabetes mellitus without mention of complication, not stated as uncontrolled    ??? Shortness of breath on exertion    ??? Anemia of chronic disease    ??? Kidney problem    ??? CAD (coronary artery disease)    ??? Arthritis        Electronically signed by Sherie Don, PA-C on 03/08/2018 at 10:23 AM
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
## 2018-03-08 NOTE — Discharge Summary (Signed)
Discharge Summary    Name:  Judy Velasquez DOB/Age/Sex: 09/19/1929  (84 y.o. female)   MRN & CSN:  [id number] & [id number] Admission Date/Time: 02/24/2018  9:52 AM   Attending:  Bailey Mech, MD Discharging Physician: Bailey Mech, MD     HPI:   Per Admission H&P       Acute renal failure superimposed on stage 4 chronic kidney disease ([place])  Judy Velasquez is a 84 y.o. female who is direct admit from Dr. Maretta Bees office for low hemoglobin level and need dialysis. Patient has PMH of CAD, PAF, CKD stage 4, DM type 2, hyperlipidemia, and hypertension. Patient is awake, alert, and oriented only to self and place but disoriented to time and situation. Patient stated that she is not okay because she cannot see. Patient has h/o of glaucoma. But patient can identify how many fingers the nurse at bedside holding. Pt seen and examined. Patient denies CP, palpitation, fever, chills or diaphoresis. Denies cough, SOB or difficulty breathing. Denies any other symptoms including HA, paresthesias, abdominal pain, N/V/D, constipation, changes in bowels, dysuria, hematuria, frequency or urgency, and B/L weakness.     Hospital Course:   Judy Velasquez is a 84 y.o.  female  who presents with Acute renal failure superimposed on stage 4 chronic kidney disease (HCC)      Acute on chronic anemia secondary to blood loss 22 UGIB  Push enteroscopy --> Small Duodenal Ulcer with red spots coagulated and clipped   EGD negative.   Repeat colonoscopy: probable AVM in the cecum- cauterized with the APC   Hgb stable   Continue Protonix  Follow up with PCP/GI outpatient   ??  ??  ESRD on HD  HD tomorrow   Pt has been approved for dialysis at [facility] [place]    ??  DM   Continue home medications   CAD PAF  Hyperlipidemia hypertension  Cont home meds    ??  DC plan Home with [facility]        The patient expressed appropriate understanding of and agreement with the discharge recommendations, medications, and plan.     Consults this admission:  IP CONSULT TO  INTERVENTIONAL RADIOLOGY  IP CONSULT TO CASE MANAGEMENT  IP CONSULT TO NEPHROLOGY  IP CONSULT TO ENDOCRINOLOGY  IP CONSULT TO INTERVENTIONAL RADIOLOGY  IP CONSULT TO GI  IP CONSULT TO GI  IP CONSULT TO CARDIOLOGY  IP CONSULT TO [place]    Discharge Instruction:   Follow up appointments:   Nephrology  Primary care physician:  within 2 weeks    Diet:  renal diet   Activity: activity as tolerated  Disposition: Discharged to:   [x] Home, [] HHC, [] SNF, [] Acute Rehab, [] Hospice   Condition on discharge: Stable    Discharge Medications:      Judy Velasquez, Judy Velasquez   Home Medication Instructions PPI:[id number]    Printed on:03/08/18 1213   Medication Information                      acetaminophen (TYLENOL) 500 MG tablet  Take 1,000 mg by mouth             B Complex-Biotin-FA (SUPER B-50 B COMPLEX PO)  Take by mouth             carvedilol (COREG) 3.125 MG tablet  Take 1 tablet by mouth 2 times daily             docusate sodium (PHILLIPS STOOL SOFTENER) 100 MG capsule  Take 1 capsule by mouth 2 times daily             fluticasone (FLONASE) 50 MCG/ACT nasal spray  1 spray by Nasal route daily             furosemide (LASIX) 40 MG tablet  Take 40 mg by mouth 2 times daily             hydrALAZINE (APRESOLINE) 100 MG tablet  Take 1 tablet by mouth 3 times daily             HYDROcodone-acetaminophen (NORCO) 5-325 MG per tablet  Take 1 tablet by mouth every 6 hours as needed for Pain.             iron polysaccharides (FERREX 150) 150 MG capsule  Take 150 mg by mouth             isosorbide dinitrate (ISORDIL) 30 MG tablet  Take 1 tablet by mouth 3 times daily             metolazone (ZAROXOLYN) 2.5 MG tablet               NIFEdipine (ADALAT CC) 30 MG extended release tablet  Take 1 tablet by mouth 2 times daily             ondansetron (ZOFRAN-ODT) 8 MG TBDP disintegrating tablet               pantoprazole (PROTONIX) 40 MG tablet  Take 1 tablet by mouth every morning (before breakfast)             polyethylene glycol (GLYCOLAX)  packet  Take 17 g by mouth                 Objective Findings at Discharge:   BP (!) 124/55    Pulse 67    Temp 97.7 ??F (36.5 ??C) (Oral)    Resp 13    Ht 4\' 7"  (1.397 m)    Wt 80 lb 4 oz (36.4 kg)    SpO2 100%    BMI 18.65 kg/m??            PHYSICAL EXAM   GEN    Awake female, sitting upright in bed in no apparent distress. Appears given age.  EYES   Pupils are equally round.  No scleral erythema, discharge, or conjunctivitis.  HENT  Mucous membranes are moist.   RESP  Clear to auscultation, no wheezes, rales or rhonchi.    CARDIO/VASC           S1/S2 auscultated. No murmurs  GI        Abdomen is soft without significant tenderness, masses, or guarding.   MSK    No gross joint deformities. Spontaneous movement of all extremities  SKIN    Normal coloration, warm, dry.  NEURO           Grossly normal  PSYCH            Awake, alert            BMP/CBC  Recent Labs     03/06/18  0338  03/07/18  0133 03/07/18  2019 03/08/18  0813   NA 137  --  136  --  133*   K 4.1  --  3.9  --  3.8   CL 105  --  105  --  102   CO2 19*  --  23  --  20*   BUN 43*  --  19  --  27*   CREATININE 2.9*  --  1.8*  --  2.3*   HCT  --    < > 37.8 36.2* 42.0    < > = values in this interval not displayed.       IMAGING:    IR TUNNELED CVC PLACE WO SQ PORT/PUMP > 5 YEARS [[id number]] Collected: 02/27/18 1514    ?? Order Status: Completed Updated: 02/27/18 1518   ?? Narrative: ??   ?? PROCEDURE:  ULTRASOUND GUIDED VASCULAR ACCESS.  FLUOROSCOPY GUIDED PLACEMENT OF A TUNNELED CATHETER.    MODERATE CONSCIOUS SEDATION    02/27/2018.        HISTORY:  ORDERING SYSTEM PROVIDED HISTORY: dialysis  TECHNOLOGIST PROVIDED HISTORY:  Reason for exam:->dialysis  How many lumens are being requested?->2  What side should this line be placed?->Right  What site is the preferred site?->Internal Jugular    SEDATION:  50 mcg of fentanyl    FLUOROSCOPY DOSE AND TYPE OR TIME AND EXPOSURES:  0.2 minutes of fluoroscopy AKA of 1    TECHNIQUE:  Informed consent was obtained after a  detailed explanation of the procedure  including risks, benefits, and alternatives. Universal protocol was observed.  The right neck and chest were prepped and draped in sterile fashion using  maximum sterile barrier technique. Local anesthesia was achieved with  lidocaine. A micropuncture needle was used to access the right internal  jugular vein using ultrasound guidance. ??An ultrasound image demonstrating  patency of the vein with needle tip located within it. An image was obtained  and stored in PACs. ??A 0.035 guidewire was used to place a peel-away sheath.  A subcutaneous tunnel was created to the infraclavicular region and a  tunneled ??catheter was pulled through the subcutaneous tunnel to the venotomy  site and advanced through the peel-away sheath under fluoroscopic guidance to  the right atrium. ??The catheter flushed easily and there was a good blood  return. ??The catheter was sutured to the skin. ??The catheter was locked with  heparinized saline. The patient tolerated the procedure well and there were  no immediate complications.    FINDINGS:  Fluoroscopic image demonstrates the tip of the catheter in the right atrium.    Fluoroscopic image demonstrates the tip of the port at the cavo-atrial  junction .       ?? Impression: ??   ?? Successful ultrasound and fluoroscopy guided tunneled catheter placement .   ?? IR TUNNELED CVC PLACE WO SQ PORT/PUMP > 5 YEARS [[id number]] Collected: 02/25/18 1420   ?? Order Status: Completed Updated: 02/25/18 1424   ?? Narrative: ??   ?? EXAMINATION:  Non-tunneled right sided internal jugular vein ultrasound guided temporary  dialysis catheter placement. ?? 02/24/2018 2:24 pm    TECHNIQUE:  After obtaining informed consent, the right neck was then cleansed and draped  in the usual sterile fashion. 1% Lidocaine was used for local anesthesia.  Under ultrasound guidance, the right internal jugular vein was visualized and  cannulated using a micropuncture needle. An .018 wire was then  placed into  the vein. A 5-French micropuncture dilator was then introduced into the vein  under fluoroscopic guidance. An 0.35 guide wire was then advanced into the  vein and placed within the superior vena cava. Next, a triple lumen 12-French  Mah[place]kar hemodialysis catheter was placed into the vein over the wire using a  standard Seldinger technique.    The catheter was then stabilized to the skin using silk sutures.  The patient  tolerated the procedure well without any immediate complications. All ports  flushed and aspirated blood without any problems.    PHYSICIANS:  Frances Maywood    COMPARISON:  None.    HISTORY:  ORDERING SYSTEM PROVIDED HISTORY: dialysis  TECHNOLOGIST PROVIDED HISTORY:  Reason for exam:->dialysis  How many lumens are being requested?->2  What side should this line be placed?->Right  What site is the preferred site?->Internal Jugular    FINDINGS:  Fluoroscopic image demonstrates the tip of the port at the cavo-atrial  junction .       ?? Impression: ??   ?? Successful temporary hemodialysis catheter placement as described above.   ?? XR CHEST PORTABLE [[id number]] Collected: 02/24/18 1441   ?? Order Status: Completed Updated: 02/24/18 1444   ?? Narrative: ??   ?? EXAMINATION:  ONE XRAY VIEW OF THE CHEST    02/24/2018 2:37 pm    COMPARISON:  Chest x-ray August 18, 2017    HISTORY:  ORDERING SYSTEM PROVIDED HISTORY: post cvc  TECHNOLOGIST PROVIDED HISTORY:  Reason for exam:->post cvc  Ordering Physician Provided Reason for Exam: post cvc  Acuity: Unknown  Type of Exam: Subsequent/Follow-up    FINDINGS:  Cardiac silhouette is stable. ??Dense atherosclerotic changes of the aorta.  Mitral valve calcification. ??Postoperative changes of median sternotomy. ??No  focal consolidation, pleural effusion, or pneumothorax identified.  Right-sided IJ central venous catheter with tip at the level of the  cavoatrial junction. ??The bones are osteopenic. ??No acute osseous abnormality  identified. ??Surgical clips project  over the upper abdomen.   ?? Impression: ??   ?? 1. Right IJ central venous catheter tip at the level of the cavoatrial  junction. ??No pneumothorax identified.   ??         Discharge Time of 35 minutes    Electronically signed by Bailey Mech, MD on 03/08/2018 at 12:13 PM

## 2018-03-08 NOTE — Progress Notes (Signed)
-   doing well on room air  - hgb 11.5 last night  - hd on Monday either inpatient or outpt depending on whether she gets discharged  - continue supportive mgmt

## 2018-04-06 NOTE — Telephone Encounter (Signed)
mammo

## 2018-05-11 ENCOUNTER — Ambulatory Visit: Primary: Internal Medicine

## 2018-05-11 ENCOUNTER — Ambulatory Visit: Payer: MEDICARE | Primary: Internal Medicine

## 2018-05-19 ENCOUNTER — Encounter: Attending: Surgery | Primary: Internal Medicine

## 2018-05-19 NOTE — Progress Notes (Deleted)
Judy Velasquez is here for cancer of the right Breast. DCIS  Procedure: lumpectomy Date: 03/2011   Hormonal Therapy:yes, anastrazole  Chemotherapy: no  Radiation Therapy: done      Current Evaluation:   Today the patient is basically feeling well. She denies new breast mass, nipple discharge, or breast pain. No new subcutaneous mass, headache, bone pain, new cough, or abdominal pain.     Physical Exam:   Generally healthy appearing, {Race/ethnicity:17218} female   Skin: no new subcutaneous mass   Breast: non-tender, no new mass   Lungs clear   Heart RRR  Lymphadenopathy: no new axillary and supraclavicular   Abdomen: non-tender without liver, spleen, kidney, or mass palpable     Mammography:   Last mammogram: ***   Personally reviewed by me. Mammogram is unchanged from previous mammograms. Radiologist report is ***     Impression:   No evidence of systemic or recurrent breast cancer:   No evidence of second primary breast cancer     Plan: RTC ***  Will need a mammogram in***

## 2018-05-26 ENCOUNTER — Encounter: Attending: Surgery | Primary: Internal Medicine

## 2018-05-26 NOTE — Progress Notes (Deleted)
Mareli Antunes     Mylisa Brunson is here for cancer of the right Breast. DCIS  Procedure: lumpectomy Date: 03/2011   Hormonal Therapy:yes, anastrazole  Chemotherapy: no  Radiation Therapy: done    Current Evaluation:   Today the patient is basically feeling well. She denies new breast mass, nipple discharge, or breast pain. No new subcutaneous mass, headache, bone pain, new cough, or abdominal pain.     Physical Exam:   Generally healthy appearing, {Race/ethnicity:17218} female   Skin: no new subcutaneous mass   Breast: non-tender, no new mass   Lungs clear   Heart RRR  Lymphadenopathy: no new axillary and supraclavicular   Abdomen: non-tender without liver, spleen, kidney, or mass palpable     Mammography:   Last mammogram: ***   Personally reviewed by me. Mammogram is unchanged from previous mammograms. Radiologist report is ***     Impression:   No evidence of systemic or recurrent breast cancer:   No evidence of second primary breast cancer     Plan: RTC ***  Will need a mammogram in***

## 2018-06-01 ENCOUNTER — Inpatient Hospital Stay: Admit: 2018-06-01 | Payer: MEDICARE | Primary: Internal Medicine

## 2018-06-01 ENCOUNTER — Inpatient Hospital Stay: Payer: MEDICARE | Primary: Internal Medicine

## 2018-06-01 ENCOUNTER — Encounter

## 2018-06-01 DIAGNOSIS — Z1231 Encounter for screening mammogram for malignant neoplasm of breast: Secondary | ICD-10-CM

## 2018-06-02 ENCOUNTER — Ambulatory Visit: Admit: 2018-06-02 | Discharge: 2018-06-02 | Payer: MEDICARE | Attending: Registered Nurse | Primary: Internal Medicine

## 2018-06-02 DIAGNOSIS — Z1239 Encounter for other screening for malignant neoplasm of breast: Secondary | ICD-10-CM

## 2018-06-02 NOTE — Progress Notes (Signed)
Judy Velasquez     Judy Velasquez is here for cancer of the right Breast. DCIS  Procedure: lumpectomy Date: 03/2011   Hormonal Therapy:yes, anastrazole  Chemotherapy: no  Radiation Therapy: done    Current Evaluation:   Today the patient is basically feeling well. She denies new breast mass, nipple discharge, or breast pain. No new subcutaneous mass, headache, bone pain, new cough, or abdominal pain.     Physical Exam:   Generally healthy appearing, Caucasian female   Skin: no new subcutaneous mass   Breast: non-tender, no new mass   Lungs clear   Heart RRR  Lymphadenopathy: no new axillary and supraclavicular   Abdomen: non-tender without liver, spleen, kidney, or mass palpable     Mammography:   Last mammogram: 05/2018  Personally reviewed by me. Mammogram is unchanged from previous mammograms. Radiologist report is negative     Impression:   No evidence of systemic or recurrent breast cancer:   No evidence of second primary breast cancer     Plan: RTC 1 year  Will need a mammogram in a year  Recommend 2 Tums twice a day for her osteoporosis.

## 2018-06-16 ENCOUNTER — Inpatient Hospital Stay: Admit: 2018-06-16 | Payer: MEDICARE | Primary: Internal Medicine

## 2018-06-16 ENCOUNTER — Encounter

## 2018-06-16 DIAGNOSIS — M25362 Other instability, left knee: Secondary | ICD-10-CM

## 2018-12-10 ENCOUNTER — Inpatient Hospital Stay: Admit: 2018-12-10 | Discharge: 2018-12-10 | Disposition: A | Discharge status: Home / Self Care | Payer: MEDICARE

## 2018-12-10 ENCOUNTER — Encounter

## 2018-12-10 ENCOUNTER — Inpatient Hospital Stay: Admit: 2018-12-10 | Payer: MEDICARE | Primary: Internal Medicine

## 2018-12-10 DIAGNOSIS — N289 Disorder of kidney and ureter, unspecified: Secondary | ICD-10-CM

## 2018-12-10 DIAGNOSIS — T82838A Hemorrhage of vascular prosthetic devices, implants and grafts, initial encounter: Secondary | ICD-10-CM

## 2018-12-10 LAB — CBC WITH AUTO DIFFERENTIAL
Basophils %: 0.6 % (ref 0–1)
Basophils Absolute: 0 10*3/uL
Eosinophils %: 2.3 % (ref 0–3)
Eosinophils Absolute: 0.2 10*3/uL
Hematocrit: 36.6 % — ABNORMAL LOW (ref 37–47)
Hemoglobin: 11.4 GM/DL — ABNORMAL LOW (ref 12.5–16.0)
Immature Neutrophil %: 0.5 % — ABNORMAL HIGH (ref 0–0.43)
Lymphocytes %: 16.6 % — ABNORMAL LOW (ref 24–44)
Lymphocytes Absolute: 1.1 10*3/uL
MCH: 33.7 PG — ABNORMAL HIGH (ref 27–31)
MCHC: 31.1 % — ABNORMAL LOW (ref 32.0–36.0)
MCV: 108.3 FL — ABNORMAL HIGH (ref 78–100)
MPV: 10.3 FL (ref 7.5–11.1)
Monocytes %: 8.1 % — ABNORMAL HIGH (ref 0–4)
Monocytes Absolute: 0.5 10*3/uL
Neutrophils Absolute: 4.6 10*3/uL
Nucleated RBC %: 0 %
Platelets: 163 10*3/uL (ref 140–440)
RBC: 3.38 10*6/uL — ABNORMAL LOW (ref 4.2–5.4)
RDW: 14.1 % (ref 11.7–14.9)
Segs Relative: 71.9 % — ABNORMAL HIGH (ref 36–66)
Total Immature Neutrophil: 0.03 10*3/uL
Total Nucleated RBC: 0 10*3/uL
WBC: 6.4 10*3/uL (ref 4.0–10.5)

## 2018-12-10 LAB — COMPREHENSIVE METABOLIC PANEL
ALT: <5 U/L — ABNORMAL LOW (ref 10–40)
AST: 14 IU/L — ABNORMAL LOW (ref 15–37)
Albumin: 2.8 GM/DL — ABNORMAL LOW (ref 3.4–5.0)
Alkaline Phosphatase: 77 IU/L (ref 40–129)
Anion Gap: 9 (ref 4–16)
BUN: 23 MG/DL (ref 6–23)
CO2: 23 MMOL/L (ref 21–32)
Calcium: 7.9 MG/DL — ABNORMAL LOW (ref 8.3–10.6)
Chloride: 98 mMol/L — ABNORMAL LOW (ref 99–110)
Creatinine: 3.4 MG/DL — ABNORMAL HIGH (ref 0.6–1.1)
GFR African American: 15 mL/min/{1.73_m2} — ABNORMAL LOW (ref >60)
GFR Non-African American: 13 mL/min/{1.73_m2} — ABNORMAL LOW (ref >60)
Glucose: 107 MG/DL — ABNORMAL HIGH (ref 70–99)
Potassium: 4.8 MMOL/L (ref 3.5–5.1)
Sodium: 130 MMOL/L — ABNORMAL LOW (ref 135–145)
Total Bilirubin: 0.5 MG/DL (ref 0.0–1.0)
Total Protein: 5 GM/DL — ABNORMAL LOW (ref 6.4–8.2)

## 2018-12-10 LAB — PROTIME/INR & PTT
INR: 1.03 INDEX
Protime: 12.5 SECONDS (ref 11.7–14.5)
aPTT: 48.3 SECONDS — ABNORMAL HIGH (ref 25.1–37.1)

## 2018-12-10 MED ORDER — CARVEDILOL 6.25 MG PO TABS
6.25 MG | Freq: Two times a day (BID) | ORAL | Status: DC
Start: 2018-12-10 — End: 2018-12-11
  Administered 2018-12-10: 13:00:00 3.125 mg via ORAL

## 2018-12-10 MED ORDER — HYDRALAZINE HCL 50 MG PO TABS
50 MG | Freq: Three times a day (TID) | ORAL | Status: DC
Start: 2018-12-10 — End: 2018-12-11
  Administered 2018-12-10: 13:00:00 100 mg via ORAL

## 2018-12-10 MED ORDER — CLONIDINE HCL 0.1 MG PO TABS
0.1 MG | Freq: Two times a day (BID) | ORAL | Status: DC
Start: 2018-12-10 — End: 2018-12-11
  Administered 2018-12-10: 14:00:00 0.1 mg via ORAL

## 2018-12-10 MED ORDER — NORMAL SALINE FLUSH 0.9 % IV SOLN
0.9 % | Freq: Two times a day (BID) | INTRAVENOUS | Status: DC
Start: 2018-12-10 — End: 2018-12-11
  Administered 2018-12-10: 12:00:00 10 mL via INTRAVENOUS

## 2018-12-10 MED ORDER — NIFEDIPINE ER OSMOTIC RELEASE 30 MG PO TB24
30 MG | Freq: Every day | ORAL | Status: DC
Start: 2018-12-10 — End: 2018-12-11
  Administered 2018-12-10: 13:00:00 30 mg via ORAL

## 2018-12-10 MED FILL — CARVEDILOL 6.25 MG PO TABS: 6.25 mg | ORAL | Qty: 1

## 2018-12-10 MED FILL — NIFEDIPINE ER OSMOTIC RELEASE 30 MG PO TB24: 30 mg | ORAL | Qty: 1

## 2018-12-10 MED FILL — HYDRALAZINE HCL 50 MG PO TABS: 50 mg | ORAL | Qty: 2

## 2018-12-10 MED FILL — CLONIDINE HCL 0.1 MG PO TABS: 0.1 mg | ORAL | Qty: 1

## 2018-12-10 NOTE — Progress Notes (Signed)
Attempted phone assessment- rings and rings then msg line comes on leave remote access code- unable to leave a msg

## 2018-12-10 NOTE — Progress Notes (Signed)
12/08/18 - Unable to LM on patient's phone; LM on Judy Velasquez [facility]) with times and to have patient call the PAT Nurse.

## 2018-12-10 NOTE — Progress Notes (Signed)
Pt's dressing saturated w/blood & changed using sterile technique. Spoke w/Barbie who stated she would let Dr Laruth Bouchard know.

## 2018-12-10 NOTE — ED Provider Notes (Signed)
Patient Identification  Judy Velasquez is a 85 y.o. female    Chief Complaint  Other (bleeding at new dialysis port on R chest)      HPI  (History provided by patient)  This is a 85 y.o. female who was brought in by self for chief complaint of bleeding from dialysis port.  Patient had new temporary dialysis port placed by Dr. Laruth Bouchard today, states it has been bleeding slowly since placement so came here.  Reports mild pain over insertion site.  Denies feeling lightheaded or weak, no fatigue, no shortness of breath or CP.  She is not on blood thinners.      REVIEW OF SYSTEMS    Constitutional:  Denies fever, chills  HENT:  Denies sore throat or ear pain   Eyes: Denies vision changes, eye pain  Cardiovascular:  Denies chest pain, syncope  Respiratory:  Denies shortness of breath, cough   GI:  Denies abdominal pain, nausea, vomiting  GU:  Denies dysuria, discharge  Musculoskeletal:  Denies back pain, joint pain  Skin:  Denies rash, pruritis  Neurologic:  Denies headache, focal weakness, or sensory changes     See HPI and nursing notes for additional information     I have reviewed the following nursing documentation:  Allergies:   Allergies   Allergen Reactions   ??? Celebrex [Celecoxib] Shortness Of Breath, Swelling and Anaphylaxis     "Swelling Of The Esophagus"   ??? Fish Allergy Anaphylaxis   ??? Lisinopril Other (See Comments)     Dry Mouth   ??? Neurontin [Gabapentin] Other (See Comments)     Dizziness   ??? Norvasc [Amlodipine Besylate] Other (See Comments)     Edema Of Legs   ??? Other      "Allergic To Certain Seafoods Causing Headaches, Difficulty Breathing"   ??? Shellfish Allergy Anaphylaxis   ??? Ciprofloxacin Rash   ??? Lipitor Other (See Comments)     Ache   ??? Losartan      Per nephrologist       Past medical history:  has a past medical history of Allergic rhinitis, Anemia, Anesthesia, Anxiety, Arthritis, Back problem, Blood transfusion, CAD (coronary artery disease) (2008), DCIS (ductal carcinoma in situ) (05/20/2011),  Glaucoma, H/O cardiovascular stress test (05/20/11), Heart attack ([place]) (2006), HOH (hard of hearing), Hyperlipidemia, Hypertension, IBS (irritable bowel syndrome), Kidney problem, Nausea & vomiting, Osteoarthritis, Osteoporosis, Right Breast Cancer (Dx 7-12), Shortness of breath on exertion, Staph infection (1980's), TB (tuberculosis), and Type II or unspecified type diabetes mellitus without mention of complication, not stated as uncontrolled (Dx 1980's).    Past surgical history:  has a past surgical history that includes Cesarean section ("Late 1950's or Early 1960's"); Carpal tunnel release (2000's); Breast surgery (7-12); joint replacement (1980's); joint replacement (2000's); joint replacement (1990's); joint replacement (1980's); other surgical history; Hysterectomy, total abdominal (1960's); Cardiac surgery (2008); other surgical history (2000's); Colonoscopy (2000's); Endoscopy, colon, diagnostic (1990's); Tonsillectomy and adenoidectomy (1939); Cholecystectomy, laparoscopic (1990's); Breast lumpectomy (05/20/2011); Coronary artery bypass graft (2008); Upper gastrointestinal endoscopy (N/A, 09/18/2017); Upper gastrointestinal endoscopy (N/A, 02/28/2018); Colonoscopy (N/A, 03/04/2018); Colonoscopy (N/A, 03/05/2018); and Enteroscopy (N/A, 03/06/2018).    Home medications:   Prior to Admission medications    Medication Sig Start Date End Date Taking? Authorizing Provider   pantoprazole (PROTONIX) 40 MG tablet Take 1 tablet by mouth every morning (before breakfast) 03/09/18   Bailey Mech, MD   acetaminophen (TYLENOL) 500 MG tablet Take 1,000 mg by mouth    Historical Provider,  MD   ondansetron (ZOFRAN-ODT) 8 MG TBDP disintegrating tablet  01/26/18   Historical Provider, MD   iron polysaccharides (FERREX 150) 150 MG capsule Take 150 mg by mouth 12/22/17   Historical Provider, MD   HYDROcodone-acetaminophen (NORCO) 5-325 MG per tablet Take 1 tablet by mouth every 6 hours as needed for Pain.    Historical Provider, MD    furosemide (LASIX) 40 MG tablet Take 40 mg by mouth 2 times daily    Historical Provider, MD   isosorbide dinitrate (ISORDIL) 30 MG tablet Take 1 tablet by mouth 3 times daily 09/26/17   Vernie Ammons, MD   hydrALAZINE (APRESOLINE) 100 MG tablet Take 1 tablet by mouth 3 times daily 09/26/17   Vernie Ammons, MD   carvedilol (COREG) 3.125 MG tablet Take 1 tablet by mouth 2 times daily 09/26/17   Vernie Ammons, MD   NIFEdipine (ADALAT CC) 30 MG extended release tablet Take 1 tablet by mouth 2 times daily 09/26/17   Vernie Ammons, MD   docusate sodium (PHILLIPS STOOL SOFTENER) 100 MG capsule Take 1 capsule by mouth 2 times daily 09/26/17   Vernie Ammons, MD       Social history:  reports that she has never smoked. She has never used smokeless tobacco. She reports that she does not drink alcohol or use drugs.    Family history:    Family History   Problem Relation Age of Onset   ??? Heart Disease Mother    ??? Cancer Mother         "Cancer In Her Eye"   ??? Depression Mother    ??? Mental Illness Mother    ??? Diabetes Father    ??? Heart Disease Father         "Heart Attack"   ??? Hearing Loss Father    ??? Cancer Sister         "Breast Cancer"   ??? Early Death Brother         "At [employer]"   ??? Diabetes Sister    ??? Vision Loss Son         "Eye Problems"   ??? Other Son         "Alot of health problems due to motorcycle wreck"   ??? Arthritis Daughter    ??? Early Death Son         "Early 63's"         Exam  BP (!) 174/89    Pulse 72    Temp 97.8 ??F (36.6 ??C) (Oral)    Resp 19    SpO2 96%   Nursing note and vitals reviewed.  Constitutional: Well developed, well nourished. No acute distress.    HENT:      Head: Normocephalic and atraumatic.      Ears: External ears normal.      Nose: Nose normal.     Mouth: masked  Eyes: Anicteric sclera. No discharge, PERRL  Neck: Supple. Trachea midline.   Cardiovascular: RRR, no murmurs, rubs, or gallops, radial pulses 2+ bilaterally.  Pulmonary/Chest: Effort normal. No respiratory distress. CTAB. No stridor. No wheezes. No  rales.   Abdominal: Soft. Nontender to palpation. No distension.  No guarding, rebound tenderness, or evidence of ascites.    GU: No CVA tenderness.     Musculoskeletal: Moves all extremities. No gross deformity.    Neurological: Alert and oriented to person, place, and time. Normal muscle tone.      Skin: Warm and dry.  Dressing noted  in place over right upper chest temporary dialysis catheter.  There is a moderate amount of blood noted under dressing but no blood escaping the dressing currently.  Bandages removed, no active bleeding noted, dressing replaced by Lorene Dy.  Psychiatric: Normal mood and affect. Behavior is normal.      Radiographs (if obtained):  []  The following radiograph was interpreted by myself in the absence of a radiologist:   [x]  Radiologist's Report Reviewed:  No orders to display          Labs  Results for orders placed or performed during the hospital encounter of 12/10/18   CBC Auto Differential   Result Value Ref Range    WBC 6.4 4.0 - 10.5 K/CU MM    RBC 3.38 (L) 4.2 - 5.4 M/CU MM    Hemoglobin 11.4 (L) 12.5 - 16.0 GM/DL    Hematocrit 36.6 (L) 37 - 47 %    MCV 108.3 (H) 78 - 100 FL    MCH 33.7 (H) 27 - 31 PG    MCHC 31.1 (L) 32.0 - 36.0 %    RDW 14.1 11.7 - 14.9 %    Platelets 163 140 - 440 K/CU MM    MPV 10.3 7.5 - 11.1 FL    Differential Type AUTOMATED DIFFERENTIAL     Segs Relative 71.9 (H) 36 - 66 %    Lymphocytes % 16.6 (L) 24 - 44 %    Monocytes % 8.1 (H) 0 - 4 %    Eosinophils % 2.3 0 - 3 %    Basophils % 0.6 0 - 1 %    Segs Absolute 4.6 K/CU MM    Lymphocytes Absolute 1.1 K/CU MM    Monocytes Absolute 0.5 K/CU MM    Eosinophils Absolute 0.2 K/CU MM    Basophils Absolute 0.0 K/CU MM    Nucleated RBC % 0.0 %    Total Nucleated RBC 0.0 K/CU MM    Total Immature Neutrophil 0.03 K/CU MM    Immature Neutrophil % 0.5 (H) 0 - 0.43 %   CMP   Result Value Ref Range    Sodium 130 (L) 135 - 145 MMOL/L    Potassium 4.8 3.5 - 5.1 MMOL/L    Chloride 98 (L) 99 - 110 mMol/L    CO2 23 21 - 32  MMOL/L    BUN 23 6 - 23 MG/DL    CREATININE 3.4 (H) 0.6 - 1.1 MG/DL    Glucose 107 (H) 70 - 99 MG/DL    Calcium 7.9 (L) 8.3 - 10.6 MG/DL    Alb 2.8 (L) 3.4 - 5.0 GM/DL    Total Protein 5.0 (L) 6.4 - 8.2 GM/DL    Total Bilirubin 0.5 0.0 - 1.0 MG/DL    ALT <5 (L) 10 - 40 U/L    AST 14 (L) 15 - 37 IU/L    Alkaline Phosphatase 77 40 - 129 IU/L    GFR Non-African American 13 (L) >60 mL/min/1.66m2    GFR African American 15 (L) >60 mL/min/1.15m2    Anion Gap 9 4 - 16   Protime/INR & PTT   Result Value Ref Range    Protime 12.5 11.7 - 14.5 SECONDS    INR 1.03 INDEX    aPTT 48.3 (H) 25.1 - 37.1 SECONDS         MDM  Patient presents for bleeding from newly placed dialysis catheter.  Bandage was removed, no bleeding noted now.  It was replaced under sterile  technique by Lorene Dy.  Patient does have mild anemia, given that bleeding is ceased I do not believe this requires any treatment at this time.  Chemistry panel is at baseline per patient.  Plan is to discharge.  Follow-up with PCP in 2 to 3 days for recheck.  Return to ED for any new or worsening symptoms.    I have independently evaluated this patient.    Final Impression  1. Bleeding due to dialysis catheter placement, initial encounter ([place])        Blood pressure (!) 174/89, pulse 72, temperature 97.8 ??F (36.6 ??C), temperature source Oral, resp. rate 19, SpO2 96 %, not currently breastfeeding.     Disposition:  Discharge to home in stable condition.        Patient was given scripts for the following medications. I counseled patient how to take these medications.     Discharge Medication List as of 12/10/2018  3:03 PM          This chart was generated using the [employer] dictation system. I created this record but it may contain dictation errors given the limitations of this technology.       Christian Mate Braylin Xu, PA-C  12/10/18 1531

## 2018-12-10 NOTE — Discharge Instructions (Signed)
RE CHECK BLOOD PRESSURE WHEN PATIENT GETS HOME    CALL RESULTS TO DR Maretta Bees @ OFFICE @ [phone]

## 2018-12-10 NOTE — ED Notes (Signed)
Complete dressing change completed without incident or any furthing bleeding.     Thurnell Lose, RN  12/10/18 [phone]

## 2018-12-10 NOTE — Progress Notes (Addendum)
Pt arrived to IR for an exchange of her tunneled dialysis catheter. Pt A&O, verbalizes understanding of procedure. Pt assisted to the table, prepped and draped to the right neck. Dr Laruth Bouchard at bedside.     3614 Time Out   0925 Procedure started  0930 Pt tolerating procedure well  0934 Tunneled catheters exchanged by Dr Laruth Bouchard  Dual lumen catheter 14.5 fr x 23 cm placed by Dr Laruth Bouchard.  0940 Catheter sutured in place, heparin to each lumen, biopatch, sterile dressing and pressure dressing applied by [facility]  0945 Procedure complete. Pt tolerated procedure well.     Report given bedside SDS to Almyra Free, [place]

## 2018-12-10 NOTE — H&P (Signed)
Date:12/10/2018  Name:Judy Velasquez   DOB:[birth year]   XW#:[id number]    XBJ:[id number]   Referring Physician:  Dr. Karyl Kinnier  Chief Complaint:  Difficulty during dialysis  History of Present Illness:   Patient with ESRD presents for a [facility] exchange due to difficulty with line during dialysis. Line was placed 02/27/2018.    HISTORY AND PHYSICAL  Renal insufficiency [N28.9]    Past Medical History:  Past Medical History:   Diagnosis Date   ??? Allergic rhinitis    ??? Anemia    ??? Anesthesia     Nausea/Vomiting Post Op In Past   ??? Anxiety    ??? Arthritis     s/p bilateral hip and right knee replacement sees Dr. Chriss Czar   ??? Back problem     "Occ Back Hurts"   ??? Blood transfusion    ??? CAD (coronary artery disease) 2008    Sees Dr. Ta Fair Loosen CABG x 3   ??? DCIS (ductal carcinoma in situ) 05/20/2011   ??? Glaucoma     Bilateral Eyes   ??? H/O cardiovascular stress test 05/20/11   ??? Heart attack ([place]) 2006   ??? HOH (hard of hearing)     Bilateral Ears   ??? Hyperlipidemia    ??? Hypertension    ??? IBS (irritable bowel syndrome)    ??? Kidney problem     "Sees  Dr. Maretta Bees For Blood Pressure And Kidneys"   ??? Nausea & vomiting     Nausea/Vomiting Post Op In Past   ??? Osteoarthritis    ??? Osteoporosis    ??? Right Breast Cancer Dx 7-12   ??? Shortness of breath on exertion    ??? Staph infection 1980's    "They sent me to a tropical infection doctor"   ??? TB (tuberculosis)     "Probably had it as a child, my dad had it"   ??? Type II or unspecified type diabetes mellitus without mention of complication, not stated as uncontrolled Dx 1980's       Past Surgical History:  Past Surgical History:   Procedure Laterality Date   ??? BREAST LUMPECTOMY  05/20/2011    Right   ??? BREAST SURGERY  7-12    Right Breast Biopsy   ??? CARDIAC SURGERY  2008    CABG (3 Bypasses)   ??? CARPAL TUNNEL RELEASE  2000's    Right   ??? CESAREAN SECTION  "Late 1950's or Early 1960's"    X 3   ??? CHOLECYSTECTOMY, LAPAROSCOPIC  1990's   ??? COLONOSCOPY  2000's   ??? COLONOSCOPY N/A 03/04/2018    COLONOSCOPY  INCOMPLETE DUE TO POOR PREP performed by [name], MD at [place]   ??? COLONOSCOPY N/A 03/05/2018    COLONOSCOPY CONTROL HEMORRHAGE/ COLON POLYP REMOVAL COLD SNARE/ SIGMOID DIVERTICULITIS/ GRADE 1 INTERNAL HEMORRHOIDS performed by [name], MD at [place]   ??? CORONARY ARTERY BYPASS GRAFT  2008    cabg x 3   ??? ENDOSCOPY, COLON, DIAGNOSTIC  1990's   ??? ENTEROSCOPY N/A 03/06/2018    ENTEROSCOPY PUSH CONTROL HEMORRHAGE WITH CLIP APPLICATION AND CAUTERIZATION performed by [name], MD at [facility] ENDOSCOPY   ??? HYSTERECTOMY, TOTAL ABDOMINAL  1960's   ??? JOINT REPLACEMENT  1980's    Total Right Hip   ??? JOINT REPLACEMENT  2000's    Total Right Hip   ??? JOINT REPLACEMENT  1990's    Total Left Hip   ??? JOINT REPLACEMENT  1980's  Total Right Knee   ??? OTHER SURGICAL HISTORY      Family Physician Is Dr. Evelina Bucy In [place], [place]   ??? OTHER SURGICAL HISTORY  2000's    "Took section of my intestines out, it was a exploratory operation, they found scar tissue from the c-sections"   ??? [place]   ??? UPPER GASTROINTESTINAL ENDOSCOPY N/A 09/18/2017    EGD BIOPSY performed by [name], MD at [place]   ??? UPPER GASTROINTESTINAL ENDOSCOPY N/A 02/28/2018    EGD DIAGNOSTIC ONLY performed by Barbette Merino, MD at [place] History:  Social History     Socioeconomic History   ??? Marital status: Widowed     Spouse name: Not on file   ??? Number of children: Not on file   ??? Years of education: Not on file   ??? Highest education level: Not on file   Occupational History   ??? Not on file   Social Needs   ??? Financial resource strain: Not on file   ??? Food insecurity     Worry: Not on file     Inability: Not on file   ??? Transportation needs     Medical: Not on file     Non-medical: Not on file   Tobacco Use   ??? Smoking status: Never Smoker   ??? Smokeless tobacco: Never Used   Substance and Sexual Activity   ??? Alcohol use: No   ??? Drug use: No   ??? Sexual activity: Never    Lifestyle   ??? Physical activity     Days per week: Not on file     Minutes per session: Not on file   ??? Stress: Not on file   Relationships   ??? Social Product manager on phone: Not on file     Gets together: Not on file     Attends religious service: Not on file     Active member of club or organization: Not on file     Attends meetings of clubs or organizations: Not on file     Relationship status: Not on file   ??? Intimate partner violence     Fear of current or ex partner: Not on file     Emotionally abused: Not on file     Physically abused: Not on file     Forced sexual activity: Not on file   Other Topics Concern   ??? Not on file   Social History Narrative    Do you donate blood or plasma? No    Caffeine intake? Moderate    Advance directive? No    Is blood transfusion acceptable in an emergency? Yes    Live alone or with others? With others    Sunscreen used routinely? No able to care for self? Yes               Family History:  Family History   Problem Relation Age of Onset   ??? Heart Disease Mother    ??? Cancer Mother         "Cancer In Her Eye"   ??? Depression Mother    ??? Mental Illness Mother    ??? Diabetes Father    ??? Heart Disease Father         "Heart Attack"   ??? Hearing Loss Father    ??? Cancer Sister         "  Breast Cancer"   ??? Early Death Brother         "At [employer]"   ??? Diabetes Sister    ??? Vision Loss Son         "Eye Problems"   ??? Other Son         "Alot of health problems due to motorcycle wreck"   ??? Arthritis Daughter    ??? Early Death Son         "Early 80's"       Allergies:  Allergies   Allergen Reactions   ??? Celebrex [Celecoxib] Shortness Of Breath, Swelling and Anaphylaxis     "Swelling Of The Esophagus"   ??? Fish Allergy Anaphylaxis   ??? Lisinopril Other (See Comments)     Dry Mouth   ??? Neurontin [Gabapentin] Other (See Comments)     Dizziness   ??? Norvasc [Amlodipine Besylate] Other (See Comments)     Edema Of Legs   ??? Other      "Allergic To Certain Seafoods Causing Headaches, Difficulty  Breathing"   ??? Shellfish Allergy Anaphylaxis   ??? Ciprofloxacin Rash   ??? Lipitor Other (See Comments)     Ache   ??? Losartan      Per nephrologist       Medications:  Current Outpatient Medications on File Prior to Encounter   Medication Sig Dispense Refill   ??? ondansetron (ZOFRAN-ODT) 8 MG TBDP disintegrating tablet   3   ??? iron polysaccharides (FERREX 150) 150 MG capsule Take 150 mg by mouth     ??? isosorbide dinitrate (ISORDIL) 30 MG tablet Take 1 tablet by mouth 3 times daily 120 tablet 3   ??? hydrALAZINE (APRESOLINE) 100 MG tablet Take 1 tablet by mouth 3 times daily 90 tablet 3   ??? carvedilol (COREG) 3.125 MG tablet Take 1 tablet by mouth 2 times daily 60 tablet 1   ??? NIFEdipine (ADALAT CC) 30 MG extended release tablet Take 1 tablet by mouth 2 times daily 60 tablet 3   ??? pantoprazole (PROTONIX) 40 MG tablet Take 1 tablet by mouth every morning (before breakfast) 30 tablet 3   ??? acetaminophen (TYLENOL) 500 MG tablet Take 1,000 mg by mouth     ??? HYDROcodone-acetaminophen (NORCO) 5-325 MG per tablet Take 1 tablet by mouth every 6 hours as needed for Pain.     ??? furosemide (LASIX) 40 MG tablet Take 40 mg by mouth 2 times daily     ??? docusate sodium (PHILLIPS STOOL SOFTENER) 100 MG capsule Take 1 capsule by mouth 2 times daily 60 capsule 1     No current facility-administered medications on file prior to encounter.        Vital Signs:  @FLOWDT (6:last)@ @FLOWSTATM (6:24)@ @FLOWDT (5:last)@ @FLOWDT (8:last)@ @FLOWDT (9:last)@ @FLOWDT (10:last)@   @FLOWDT (14:first)@  @FLOWDT (14:last)@  Body mass index is 20.63 kg/m??.    Laboratory:  No results for input(s): WBC, HEMOGLOBIN, NA, CL, CO2, BUN, CREATININE, GLUCOSE, INR, PTT, CKMB in the last 72 hours.    Invalid input(s): HEMATOCRIT, PLATELETS, POTASSIUM, CA, PT, CK1, TROP  INR @LABR24 (INR)@    Physical Exam:  GENERAL:Well developed, well nourished in NAD      Impression:  Active Problems:    * No active hospital problems. *  Resolved Problems:    * No resolved hospital  problems. *    TDC exchange      PLAN OF CARE/PLANNED PROCEDURE    IR REPLCE T CVC WO PORT SAME ACC [09326]

## 2018-12-10 NOTE — Progress Notes (Signed)
955 Patient rec'd from IR, soda and graham crackers provided  1002 notified IR bp 236-105 and 225/98, no orders rec'd  1004 notified Dr Karyl Kinnier. Orders rec'd.  6503 update provided to family  61 medicated for elevated bp as ordered.  1040 pt resting with eyes closed, family at bedside  33 assisted pt to dress. Dressing saturated, changed using sterile technique  1106 assisted to bathroom  1118 discharged to car via wheelchair

## 2018-12-10 NOTE — ED Notes (Signed)
.  Discharge instructions  given and reviewed. Signature obtained. Patient instructed to return if symptoms get worse or any new problems or discomforts arise. No further questions at this time.     Thurnell Lose, RN  12/10/18 [phone]

## 2019-08-01 ENCOUNTER — Emergency Department: Admit: 2019-08-01 | Payer: MEDICARE | Primary: Internal Medicine

## 2019-08-01 ENCOUNTER — Inpatient Hospital Stay
Admit: 2019-08-01 | Discharge: 2019-08-01 | Disposition: A | Discharge status: Home / Self Care | Payer: MEDICARE | Attending: Emergency Medicine

## 2019-08-01 DIAGNOSIS — S51012A Laceration without foreign body of left elbow, initial encounter: Secondary | ICD-10-CM

## 2019-08-01 MED ORDER — BACITRACIN ZINC 500 UNIT/GM EX OINT
500 UNIT/GM | Freq: Once | CUTANEOUS | Status: AC
Start: 2019-08-01 — End: 2019-08-01
  Administered 2019-08-01: 11:00:00 via TOPICAL

## 2019-08-01 MED FILL — BACITRACIN ZINC 500 UNIT/GM EX OINT: 500 UNIT/GM | CUTANEOUS | Qty: 1

## 2019-08-01 NOTE — ED Triage Notes (Signed)
Daughter found her on the floor on the other side of the room. C/O Lt elbow pain. Also has a skin tear to Lt elbow.

## 2019-08-01 NOTE — ED Notes (Signed)
Dr Jeanell Sparrow in to discuss xray result and POC.     Darrol Jump, RN  08/01/19 [phone]

## 2019-08-01 NOTE — ED Provider Notes (Signed)
The history is provided by the patient and a caregiver.   Elbow Problem   Location:  Elbow  Elbow location:  L elbow  Injury: yes    Mechanism of injury: fall    Fall:     Fall occurred:  In the bathroom    Impact surface:  [employer] of impact: elbow.    Entrapped after fall: no    Pain details:     Quality:  Aching  Foreign body present:  No foreign bodies  Tetanus status:  Up to date  Prior injury to area:  No  Relieved by:  Nothing  Worsened by:  Nothing  Ineffective treatments:  None tried  Associated symptoms: stiffness    Associated symptoms: no back pain, no decreased range of motion, no fatigue, no fever, no muscle weakness, no neck pain and no numbness        Review of Systems   Constitutional: Negative.  Negative for fatigue and fever.   HENT: Negative.    Eyes: Negative.    Respiratory: Negative.    Cardiovascular: Negative.    Gastrointestinal: Negative.    Genitourinary: Negative.    Musculoskeletal: Positive for stiffness. Negative for back pain and neck pain.   Skin: Negative.    Neurological: Negative.    All other systems reviewed and are negative.      Family History   Problem Relation Age of Onset   ??? Heart Disease Mother    ??? Cancer Mother         "Cancer In Her Eye"   ??? Depression Mother    ??? Mental Illness Mother    ??? Diabetes Father    ??? Heart Disease Father         "Heart Attack"   ??? Hearing Loss Father    ??? Cancer Sister         "Breast Cancer"   ??? Early Death Brother         "At [employer]"   ??? Diabetes Sister    ??? Vision Loss Son         "Eye Problems"   ??? Other Son         "Alot of health problems due to motorcycle wreck"   ??? Arthritis Daughter    ??? Early Death Son         "Early 12's"     Social History     Socioeconomic History   ??? Marital status: Widowed     Spouse name: Not on file   ??? Number of children: Not on file   ??? Years of education: Not on file   ??? Highest education level: Not on file   Occupational History   ??? Not on file   Social Needs   ??? Financial resource strain: Not  on file   ??? Food insecurity     Worry: Not on file     Inability: Not on file   ??? Transportation needs     Medical: Not on file     Non-medical: Not on file   Tobacco Use   ??? Smoking status: Never Smoker   ??? Smokeless tobacco: Never Used   Substance and Sexual Activity   ??? Alcohol use: No   ??? Drug use: No   ??? Sexual activity: Never   Lifestyle   ??? Physical activity     Days per week: Not on file     Minutes per session: Not on file   ???  Stress: Not on file   Relationships   ??? Social Product manager on phone: Not on file     Gets together: Not on file     Attends religious service: Not on file     Active member of club or organization: Not on file     Attends meetings of clubs or organizations: Not on file     Relationship status: Not on file   ??? Intimate partner violence     Fear of current or ex partner: Not on file     Emotionally abused: Not on file     Physically abused: Not on file     Forced sexual activity: Not on file   Other Topics Concern   ??? Not on file   Social History Narrative    Do you donate blood or plasma? No    Caffeine intake? Moderate    Advance directive? No    Is blood transfusion acceptable in an emergency? Yes    Live alone or with others? With others    Sunscreen used routinely? No able to care for self? Yes             Past Surgical History:   Procedure Laterality Date   ??? BREAST LUMPECTOMY  05/20/2011    Right   ??? BREAST SURGERY  7-12    Right Breast Biopsy   ??? CARDIAC SURGERY  2008    CABG (3 Bypasses)   ??? CARPAL TUNNEL RELEASE  2000's    Right   ??? CESAREAN SECTION  "Late 1950's or Early 1960's"    X 3   ??? CHOLECYSTECTOMY, LAPAROSCOPIC  1990's   ??? COLONOSCOPY  2000's   ??? COLONOSCOPY N/A 03/04/2018    COLONOSCOPY INCOMPLETE DUE TO POOR PREP performed by [name], MD at [place]   ??? COLONOSCOPY N/A 03/05/2018    COLONOSCOPY CONTROL HEMORRHAGE/ COLON POLYP REMOVAL COLD SNARE/ SIGMOID DIVERTICULITIS/ GRADE 1 INTERNAL HEMORRHOIDS performed by [name], MD at [place]    ??? CORONARY ARTERY BYPASS GRAFT  2008    cabg x 3   ??? ENDOSCOPY, COLON, DIAGNOSTIC  1990's   ??? ENTEROSCOPY N/A 03/06/2018    ENTEROSCOPY PUSH CONTROL HEMORRHAGE WITH CLIP APPLICATION AND CAUTERIZATION performed by [name], MD at [place]   ??? HYSTERECTOMY, TOTAL ABDOMINAL  1960's   ??? JOINT REPLACEMENT  1980's    Total Right Hip   ??? JOINT REPLACEMENT  2000's    Total Right Hip   ??? JOINT REPLACEMENT  1990's    Total Left Hip   ??? JOINT REPLACEMENT  1980's    Total Right Knee   ??? OTHER SURGICAL HISTORY      Family Physician Is Dr. Evelina Bucy In [place], [place]   ??? OTHER SURGICAL HISTORY  2000's    "Took section of my intestines out, it was a exploratory operation, they found scar tissue from the c-sections"   ??? [place]   ??? UPPER GASTROINTESTINAL ENDOSCOPY N/A 09/18/2017    EGD BIOPSY performed by [name], MD at [place]   ??? UPPER GASTROINTESTINAL ENDOSCOPY N/A 02/28/2018    EGD DIAGNOSTIC ONLY performed by Barbette Merino, MD at [facility] ENDOSCOPY     Past Medical History:   Diagnosis Date   ??? Allergic rhinitis    ??? Anemia    ??? Anesthesia     Nausea/Vomiting Post Op In Past   ??? Anxiety    ???  Arthritis     s/p bilateral hip and right knee replacement sees Dr. Chriss Czar   ??? Back problem     "Occ Back Hurts"   ??? Blood transfusion    ??? CAD (coronary artery disease) 2008    Sees Dr. Brandt Loosen CABG x 3   ??? DCIS (ductal carcinoma in situ) 05/20/2011   ??? Glaucoma     Bilateral Eyes   ??? H/O cardiovascular stress test 05/20/11   ??? Heart attack ([place]) 2006   ??? HOH (hard of hearing)     Bilateral Ears   ??? Hyperlipidemia    ??? Hypertension    ??? IBS (irritable bowel syndrome)    ??? Kidney problem     "Sees  Dr. Maretta Bees For Blood Pressure And Kidneys"   ??? Nausea & vomiting     Nausea/Vomiting Post Op In Past   ??? Osteoarthritis    ??? Osteoporosis    ??? Right Breast Cancer Dx 7-12   ??? Shortness of breath on exertion    ??? Staph infection 1980's    "They sent me to a tropical infection doctor"   ??? TB  (tuberculosis)     "Probably had it as a child, my dad had it"   ??? Type II or unspecified type diabetes mellitus without mention of complication, not stated as uncontrolled Dx 1980's     Allergies   Allergen Reactions   ??? Celebrex [Celecoxib] Shortness Of Breath, Swelling and Anaphylaxis     "Swelling Of The Esophagus"   ??? Fish Allergy Anaphylaxis   ??? Lisinopril Other (See Comments)     Dry Mouth   ??? Neurontin [Gabapentin] Other (See Comments)     Dizziness   ??? Norvasc [Amlodipine Besylate] Other (See Comments)     Edema Of Legs   ??? Other      "Allergic To Certain Seafoods Causing Headaches, Difficulty Breathing"   ??? Shellfish Allergy Anaphylaxis   ??? Ciprofloxacin Rash   ??? Lipitor Other (See Comments)     Ache   ??? Losartan      Per nephrologist     Prior to Admission medications    Medication Sig Start Date End Date Taking? Authorizing Provider   dronabinol (MARINOL) 2.5 MG capsule Take 2.5 mg by mouth 2 times daily (before meals).   Yes Historical Provider, MD   latanoprost (XALATAN) 0.005 % ophthalmic solution 1 drop nightly   Yes Historical Provider, MD   pantoprazole (PROTONIX) 40 MG tablet Take 1 tablet by mouth every morning (before breakfast) 03/09/18  Yes Bailey Mech, MD   acetaminophen (TYLENOL) 500 MG tablet Take 1,000 mg by mouth   Yes Historical Provider, MD   ondansetron (ZOFRAN-ODT) 8 MG TBDP disintegrating tablet  01/26/18  Yes Historical Provider, MD   iron polysaccharides (FERREX 150) 150 MG capsule Take 150 mg by mouth 12/22/17  Yes Historical Provider, MD   HYDROcodone-acetaminophen (NORCO) 5-325 MG per tablet Take 1 tablet by mouth every 6 hours as needed for Pain.   Yes Historical Provider, MD   furosemide (LASIX) 40 MG tablet Take 40 mg by mouth 2 times daily   Yes Historical Provider, MD   isosorbide dinitrate (ISORDIL) 30 MG tablet Take 1 tablet by mouth 3 times daily 09/26/17  Yes Zhineng Ronny Flurry, MD   hydrALAZINE (APRESOLINE) 100 MG tablet Take 1 tablet by mouth 3 times daily 09/26/17  Yes Zhineng  Ronny Flurry, MD   carvedilol (COREG) 3.125 MG tablet Take 1 tablet by mouth 2  times daily 09/26/17  Yes Zhineng Ronny Flurry, MD   NIFEdipine (ADALAT CC) 30 MG extended release tablet Take 1 tablet by mouth 2 times daily 09/26/17  Yes Zhineng Ronny Flurry, MD   docusate sodium (PHILLIPS STOOL SOFTENER) 100 MG capsule Take 1 capsule by mouth 2 times daily 09/26/17  Yes Zhineng Ronny Flurry, MD       BP (!) 184/72    Pulse 82    Temp 97.7 ??F (36.5 ??C) (Oral)    Resp 16    Ht 4\' 8"  (1.422 m)    Wt 89 lb (40.4 kg)    SpO2 95%    BMI 19.95 kg/m??     Physical Exam  Constitutional:       Appearance: She is well-developed.   HENT:      Head: Normocephalic and atraumatic.      Right Ear: External ear normal.      Left Ear: External ear normal.      Nose: Nose normal.   Eyes:      Conjunctiva/sclera: Conjunctivae normal.      Pupils: Pupils are equal, round, and reactive to light.   Neck:      Musculoskeletal: Normal range of motion and neck supple.   Cardiovascular:      Rate and Rhythm: Normal rate and regular rhythm.      Heart sounds: Normal heart sounds.   Pulmonary:      Effort: Pulmonary effort is normal.      Breath sounds: Normal breath sounds.   Abdominal:      General: Bowel sounds are normal.      Palpations: Abdomen is soft.   Musculoskeletal:      Left elbow: She exhibits swelling. She exhibits normal range of motion and no deformity. Tenderness found. Olecranon process tenderness noted.      Comments: 1 cm skin tear over elbow   Skin:     General: Skin is warm and dry.   Neurological:      Mental Status: She is alert and oriented to person, place, and time.      GCS: GCS eye subscore is 4. GCS verbal subscore is 5. GCS motor subscore is 6.   Psychiatric:         Behavior: Behavior normal.         Thought Content: Thought content normal.         Judgment: Judgment normal.         MDM:    No results found for this visit on 08/01/19.  XR ELBOW LEFT (MIN 3 VIEWS)   Final Result   Posterior elbow soft tissue swelling.  No underlying acute fracture or    malalignment.               Wound care and walking safety  My typical dicussion, presentation,and considerations for this patients' chief complaint, diagnosis, differential diagnosis, medications, medication use,  medication safety and medication interactions have been explained and outlined to this patient for thispatient encounter. I have stressed need for follow up and reexamination for this encounter and or return to the emergency department if any changes or any concern.      Final Impression    1. Skin tear of left elbow without complication, initial encounter              [place], DO  08/03/19 1857

## 2019-08-01 NOTE — ED Notes (Signed)
Discharge instructions reviewed with pt and Granddaughter and verbalizes understanding.     Darrol Jump, RN  08/01/19 [phone]

## 2019-09-03 ENCOUNTER — Inpatient Hospital Stay
Admit: 2019-09-03 | Discharge: 2019-09-03 | Disposition: A | Discharge status: Home / Self Care | Payer: MEDICARE | Attending: Emergency Medicine

## 2019-09-03 ENCOUNTER — Emergency Department: Payer: MEDICARE | Primary: Internal Medicine

## 2019-09-03 ENCOUNTER — Encounter

## 2019-09-03 DIAGNOSIS — R110 Nausea: Secondary | ICD-10-CM

## 2019-09-03 MED ORDER — ONDANSETRON 4 MG PO TBDP
4 MG | Freq: Once | ORAL | Status: DC
Start: 2019-09-03 — End: 2019-09-03

## 2019-09-03 MED ORDER — ONDANSETRON 4 MG PO TBDP
4 MG | ORAL_TABLET | Freq: Three times a day (TID) | ORAL | 0 refills | Status: AC | PRN
Start: 2019-09-03 — End: ?

## 2019-09-03 NOTE — ED Notes (Signed)
Pt told Dr. Starleen Blue that she will not stay. States that ( I refuse to let you do any lab work or any other tests. I just want my granddaughter). Granddaughter called. States (I will be there in 20-30 mins to get my grandmother. I can't make her do anything she doesn't want to do). Dr. Starleen Blue notified.      [name], RN  09/03/19 1058

## 2019-09-03 NOTE — ED Provider Notes (Signed)
Emergency Department Encounter    Patient: Judy Velasquez  MRN: [id number]  DOB: Jul 22, 1929  Date of Evaluation: 09/03/2019  ED Provider:  Mattison Stuckey    Triage Chief Complaint:   Emesis (onset one week ago. Sent from dialysis. No emesis today. ) and Diarrhea (onset one week ago. Had only one day. )      HOPI:  Judy Velasquez is a 85 y.o. female that presents to the emergency department from dialysis for evaluation of shortness of breath.  Upon presentation to the emergency department, patient reports that she is breathing well.  She reports an occasional cough, but she denies any production.  She is reporting more occasional nausea.  She has not thrown up since yesterday.  She had diarrhea earlier in the week, but this has resolved.  She denies any chest pain or discomfort.  She denies any fevers, chills, or sweats.  She denies any abdominal pain.  She does receive Monday, Wednesday, Friday dialysis and still produces urine.  She denies any dysuria.  Patient lives in a private home with her granddaughter who helps care for her.  Patient reports no other particular provocative or alleviating factors.    ROS - see HPI, below listed is current ROS at time of my eval:  CONSTITUTIONAL: No fevers, chills, or sweats.  EYES: No vision change, redness, drainage, or discharge.  HENT: No sore throat, runny nose, or earache.  No dental pain.  No painful swallowing.  RESPIRATORY: No difficulty breathing, cough, or sputum production.  CARDIOVASCULAR: No anginal-type chest pain, orthopnea, or edema.  GASTROINTESTINAL: No abdominal pain.  No diarrhea or constipation.  No hematemesis, hematochezia, or melena.  GENITOURINARY: No frequency, urgency, or dysuria.  No hematuria.  MUSCULOSKELETAL: No recent injury.  No neck, back, or extremity pain.  NEUROLOGICAL: No focal weakness, numbness, or tingling.  SKIN: No rashes or other lesions reported.  No yellowing of the skin.      Past Medical History:   Diagnosis Date   . Allergic rhinitis     . Anemia    . Anesthesia     Nausea/Vomiting Post Op In Past   . Anxiety    . Arthritis     s/p bilateral hip and right knee replacement sees Dr. Salomon Mast   . Back problem     "Occ Back Hurts"   . Blood transfusion    . CAD (coronary artery disease) 2008    Sees Dr. Marcelino Freestone CABG x 3   . DCIS (ductal carcinoma in situ) 05/20/2011   . Glaucoma     Bilateral Eyes   . H/O cardiovascular stress test 05/20/11   . Heart attack (HCC) 2006   . HOH (hard of hearing)     Bilateral Ears   . Hyperlipidemia    . Hypertension    . IBS (irritable bowel syndrome)    . Kidney problem     "Sees  Dr. Wallis Bamberg For Blood Pressure And Kidneys"   . Nausea & vomiting     Nausea/Vomiting Post Op In Past   . Osteoarthritis    . Osteoporosis    . Right Breast Cancer Dx 7-12   . Shortness of breath on exertion    . Staph infection 1980's    "They sent me to a tropical infection doctor"   . TB (tuberculosis)     "Probably had it as a child, my dad had it"   . Type II or unspecified type diabetes mellitus without mention of complication,  not stated as uncontrolled Dx 1980's     Past Surgical History:   Procedure Laterality Date   . BREAST LUMPECTOMY  05/20/2011    Right   . BREAST SURGERY  7-12    Right Breast Biopsy   . CARDIAC SURGERY  2008    CABG (3 Bypasses)   . CARPAL TUNNEL RELEASE  2000's    Right   . CESAREAN SECTION  "Late 1950's or Early 1960's"    X 3   . CHOLECYSTECTOMY, LAPAROSCOPIC  1990's   . COLONOSCOPY  2000's   . COLONOSCOPY N/A 03/04/2018    COLONOSCOPY INCOMPLETE DUE TO POOR PREP performed by Roylene Reason, MD at [facility] ENDOSCOPY   . COLONOSCOPY N/A 03/05/2018    COLONOSCOPY CONTROL HEMORRHAGE/ COLON POLYP REMOVAL COLD SNARE/ SIGMOID DIVERTICULITIS/ GRADE 1 INTERNAL HEMORRHOIDS performed by Roylene Reason, MD at [facility] ENDOSCOPY   . CORONARY ARTERY BYPASS GRAFT  2008    cabg x 3   . ENDOSCOPY, COLON, DIAGNOSTIC  1990's   . ENTEROSCOPY N/A 03/06/2018    ENTEROSCOPY PUSH CONTROL HEMORRHAGE WITH CLIP APPLICATION AND CAUTERIZATION  performed by Roylene Reason, MD at [facility] ENDOSCOPY   . HYSTERECTOMY, TOTAL ABDOMINAL  1960's   . JOINT REPLACEMENT  1980's    Total Right Hip   . JOINT REPLACEMENT  2000's    Total Right Hip   . JOINT REPLACEMENT  1990's    Total Left Hip   . JOINT REPLACEMENT  1980's    Total Right Knee   . OTHER SURGICAL HISTORY      Family Physician Is Dr. Berneice Heinrich In [place], [place]   . OTHER SURGICAL HISTORY  2000's    "Took section of my intestines out, it was a exploratory operation, they found scar tissue from the c-sections"   . TONSILLECTOMY AND ADENOIDECTOMY  1939   . UPPER GASTROINTESTINAL ENDOSCOPY N/A 09/18/2017    EGD BIOPSY performed by Roylene Reason, MD at [facility] ENDOSCOPY   . UPPER GASTROINTESTINAL ENDOSCOPY N/A 02/28/2018    EGD DIAGNOSTIC ONLY performed by Marvis Repress, MD at [facility] ENDOSCOPY     Family History   Problem Relation Age of Onset   . Heart Disease Mother    . Cancer Mother         "Cancer In Her Eye"   . Depression Mother    . Mental Illness Mother    . Diabetes Father    . Heart Disease Father         "Heart Attack"   . Hearing Loss Father    . Cancer Sister         "Breast Cancer"   . Early Death Brother         "At [employer]   . Diabetes Sister    . Vision Loss Son         "Eye Problems"   . Other Son         "Alot of health problems due to motorcycle wreck"   . Arthritis Daughter    . Early Death Son         "Early 39's"     Social History     Socioeconomic History   . Marital status: Widowed     Spouse name: Not on file   . Number of children: Not on file   . Years of education: Not on file   . Highest education level: Not on file   Occupational History   .  Not on file   Social Needs   . Financial resource strain: Not on file   . Food insecurity     Worry: Not on file     Inability: Not on file   . Transportation needs     Medical: Not on file     Non-medical: Not on file   Tobacco Use   . Smoking status: Never Smoker   . Smokeless tobacco: Never Used   Substance and Sexual Activity   . Alcohol use: No    . Drug use: No   . Sexual activity: Never   Lifestyle   . Physical activity     Days per week: Not on file     Minutes per session: Not on file   . Stress: Not on file   Relationships   . Social Wellsite geologist on phone: Not on file     Gets together: Not on file     Attends religious service: Not on file     Active member of club or organization: Not on file     Attends meetings of clubs or organizations: Not on file     Relationship status: Not on file   . Intimate partner violence     Fear of current or ex partner: Not on file     Emotionally abused: Not on file     Physically abused: Not on file     Forced sexual activity: Not on file   Other Topics Concern   . Not on file   Social History Narrative    Do you donate blood or plasma? No    Caffeine intake? Moderate    Advance directive? No    Is blood transfusion acceptable in an emergency? Yes    Live alone or with others? With others    Sunscreen used routinely? No able to care for self? Yes             Current Facility-Administered Medications   Medication Dose Route Frequency Provider Last Rate Last Dose   . ondansetron (ZOFRAN-ODT) disintegrating tablet 4 mg  4 mg Oral Once Amanda Pea, MD         Current Outpatient Medications   Medication Sig Dispense Refill   . ondansetron (ZOFRAN-ODT) 4 MG disintegrating tablet Take 1 tablet by mouth 3 times daily as needed for Nausea or Vomiting 21 tablet 0   . dronabinol (MARINOL) 2.5 MG capsule Take 2.5 mg by mouth 2 times daily (before meals).     . latanoprost (XALATAN) 0.005 % ophthalmic solution 1 drop nightly     . pantoprazole (PROTONIX) 40 MG tablet Take 1 tablet by mouth every morning (before breakfast) 30 tablet 3   . acetaminophen (TYLENOL) 500 MG tablet Take 1,000 mg by mouth     . ondansetron (ZOFRAN-ODT) 8 MG TBDP disintegrating tablet   3   . iron polysaccharides (FERREX 150) 150 MG capsule Take 150 mg by mouth     . HYDROcodone-acetaminophen (NORCO) 5-325 MG per tablet Take 1 tablet by mouth  every 6 hours as needed for Pain.     . furosemide (LASIX) 40 MG tablet Take 40 mg by mouth 2 times daily     . isosorbide dinitrate (ISORDIL) 30 MG tablet Take 1 tablet by mouth 3 times daily 120 tablet 3   . hydrALAZINE (APRESOLINE) 100 MG tablet Take 1 tablet by mouth 3 times daily 90 tablet 3   . carvedilol (COREG) 3.125 MG  tablet Take 1 tablet by mouth 2 times daily 60 tablet 1   . NIFEdipine (ADALAT CC) 30 MG extended release tablet Take 1 tablet by mouth 2 times daily 60 tablet 3   . docusate sodium (PHILLIPS STOOL SOFTENER) 100 MG capsule Take 1 capsule by mouth 2 times daily 60 capsule 1     Allergies   Allergen Reactions   . Celebrex [Celecoxib] Shortness Of Breath, Swelling and Anaphylaxis     "Swelling Of The Esophagus"   . Fish Allergy Anaphylaxis   . Lisinopril Other (See Comments)     Dry Mouth   . Neurontin [Gabapentin] Other (See Comments)     Dizziness   . Norvasc [Amlodipine Besylate] Other (See Comments)     Edema Of Legs   . Other      "Allergic To Certain Seafoods Causing Headaches, Difficulty Breathing"   . Shellfish Allergy Anaphylaxis   . Ciprofloxacin Rash   . Lipitor Other (See Comments)     Ache   . Losartan      Per nephrologist       Nursing Notes Reviewed    Physical Exam:  Triage VS:    ED Triage Vitals   Enc Vitals Group      BP       Pulse       Resp       Temp       Temp src       SpO2       Weight       Height       Head Circumference       Peak Flow       Pain Score       Pain Loc       Pain Edu?       Excl. in GC?        My pulse ox interpretation is - normal    GENERAL: Patient is awake, alert, and oriented appropriately.  Patient is resting comfortably in a still position on the exam table.  Patient is hard of hearing but responds appropriately, speaking in full and complete sentences.  Generally well-nourished and well-developed.    HEENT: Normocephalic and atraumatic.  No midface, zygomatic, maxillary, or mandibular tenderness.  No dental malocclusion.  Pupils equal, round,  and reactive to light.  No redness or matting.  Bilateral external ears are unremarkable.  Tympanic membranes are pearly and gray without visible effusion or retraction.  Nasal mucosa is pink without purulence.  Oral mucosa is moist and pink.    NECK: Supple without Kernig's or Brudzinski signs.  No significant lymphadenopathy or limitation range of motion.  No midline spinal tenderness.    RESPIRATORY: Coarse lung sounds throughout.  No distress.  No other audible wheezes, rales, rhonchi, or stridor.  No chest wall tenderness.    CARDIOVASCULAR: Dialysis port noted in the right upper chest.  Regular rate and rhythm.  No audible murmurs, rubs, or gallops.  No central or peripheral cyanosis.    GASTROINTESTINAL: Soft, nontender, and nondistended.  No McBurney's or Murphy's point tenderness.  No guarding, rebound, rigidity.  No mass or pulsatile mass.  Bowel sounds are present in all quadrants.  No costovertebral angle tenderness.    NEUROLOGICAL: Cranial nerves III through XII are grossly intact as tested without facial droop or dermatomal paresthesias.  Of note, forehead wrinkles are symmetric and intact.  Conjugate gaze without entrapment.  No asymmetry of the corners of the mouth  or nasolabial folds.  No gross motor or cerebellar deficits.    MUSCULOSKELETAL: 1+ nonpitting lower extremity edema.  Hyperpigmentation of the lower extremities with no asymmetric edema, Denna Haggard' sign, or cords.  No tenderness or limitation range of motion to the bilateral shoulders, elbows, wrists, hips, knees, or ankles.  No accompanying long bone tenderness or deformity.    SKIN: Normal tone for ethnicity.  Normal turgor and brisk capillary refill peripherally.  No petechiae, purpura, vesicles, bullae, or other lesions.  No icterus.    PSYCHIATRIC: Normal mood.  Normal affect.  No voiced suicidal or homicidal ideation.  Patient does not respond to internal stimuli.    Emergency department course.  Patient is brought to bed Hall-2 and  assessed and reassessed by me.  Prior to initial evaluation, orders are placed for medical screening studies including CBC, metabolic panel, coagulation panel, troponin, and BNP among others.  EKG is pending.  After initial evaluation, two-view chest x-ray is ordered.  Patient reports no difficulty breathing at this time.  She appears in good spirits.  She has had multiple times already "I am not staying."  She is agreeable to the medical screening studies here in the emergency department.  Ondansetron 4 mg ODT is ordered.  Patient is generally cooperative with continuing plan.  As of approximately 1115, nursing indicates that the patient is refusing any and all testing.  According to the patient, she has no symptoms at this time.  She is breathing easily.  There has been no respiratory distress, hypoxia, or cyanosis.  There has been no vomiting here in the emergency department.  Lung sounds are somewhat coarse, but I suspect this is chronic due to her dialysis dependence.  There have been no recent fevers.  She denies any worsening cough or sputum production.  Patient has refused all testing including blood and imaging.  She is requesting discharge home.  We have discussed potential risks including undiagnosed infections, heart attack, disability, death.  Patient appears appropriately alert and oriented and is in no acute distress.  She appears to have the capacity to refuse.  She is invited to return at any time if she has any developing symptoms or concerns. We have discussed all available results.  Patient is satisfied with evaluation and agreeable to recommendations.  Patient has had the opportunity to ask questions, and they have been answered to the best of my ability.  Instructions are given to follow-up with primary care provider for reevaluation and further testing.  Very strict return and follow-up instructions are provided.      Patient seen during Covid19 Pandemic, I did don appropriate PPE during my  encounters with the patient, including n95 (when appropriate) mask and eye protection as appropriate.    I have reviewed and interpreted all of the currently available lab results from this visit (if applicable):  Refused by patient.    Radiographs (if obtained):  Radiologist's Report Reviewed:  Refused by patient.    EKG (if obtained): Refused by patient.    Medical decision making:  Discussed.    Procedures: None.    Consultations: None.    Clinical Impression:  1. Nausea    2. End stage renal disease on dialysis [facility])      Disposition referral (if applicable):  Gladstone Pih, MD  [address] [place] [address]  [place] [address]  [phone]    Schedule an appointment as soon as possible for a visit   And your nephrologist  for scheduled dialysis.    [facility] Emergency Department  [address]  [place] [address]  [phone]  Go to   As needed, If symptoms worsen    Disposition medications (if applicable):  New Prescriptions    ONDANSETRON (ZOFRAN-ODT) 4 MG DISINTEGRATING TABLET    Take 1 tablet by mouth 3 times daily as needed for Nausea or Vomiting     ED Provider Disposition Time  DISPOSITION Decision To Discharge 09/03/2019 11:19:01 AM      Comment: Please note this report has been produced using speech recognition software and may contain errors related to that system including errors in grammar, punctuation, and spelling, as well as words and phrases that may be inappropriate.  Efforts were made to edit the dictations.        Amanda Pea, MD  09/03/19 [phone]

## 2019-09-03 NOTE — Discharge Instructions (Signed)
Return at anytime for worsening or changing pain, fevers not improved with ibuprofen and acetaminophen, uncontrolled vomiting, chest or abdominal pain, difficulty breathing, weakness, numbness, tingling, unusual behavior, or any other problems or concerns that may occur.    Follow-up with your primary care doctor as discussed for a recheck and possible additional outpatient testing.      Call your primary care doctor or any specialist to arrange an appointment.  Please do not rely on the doctor's office to call you.    Continue your regularly prescribed medications.    We have discussed obtaining blood tests to check for any signs of infection, heart attack, or other disease. You have declined any further testing.  There are risks for undiagnosed disease such as heart attack, congestive heart failure, pneumonia, or high potassium.  Please return at any time if your symptoms return or if you have any other concerns.    Thank you for choosing [facility] to provide your emergency care.  You may receive a patient satisfaction survey and follow-up to your visit.  This is your time to provide [place] with genuine feedback that can better improve processes for the next patient.  We aim to provide our patients with safe and excellent care.  If you were satisfied with your care today, tell everyone.  If you were not satisfied with your care today, tell us while you are still here so that we can improve your experience.    Thank you,  Your Board-Certified Emergency Medicine Physician,  USACS    Dr. Lincoln Brigham, M. D.

## 2019-09-03 NOTE — ED Notes (Signed)
The patient has refused treatment, the family was called and the patient was taken to the car in a wheel chair. The patient was discharged with a coat and blanket.                   Rozetta Nunnery, RN  09/03/19 [phone]

## 2019-09-09 ENCOUNTER — Ambulatory Visit: Payer: MEDICARE | Primary: Internal Medicine

## 2019-09-16 MED ORDER — BASIS FACIAL MOISTURIZER EX CREA
CUTANEOUS | Status: DC | PRN
Start: 2019-09-16 — End: 2019-09-16

## 2019-09-16 MED ORDER — GENERIC EXTERNAL MEDICATION
Status: DC
Start: 2019-09-16 — End: 2019-09-16

## 2019-09-16 MED ORDER — POLYVINYL ALCOHOL-POVIDONE PF 1.4-0.6 % OP SOLN
OPHTHALMIC | Status: DC
Start: 2019-09-16 — End: 2019-09-16

## 2019-09-16 MED ORDER — LORAZEPAM 2 MG/ML IJ SOLN
2 | INTRAMUSCULAR | Status: DC
Start: 2019-09-16 — End: 2019-09-16

## 2019-09-16 MED ORDER — HYDROMORPHONE HCL 1 MG/ML IJ SOLN
1 | INTRAMUSCULAR | Status: DC
Start: 2019-09-16 — End: 2019-09-16

## 2019-09-16 MED ORDER — SALINE NASAL SPRAY 0.65 % NA SOLN
0.65 | NASAL | Status: DC
Start: 2019-09-16 — End: 2019-09-16

## 2019-09-16 MED ORDER — BIOTENE ORALBALANCE DRY MOUTH MT GEL
OROMUCOSAL | Status: DC
Start: 2019-09-16 — End: 2019-09-16

## 2019-09-16 MED ORDER — ZINC OXIDE 20 % EX OINT
20 | CUTANEOUS | Status: DC | PRN
Start: 2019-09-16 — End: 2019-09-16

## 2019-09-24 DEATH — deceased

## 2022-01-11 ENCOUNTER — Other Ambulatory Visit: Payer: Self-pay | Admitting: Nurse Practitioner
